# Patient Record
Sex: Female | Born: 1960 | Race: Black or African American | Hispanic: No | Marital: Married | Smoking: Never smoker
Health system: Southern US, Community
[De-identification: ages and names within clinical notes are randomized; demographics above are authoritative.]

## PROBLEM LIST (undated history)

## (undated) DIAGNOSIS — D649 Anemia, unspecified: Secondary | ICD-10-CM

## (undated) DIAGNOSIS — E559 Vitamin D deficiency, unspecified: Secondary | ICD-10-CM

## (undated) DIAGNOSIS — R0602 Shortness of breath: Secondary | ICD-10-CM

## (undated) DIAGNOSIS — R7303 Prediabetes: Secondary | ICD-10-CM

## (undated) DIAGNOSIS — Z8711 Personal history of peptic ulcer disease: Secondary | ICD-10-CM

## (undated) DIAGNOSIS — M549 Dorsalgia, unspecified: Secondary | ICD-10-CM

## (undated) DIAGNOSIS — T7840XA Allergy, unspecified, initial encounter: Secondary | ICD-10-CM

## (undated) DIAGNOSIS — M2550 Pain in unspecified joint: Secondary | ICD-10-CM

## (undated) DIAGNOSIS — E785 Hyperlipidemia, unspecified: Secondary | ICD-10-CM

## (undated) DIAGNOSIS — Z91018 Allergy to other foods: Secondary | ICD-10-CM

## (undated) DIAGNOSIS — F32A Depression, unspecified: Secondary | ICD-10-CM

## (undated) HISTORY — DX: Personal history of peptic ulcer disease: Z87.11

## (undated) HISTORY — PX: TUBAL LIGATION: SHX77

## (undated) HISTORY — DX: Allergy to other foods: Z91.018

## (undated) HISTORY — PX: COLONOSCOPY: SHX174

## (undated) HISTORY — DX: Prediabetes: R73.03

## (undated) HISTORY — PX: ENDOMETRIAL ABLATION: SHX621

## (undated) HISTORY — DX: Shortness of breath: R06.02

## (undated) HISTORY — DX: Allergy, unspecified, initial encounter: T78.40XA

## (undated) HISTORY — DX: Vitamin D deficiency, unspecified: E55.9

## (undated) HISTORY — PX: TONSILLECTOMY: SUR1361

## (undated) HISTORY — DX: Hyperlipidemia, unspecified: E78.5

## (undated) HISTORY — DX: Depression, unspecified: F32.A

## (undated) HISTORY — DX: Pain in unspecified joint: M25.50

---

## 1997-06-28 ENCOUNTER — Inpatient Hospital Stay (HOSPITAL_COMMUNITY): Admission: AD | Admit: 1997-06-28 | Discharge: 1997-06-28 | Payer: Self-pay | Admitting: Obstetrics & Gynecology

## 1997-12-09 ENCOUNTER — Inpatient Hospital Stay (HOSPITAL_COMMUNITY): Admission: AD | Admit: 1997-12-09 | Discharge: 1997-12-11 | Payer: Self-pay | Admitting: Obstetrics & Gynecology

## 1999-06-25 ENCOUNTER — Encounter: Admission: RE | Admit: 1999-06-25 | Discharge: 1999-07-02 | Payer: Self-pay | Admitting: Internal Medicine

## 2000-01-26 ENCOUNTER — Emergency Department (HOSPITAL_COMMUNITY): Admission: EM | Admit: 2000-01-26 | Discharge: 2000-01-26 | Payer: Self-pay | Admitting: Emergency Medicine

## 2000-10-31 ENCOUNTER — Inpatient Hospital Stay (HOSPITAL_COMMUNITY): Admission: AD | Admit: 2000-10-31 | Discharge: 2000-11-03 | Payer: Self-pay | Admitting: Obstetrics & Gynecology

## 2000-11-08 ENCOUNTER — Encounter: Admission: RE | Admit: 2000-11-08 | Discharge: 2000-12-08 | Payer: Self-pay | Admitting: Obstetrics and Gynecology

## 2001-02-28 ENCOUNTER — Other Ambulatory Visit: Admission: RE | Admit: 2001-02-28 | Discharge: 2001-02-28 | Payer: Self-pay | Admitting: Obstetrics & Gynecology

## 2001-05-16 ENCOUNTER — Emergency Department (HOSPITAL_COMMUNITY): Admission: EM | Admit: 2001-05-16 | Discharge: 2001-05-16 | Payer: Self-pay | Admitting: Emergency Medicine

## 2001-06-28 ENCOUNTER — Emergency Department (HOSPITAL_COMMUNITY): Admission: EM | Admit: 2001-06-28 | Discharge: 2001-06-28 | Payer: Self-pay

## 2001-06-29 ENCOUNTER — Emergency Department (HOSPITAL_COMMUNITY): Admission: EM | Admit: 2001-06-29 | Discharge: 2001-06-29 | Payer: Self-pay | Admitting: Emergency Medicine

## 2001-06-29 ENCOUNTER — Encounter: Payer: Self-pay | Admitting: Emergency Medicine

## 2001-09-19 ENCOUNTER — Encounter: Payer: Self-pay | Admitting: Orthopedic Surgery

## 2001-09-19 ENCOUNTER — Ambulatory Visit (HOSPITAL_COMMUNITY): Admission: RE | Admit: 2001-09-19 | Discharge: 2001-09-19 | Payer: Self-pay | Admitting: Orthopedic Surgery

## 2002-03-13 ENCOUNTER — Other Ambulatory Visit: Admission: RE | Admit: 2002-03-13 | Discharge: 2002-03-13 | Payer: Self-pay | Admitting: Obstetrics & Gynecology

## 2002-05-03 ENCOUNTER — Ambulatory Visit (HOSPITAL_COMMUNITY): Admission: RE | Admit: 2002-05-03 | Discharge: 2002-05-03 | Payer: Self-pay | Admitting: Obstetrics & Gynecology

## 2002-05-23 HISTORY — PX: BREAST REDUCTION SURGERY: SHX8

## 2003-03-19 ENCOUNTER — Other Ambulatory Visit: Admission: RE | Admit: 2003-03-19 | Discharge: 2003-03-19 | Payer: Self-pay | Admitting: Obstetrics & Gynecology

## 2004-03-19 ENCOUNTER — Other Ambulatory Visit: Admission: RE | Admit: 2004-03-19 | Discharge: 2004-03-19 | Payer: Self-pay | Admitting: Obstetrics & Gynecology

## 2004-09-06 ENCOUNTER — Ambulatory Visit (HOSPITAL_BASED_OUTPATIENT_CLINIC_OR_DEPARTMENT_OTHER): Admission: RE | Admit: 2004-09-06 | Discharge: 2004-09-06 | Payer: Self-pay | Admitting: Specialist

## 2004-09-06 ENCOUNTER — Ambulatory Visit (HOSPITAL_COMMUNITY): Admission: RE | Admit: 2004-09-06 | Discharge: 2004-09-06 | Payer: Self-pay | Admitting: Specialist

## 2005-04-29 ENCOUNTER — Other Ambulatory Visit: Admission: RE | Admit: 2005-04-29 | Discharge: 2005-04-29 | Payer: Self-pay | Admitting: Obstetrics & Gynecology

## 2005-05-23 HISTORY — PX: BREAST SURGERY: SHX581

## 2010-10-31 ENCOUNTER — Inpatient Hospital Stay (INDEPENDENT_AMBULATORY_CARE_PROVIDER_SITE_OTHER)
Admission: RE | Admit: 2010-10-31 | Discharge: 2010-10-31 | Disposition: A | Discharge status: Home / Self Care | Payer: 59 | Source: Ambulatory Visit | Attending: Family Medicine | Admitting: Family Medicine

## 2010-10-31 DIAGNOSIS — J069 Acute upper respiratory infection, unspecified: Secondary | ICD-10-CM

## 2010-10-31 DIAGNOSIS — H109 Unspecified conjunctivitis: Secondary | ICD-10-CM

## 2010-11-19 ENCOUNTER — Inpatient Hospital Stay (INDEPENDENT_AMBULATORY_CARE_PROVIDER_SITE_OTHER)
Admission: RE | Admit: 2010-11-19 | Discharge: 2010-11-19 | Disposition: A | Discharge status: Home / Self Care | Payer: 59 | Source: Ambulatory Visit | Attending: Emergency Medicine | Admitting: Emergency Medicine

## 2010-11-19 DIAGNOSIS — H209 Unspecified iridocyclitis: Secondary | ICD-10-CM

## 2011-04-19 ENCOUNTER — Emergency Department (INDEPENDENT_AMBULATORY_CARE_PROVIDER_SITE_OTHER)
Admission: EM | Admit: 2011-04-19 | Discharge: 2011-04-19 | Disposition: A | Discharge status: Home / Self Care | Payer: 59 | Source: Home / Self Care | Attending: Family Medicine | Admitting: Family Medicine

## 2011-04-19 ENCOUNTER — Encounter: Payer: Self-pay | Admitting: Cardiology

## 2011-04-19 DIAGNOSIS — J069 Acute upper respiratory infection, unspecified: Secondary | ICD-10-CM

## 2011-04-19 MED ORDER — GUAIFENESIN-CODEINE 100-10 MG/5ML PO SYRP: 10.0000 mL | ORAL_SOLUTION | Freq: Three times a day (TID) | ORAL | Status: AC | PRN

## 2011-04-19 MED ORDER — PSEUDOEPHEDRINE-GUAIFENESIN ER 120-1200 MG PO TB12: 120.0000 mg | ORAL_TABLET | Freq: Two times a day (BID) | ORAL | Status: DC

## 2011-04-19 MED ORDER — IPRATROPIUM BROMIDE 0.06 % NA SOLN: 2.0000 | Freq: Four times a day (QID) | NASAL | Status: DC

## 2011-04-19 NOTE — ED Provider Notes (Signed)
History     CSN: [id number] Arrival date & time: 04/19/2011  8:28 AM   First MD Initiated Contact with Patient 04/19/11 [phone]      Chief Complaint  Patient presents with  . Nasal Congestion  . Sore Throat  . Cough    (Consider location/radiation/quality/duration/timing/severity/associated sxs/prior treatment) Patient is a 50 y.o. female presenting with pharyngitis and cough. The history is provided by the patient.  Sore Throat This is a new problem. The current episode started more than 2 days ago. The problem occurs constantly. The problem has not changed since onset.The symptoms are aggravated by nothing. The symptoms are relieved by nothing. She has tried acetaminophen for the symptoms. The treatment provided no relief.  Cough Associated symptoms include rhinorrhea and sore throat.    History reviewed. No pertinent past medical history.  Past Surgical History  Procedure Date  . Breast reduction surgery 2004    bilat  . Tonsillectomy   . Cesarean section 2002  . Vaginal delivery 1999    History reviewed. No pertinent family history.  History  Substance Use Topics  . Smoking status: Never Smoker   . Smokeless tobacco: Not on file  . Alcohol Use: No    OB History    Grav Para Term Preterm Abortions TAB SAB Ect Mult Living                  Review of Systems  Constitutional: Negative.   HENT: Positive for congestion, sore throat, rhinorrhea and postnasal drip.   Respiratory: Positive for cough.   Gastrointestinal: Negative.   Skin: Negative.     Allergies  Contrast media; Shellfish allergy; Penicillins; and Sulfa antibiotics  Home Medications   Current Outpatient Rx  Name Route Sig Dispense Refill  . ONE-DAILY MULTI VITAMINS PO TABS Oral Take 1 tablet by mouth daily.      Marland Kitchen PRESCRIPTION MEDICATION Oral Take by mouth daily. Pt takes xyzol for allergies unsure of dose but may be 10 mg        BP 128/85  Pulse 81  Temp(Src) 97.9 F (36.6 C) (Oral)   Resp 20  SpO2 100%  LMP 04/17/2011  Physical Exam  Nursing note and vitals reviewed. Constitutional: She appears well-developed and well-nourished.  HENT:  Head: Normocephalic.  Right Ear: External ear normal.  Left Ear: External ear normal.  Mouth/Throat: Oropharynx is clear and moist.  Eyes: Conjunctivae and EOM are normal. Pupils are equal, round, and reactive to light.  Neck: Normal range of motion. Neck supple.  Cardiovascular: Normal rate, normal heart sounds and intact distal pulses.   Pulmonary/Chest: Effort normal and breath sounds normal.  Abdominal: Soft.  Lymphadenopathy:    She has no cervical adenopathy.  Skin: Skin is warm and dry.    ED Course  Procedures (including critical care time)  Labs Reviewed - No data to display No results found.   No diagnosis found.    MDM          Barkley Bruns, MD 04/19/11 [phone]

## 2011-04-19 NOTE — ED Notes (Signed)
Cold like symptoms since this past Friday. Cough non-productive, head congestion, and sore throat. Tired OTC cold meds with no relief.  Reports may have had fever Saturday or Sunday.

## 2013-06-07 LAB — HM COLONOSCOPY

## 2013-12-11 ENCOUNTER — Other Ambulatory Visit: Payer: Self-pay | Admitting: Obstetrics and Gynecology

## 2014-03-06 ENCOUNTER — Ambulatory Visit: Payer: 59 | Admitting: Family Medicine

## 2014-04-15 ENCOUNTER — Encounter: Payer: Self-pay | Admitting: *Deleted

## 2014-04-23 ENCOUNTER — Ambulatory Visit: Payer: 59 | Admitting: Family Medicine

## 2014-05-07 ENCOUNTER — Encounter: Payer: Self-pay | Admitting: Family Medicine

## 2014-05-07 ENCOUNTER — Ambulatory Visit (INDEPENDENT_AMBULATORY_CARE_PROVIDER_SITE_OTHER): Payer: 59 | Admitting: Family Medicine

## 2014-05-07 VITALS — BP 122/70 | HR 78 | Temp 99.0°F | Resp 14 | Ht 64.0 in | Wt 201.0 lb

## 2014-05-07 DIAGNOSIS — F3281 Premenstrual dysphoric disorder: Secondary | ICD-10-CM

## 2014-05-07 DIAGNOSIS — R7302 Impaired glucose tolerance (oral): Secondary | ICD-10-CM

## 2014-05-07 DIAGNOSIS — Z0000 Encounter for general adult medical examination without abnormal findings: Secondary | ICD-10-CM

## 2014-05-07 DIAGNOSIS — E669 Obesity, unspecified: Secondary | ICD-10-CM | POA: Insufficient documentation

## 2014-05-07 DIAGNOSIS — N943 Premenstrual tension syndrome: Secondary | ICD-10-CM

## 2014-05-07 DIAGNOSIS — E559 Vitamin D deficiency, unspecified: Secondary | ICD-10-CM

## 2014-05-07 DIAGNOSIS — J302 Other seasonal allergic rhinitis: Secondary | ICD-10-CM

## 2014-05-07 DIAGNOSIS — T7840XA Allergy, unspecified, initial encounter: Secondary | ICD-10-CM | POA: Insufficient documentation

## 2014-05-07 DIAGNOSIS — Z13220 Encounter for screening for lipoid disorders: Secondary | ICD-10-CM

## 2014-05-07 LAB — CBC WITH DIFFERENTIAL/PLATELET
BASOS PCT: 0 % (ref 0–1)
Basophils Absolute: 0 10*3/uL (ref 0.0–0.1)
EOS PCT: 1 % (ref 0–5)
Eosinophils Absolute: 0.1 10*3/uL (ref 0.0–0.7)
HCT: 32.4 % — ABNORMAL LOW (ref 36.0–46.0)
HEMOGLOBIN: 10.3 g/dL — AB (ref 12.0–15.0)
Lymphocytes Relative: 27 % (ref 12–46)
Lymphs Abs: 1.9 10*3/uL (ref 0.7–4.0)
MCH: 25.9 pg — AB (ref 26.0–34.0)
MCHC: 31.8 g/dL (ref 30.0–36.0)
MCV: 81.6 fL (ref 78.0–100.0)
MPV: 9.2 fL — ABNORMAL LOW (ref 9.4–12.4)
Monocytes Absolute: 0.7 10*3/uL (ref 0.1–1.0)
Monocytes Relative: 10 % (ref 3–12)
Neutro Abs: 4.3 10*3/uL (ref 1.7–7.7)
Neutrophils Relative %: 62 % (ref 43–77)
PLATELETS: 355 10*3/uL (ref 150–400)
RBC: 3.97 MIL/uL (ref 3.87–5.11)
RDW: 15.2 % (ref 11.5–15.5)
WBC: 7 10*3/uL (ref 4.0–10.5)

## 2014-05-07 LAB — LIPID PANEL
Cholesterol: 198 mg/dL (ref 0–200)
HDL: 53 mg/dL (ref >39)
LDL Cholesterol: 124 mg/dL — ABNORMAL HIGH (ref 0–99)
TRIGLYCERIDES: 106 mg/dL (ref <150)
Total CHOL/HDL Ratio: 3.7 Ratio
VLDL: 21 mg/dL (ref 0–40)

## 2014-05-07 LAB — COMPREHENSIVE METABOLIC PANEL
ALBUMIN: 3.9 g/dL (ref 3.5–5.2)
ALT: 17 U/L (ref 0–35)
AST: 21 U/L (ref 0–37)
Alkaline Phosphatase: 85 U/L (ref 39–117)
BUN: 11 mg/dL (ref 6–23)
CALCIUM: 9.3 mg/dL (ref 8.4–10.5)
CHLORIDE: 102 meq/L (ref 96–112)
CO2: 25 mEq/L (ref 19–32)
Creat: 0.69 mg/dL (ref 0.50–1.10)
GLUCOSE: 94 mg/dL (ref 70–99)
POTASSIUM: 4.4 meq/L (ref 3.5–5.3)
Sodium: 137 mEq/L (ref 135–145)
Total Bilirubin: 0.5 mg/dL (ref 0.2–1.2)
Total Protein: 7.5 g/dL (ref 6.0–8.3)

## 2014-05-07 LAB — HEMOGLOBIN A1C
Hgb A1c MFr Bld: 6.5 % — ABNORMAL HIGH (ref <5.7)
Mean Plasma Glucose: 140 mg/dL — ABNORMAL HIGH (ref <117)

## 2014-05-07 MED ORDER — LEVOCETIRIZINE DIHYDROCHLORIDE 5 MG PO TABS: 5.0000 mg | ORAL_TABLET | Freq: Every evening | ORAL | Status: DC

## 2014-05-07 MED ORDER — L-METHYLFOLATE-B6-B12 3-35-2 MG PO TABS: 1.0000 | ORAL_TABLET | Freq: Every day | ORAL | Status: DC

## 2014-05-07 MED ORDER — OMEGA-3-ACID ETHYL ESTERS 1 G PO CAPS: 1.0000 g | ORAL_CAPSULE | Freq: Every day | ORAL | Status: DC

## 2014-05-07 NOTE — Assessment & Plan Note (Signed)
Restart xyzal has worked well for her in the past

## 2014-05-07 NOTE — Patient Instructions (Addendum)
Release of records- Dr. Loreta AveMann- Colonoscopy  Dr. Normand Sloopillard- OB/GYN- last PAP   Dr. Renae GlossShelton - all records Xyzal sent We will call with lab results F/U as needed or in 1 year

## 2014-05-07 NOTE — Assessment & Plan Note (Signed)
-   Continue SSRI 

## 2014-05-07 NOTE — Assessment & Plan Note (Signed)
Work on dietary changes and weight loss, admits she is not consistent with this

## 2014-05-07 NOTE — Progress Notes (Signed)
[name], female   DOB: 1961/02/02, 53 y.o.   MRN: [id number]   Subjective:    [name], female    DOB: 1961/02/02, 53 y.o.   MRN: [id number]  Patient presents for New Patient Establish Care and Allergies   [name], [name], [name], [name], [name]  Due for fasting labs History of glucose intolerance, has been trying to watch her wait, also mild hyperlipidemia, has shellfish allergy but able to tolerate Lovaza once a day  Allergies- did better on xyzal but off of medication for past 6 months due to change in provider, has been trying zyrtec and nasonex with no improvement in symptoms, has a a dry hacky cough, sneezing, irritated eyes  Family history reviewed- most of family has passed away from some type of cancer,, she is an only child, has 2 children, her daughter age 413 has hypotonia from birth, has vent at night, they have nursing to assist, 52 y.o old son is healthy.  PMDD- seen by GYN has had mood swings, irritability for past 2 years, also has difficulty with focus, was tried on ADHD meds- adderall but had SE from them, seen by GYN started on prozac and is doing much better.    Work in cath lab at [employer]    Review Of Systems: per above  GEN- denies fatigue, fever, weight loss,weakness, recent illness HEENT- denies eye drainage, change in vision, nasal discharge, CVS- denies chest pain, palpitations RESP- denies SOB, cough, wheeze ABD- denies N/V, change in stools, abd pain GU- denies dysuria, hematuria, dribbling, incontinence MSK- denies joint pain, muscle aches, injury Neuro- denies headache, dizziness, syncope, seizure activity       Objective:    BP 122/70 mmHg  Pulse 78  Temp(Src) 99 F (37.2 C) (Oral)  Resp 14  Ht 5\' 4"  (1.626 m)  Wt 201 lb (91.173 kg)  BMI 34.48 kg/m2  LMP 05/03/2014 (Exact Date) GEN- NAD, alert  and oriented x3 HEENT- PERRL, EOMI, non injected sclera, pink conjunctiva, MMM, oropharynx clear, TM clear bilat, no effusion Neck- Supple, no thyromegaly CVS- RRR, soft systolic heart murmur 2/6 LSB RESP-CTAB ABD-NABS,soft,NT,ND Psych- normal affect and mood EXT- No edema Pulses- Radial, DP- 2+        Assessment & Plan:      Problem List Items Addressed This Visit      Unprioritized   PMDD (premenstrual dysphoric disorder)   Obesity    Other Visit Diagnoses    Routine general medical examination at a health care facility    -  Primary    Fasting labs, immunizations UTD, GYN UTD, colon screening UTD, obtain records    Relevant Orders       CBC with Differential       Comprehensive metabolic panel       Lipid panel       TSH       Vitamin D, 25-hydroxy    Vitamin D deficiency        Relevant Orders       Vitamin D, 25-hydroxy    Lipid screening        Relevant Orders       Lipid panel    Glucose intolerance (impaired glucose tolerance)        Relevant Orders       Hemoglobin A1c  Note: This dictation was prepared with Dragon dictation along with smaller phrase technology. Any transcriptional errors that result from this process are unintentional.

## 2014-05-08 LAB — VITAMIN D 25 HYDROXY (VIT D DEFICIENCY, FRACTURES): Vit D, 25-Hydroxy: 40 ng/mL (ref 30–100)

## 2014-05-08 LAB — TSH: TSH: 1.078 u[IU]/mL (ref 0.350–4.500)

## 2014-07-02 ENCOUNTER — Encounter: Payer: Self-pay | Admitting: Family Medicine

## 2014-07-02 ENCOUNTER — Ambulatory Visit (INDEPENDENT_AMBULATORY_CARE_PROVIDER_SITE_OTHER): Payer: 59 | Admitting: Family Medicine

## 2014-07-02 VITALS — BP 124/68 | HR 76 | Temp 99.1°F | Resp 14 | Ht 64.0 in | Wt 205.0 lb

## 2014-07-02 DIAGNOSIS — J0100 Acute maxillary sinusitis, unspecified: Secondary | ICD-10-CM

## 2014-07-02 DIAGNOSIS — H1012 Acute atopic conjunctivitis, left eye: Secondary | ICD-10-CM

## 2014-07-02 MED ORDER — LEVOFLOXACIN 750 MG PO TABS: 750.0000 mg | ORAL_TABLET | Freq: Every day | ORAL | Status: DC

## 2014-07-02 NOTE — Patient Instructions (Signed)
Take the antibiotics as prescribed Use your drops as prescribed We will change your allergy medication to brand name.

## 2014-07-02 NOTE — Progress Notes (Signed)
[name], female   DOB: 03-07-1961, 54 y.o.   MRN: [id number]   Subjective:    Patient ID: Victoria RaringMarsha R Jarboe, female    DOB: 03-07-1961, 54 y.o.   MRN: [id number]  Patient presents for L Eye Irritation  here with worsening allergies as well as erythema to her left eye. She states that this occurs sometimes twice a year and has been doing this for the past couple years for her allergies are uncontrolled on of her eyes will turn red and began to water and itch. She's been treated in the past with topical antibiotic drops but these do not help only treating her sinuses and changing her Joan Floreslley medicine helps. She did try using some Visine in some topical drops that she had at home the past few days it has not changed her eye. She overall does not feel bad she's had a lot of congestion and sneezing and drainage over the past couple weeks. She is taking her Xyzal as prescribed but does not think it is working as well.    Review Of Systems:  GEN- denies fatigue, fever, weight loss,weakness, recent illness HEENT- denies eye drainage, change in vision, nasal discharge, CVS- denies chest pain, palpitations RESP- denies SOB, cough, wheeze ABD- denies N/V, change in stools, abd pain GU- denies dysuria, hematuria, dribbling, incontinence MSK- denies joint pain, muscle aches, injury Neuro- denies headache, dizziness, syncope, seizure activity       Objective:    BP 124/68 mmHg  Pulse 76  Temp(Src) 99.1 F (37.3 C) (Oral)  Resp 14  Ht 5\' 4"  (1.626 m)  Wt 205 lb (92.987 kg)  BMI 35.17 kg/m2  LMP 06/02/2014 (Within Days) GEN- NAD, alert and oriented x3 HEENT- PERRL, EOMI, + injected left sclera,, right clear pink conjunctiva, MMM, oropharynx clear, TM clear bilat no effusion,  + mild  maxillary sinus tenderness, inflammed turbinates,  Nasal drainage  Neck- Supple, no LAD CVS- RRR, no murmur RESP-CTAB EXT- No edema Pulses- Radial 2+         Assessment & Plan:       Problem List Items Addressed This Visit    None    Visit Diagnoses    Acute maxillary sinusitis, recurrence not specified    -  Primary    Treat with levaquin, OTC mucous relief, change allergy medication to claritin if brand name Xyzal to expensive    Relevant Medications    levofloxacin (LEVAQUIN) tablet    Allergic conjunctivitis, left        per above, not bacterial conjunctivitis       Note: This dictation was prepared with Dragon dictation along with smaller phrase technology. Any transcriptional errors that result from this process are unintentional.

## 2014-07-03 ENCOUNTER — Ambulatory Visit: Payer: 59 | Admitting: Family Medicine

## 2014-07-13 ENCOUNTER — Encounter (HOSPITAL_COMMUNITY): Payer: Self-pay | Admitting: *Deleted

## 2014-07-13 ENCOUNTER — Emergency Department (HOSPITAL_COMMUNITY)
Admission: EM | Admit: 2014-07-13 | Discharge: 2014-07-13 | Disposition: A | Discharge status: Home / Self Care | Payer: 59 | Source: Home / Self Care | Attending: Family Medicine | Admitting: Family Medicine

## 2014-07-13 DIAGNOSIS — S61011A Laceration without foreign body of right thumb without damage to nail, initial encounter: Secondary | ICD-10-CM

## 2014-07-13 NOTE — ED Notes (Addendum)
Assessment per Dr. Denyse Amassorey.  Distal right thumb avulsion from mandolin slicer.  Pt will check with Employee Health tomorrow RE: Tdap status.

## 2014-07-13 NOTE — ED Notes (Signed)
Dermabond applied per Dr. Denyse Amassorey.

## 2014-07-13 NOTE — Discharge Instructions (Signed)
Thank you for coming in today. Return as needed Contact employee health tomorrow and see when her last tetanus vaccination was. If it was more than 5 years ago please return to clinic or present to employee health for a new tetanus vaccination.   Finger Avulsion  When the tip of the finger is lost, a new nail may grow back if part of the fingernail is left. The new nail may be deformed. If just the tip of the finger is lost, no repair may be needed unless there is bone showing. If bone is showing, your caregiver may need to remove the protruding bone and put on a bandage. Your caregiver will do what is best for you. Most of the time when a fingertip is lost, the end will gradually grow back on and look fairly normal, but it may remain sensitive to pressure and temperature extremes for a long time. HOME CARE INSTRUCTIONS   Keep your hand elevated above your heart to relieve pain and swelling.  Keep your dressing dry and clean.  Change your bandage in 24 hours or as directed.  Only take over-the-counter or prescription medicines for pain, discomfort, or fever as directed by your caregiver.  See your caregiver as needed for problems. SEEK MEDICAL CARE IF:   You have increased pain, swelling, drainage, or bleeding.  You have a fever.  You have swelling that spreads from your finger and into your hand. Make sure to check to see if you need a tetanus booster. Document Released: 07/18/2001 Document Revised: 11/08/2011 Document Reviewed: 06/12/2008 [employer] Patient Information 2015 [employer] [place]. This information is not intended to replace advice given to you by your health care provider. Make sure you discuss any questions you have with your health care provider.

## 2014-07-13 NOTE — ED Provider Notes (Signed)
Victoria RaringMarsha R Garcia is a 54 y.o. female who presents to Urgent Care today for right thumb laceration. Patient was using a mandolin slicer at home this evening when she accidentally cut a piece of her thumb off. She irrigated the wound and applied compression. 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 She thinks her last tetanus vaccine was less than 5 years ago.    Past Medical History  Diagnosis Date  . Allergy     SEASONAL   Past Surgical History  Procedure Laterality Date  . Breast reduction surgery  2004    bilat  . Tonsillectomy    . Cesarean section  2002  . Vaginal delivery  1999  . Breast surgery  2007    BREAST REDUCTION  . Tubal ligation     History  Substance Use Topics  . Smoking status: Never Smoker   . Smokeless tobacco: Never Used  . Alcohol Use: No   ROS as above Medications: No current facility-administered medications for this encounter.   Current Outpatient Prescriptions  Medication Sig Dispense Refill  . Cholecalciferol (VITAMIN D) 2000 UNITS tablet Take 2,000 Units by mouth daily.    . Fluoxetine HCl, PMDD, (SARAFEM) 20 MG TABS Take 1 tablet by mouth at bedtime.    Marland Kitchen. ibuprofen (ADVIL,MOTRIN) 600 MG tablet Take 600 mg by mouth every 6 (six) hours as needed.    Marland Kitchen. l-methylfolate-B6-B12 (METANX) 3-35-2 MG TABS Take 1 tablet by mouth daily. 30 tablet 11  . Multiple Vitamin (MULTIVITAMIN) tablet Take 1 tablet by mouth daily.      Marland Kitchen. omega-3 acid ethyl esters (LOVAZA) 1 G capsule Take 1 capsule (1 g total) by mouth daily. 30 capsule 11   Allergies  Allergen Reactions  . Contrast Media [Iodinated Diagnostic Agents] Swelling  . Shellfish Allergy Swelling  . Penicillins Rash  . Sulfa Antibiotics Rash     Exam:  BP 144/87 mmHg  Pulse 82  Temp(Src) 99.5 F (37.5 C) (Oral)  Resp 16  SpO2 97%  LMP 06/02/2014 (Within Days)  Gen. well-appearing Right thumb avulsion type laceration to the finger pad. Bleeding.  Laceration  repair: Consent obtained and timeout performed. Tourniquet applied and hemostasis achieved. A large Dermabond was applied achieving a good Over top of the laceration. A dressing was then applied. Tourniquet removed. Total tourniquet time was less than 5 minutes.  No results found for this or any previous visit (from the past 24 hour(s)). No results found.  Assessment and Plan: 54 y.o. female with fingertip avulsion. Dermabond applied. Patient will investigate her last tetanus status and return for tetanus vaccine if needed. Follow-up with PCP as needed.  Discussed warning signs or symptoms. Please see discharge instructions. Patient expresses understanding.     Rodolph BongEvan S Rox Mcgriff, MD 07/13/14 [phone]

## 2014-08-19 ENCOUNTER — Encounter: Payer: Self-pay | Admitting: Family Medicine

## 2014-09-22 ENCOUNTER — Ambulatory Visit: Payer: Self-pay | Admitting: Family Medicine

## 2014-09-24 ENCOUNTER — Encounter: Payer: Self-pay | Admitting: Family Medicine

## 2014-09-24 ENCOUNTER — Ambulatory Visit (INDEPENDENT_AMBULATORY_CARE_PROVIDER_SITE_OTHER): Payer: 59 | Admitting: Family Medicine

## 2014-09-24 VITALS — BP 128/64 | HR 62 | Temp 99.0°F | Resp 14 | Ht 64.0 in | Wt 204.0 lb

## 2014-09-24 DIAGNOSIS — R7302 Impaired glucose tolerance (oral): Secondary | ICD-10-CM | POA: Diagnosis not present

## 2014-09-24 DIAGNOSIS — D509 Iron deficiency anemia, unspecified: Secondary | ICD-10-CM

## 2014-09-24 DIAGNOSIS — E669 Obesity, unspecified: Secondary | ICD-10-CM

## 2014-09-24 HISTORY — DX: Iron deficiency anemia, unspecified: D50.9

## 2014-09-24 LAB — BASIC METABOLIC PANEL
BUN: 19 mg/dL (ref 6–23)
CHLORIDE: 102 meq/L (ref 96–112)
CO2: 28 meq/L (ref 19–32)
Calcium: 9.1 mg/dL (ref 8.4–10.5)
Creat: 0.64 mg/dL (ref 0.50–1.10)
GLUCOSE: 88 mg/dL (ref 70–99)
POTASSIUM: 4.3 meq/L (ref 3.5–5.3)
Sodium: 138 mEq/L (ref 135–145)

## 2014-09-24 LAB — CBC WITH DIFFERENTIAL/PLATELET
BASOS ABS: 0 10*3/uL (ref 0.0–0.1)
Basophils Relative: 0 % (ref 0–1)
EOS ABS: 0.1 10*3/uL (ref 0.0–0.7)
Eosinophils Relative: 2 % (ref 0–5)
HCT: 38.4 % (ref 36.0–46.0)
HEMOGLOBIN: 12.5 g/dL (ref 12.0–15.0)
LYMPHS ABS: 2.4 10*3/uL (ref 0.7–4.0)
Lymphocytes Relative: 32 % (ref 12–46)
MCH: 28.7 pg (ref 26.0–34.0)
MCHC: 32.6 g/dL (ref 30.0–36.0)
MCV: 88.3 fL (ref 78.0–100.0)
MONOS PCT: 7 % (ref 3–12)
MPV: 10.2 fL (ref 8.6–12.4)
Monocytes Absolute: 0.5 10*3/uL (ref 0.1–1.0)
NEUTROS PCT: 59 % (ref 43–77)
Neutro Abs: 4.4 10*3/uL (ref 1.7–7.7)
PLATELETS: 270 10*3/uL (ref 150–400)
RBC: 4.35 MIL/uL (ref 3.87–5.11)
RDW: 14 % (ref 11.5–15.5)
WBC: 7.4 10*3/uL (ref 4.0–10.5)

## 2014-09-24 LAB — HEMOGLOBIN A1C, FINGERSTICK: HEMOGLOBIN A1C, FINGERSTICK: 5.9 % — AB (ref <5.7)

## 2014-09-24 LAB — IRON: Iron: 50 ug/dL (ref 42–145)

## 2014-09-24 NOTE — Patient Instructions (Signed)
Continue current vitamins and supplements Continue to work on weight loss and healthy eating F/U as needed or end of Dec for physical

## 2014-09-24 NOTE — Assessment & Plan Note (Signed)
A1c in the office return of 5.9% which is excellent. She is not diabetic. She will continue to work on weight loss with her dietary changes and exercise program

## 2014-09-24 NOTE — Progress Notes (Signed)
[name], female   DOB: 04/09/1961, 54 y.o.   MRN: [id number]   Subjective:    [name], female    DOB: 04/09/1961, 54 y.o.   MRN: [id number]  Patient presents for DM F/U  Patient here for labs. She has history of glucose intolerance last vomited her labs her A1c returned at 6.5% she is mostly working on dietary changes and weight loss. She had to have labs redone to try to enter a wellness program with her job however they told her that she did not qualify and she was confused. She wanted to repeat A1c done today as well as her hemoglobin level. She has not been able to tolerate iron every day but takes it every other day because of constipation. Note she is no longer taking the fluoxetine  Review Of Systems:  GEN- denies fatigue, fever, weight loss,weakness, recent illness HEENT- denies eye drainage, change in vision, nasal discharge, CVS- denies chest pain, palpitations RESP- denies SOB, cough, wheeze ABD- denies N/V, change in stools, abd pain GU- denies dysuria, hematuria, dribbling, incontinence MSK- denies joint pain, muscle aches, injury Neuro- denies headache, dizziness, syncope, seizure activity       Objective:    BP 128/64 mmHg  Pulse 62  Temp(Src) 99 F (37.2 C) (Oral)  Resp 14  Ht 5\' 4"  (1.626 m)  Wt 204 lb (92.534 kg)  BMI 35.00 kg/m2  LMP 09/16/2014 (Approximate) GEN- NAD, alert and oriented x3         Assessment & Plan:      Problem List Items Addressed This Visit    Glucose intolerance (impaired glucose tolerance)   Relevant Orders   CBC with Differential/Platelet (Completed)   Basic metabolic panel (Completed)   Hemoglobin A1C, fingerstick (Completed)   Anemia, iron deficiency - Primary   Relevant Medications   ferrous sulfate 325 (65 FE) MG EC tablet   Other Relevant Orders   CBC with Differential/Platelet (Completed)   Iron (Completed)   Vitamin B12 (Completed)      Note: This dictation was prepared  with Dragon dictation along with smaller phrase technology. Any transcriptional errors that result from this process are unintentional.

## 2014-09-25 LAB — VITAMIN B12: Vitamin B-12: >2000 pg/mL — ABNORMAL HIGH (ref 211–911)

## 2014-10-06 ENCOUNTER — Ambulatory Visit: Payer: 59 | Admitting: Family Medicine

## 2015-05-19 ENCOUNTER — Encounter: Payer: 59 | Admitting: Family Medicine

## 2015-06-04 DIAGNOSIS — Z01419 Encounter for gynecological examination (general) (routine) without abnormal findings: Secondary | ICD-10-CM | POA: Diagnosis not present

## 2015-06-04 DIAGNOSIS — Z124 Encounter for screening for malignant neoplasm of cervix: Secondary | ICD-10-CM | POA: Diagnosis not present

## 2015-06-04 DIAGNOSIS — N921 Excessive and frequent menstruation with irregular cycle: Secondary | ICD-10-CM | POA: Diagnosis not present

## 2015-06-05 LAB — HM PAP SMEAR

## 2015-06-15 ENCOUNTER — Encounter: Payer: Self-pay | Admitting: Family Medicine

## 2015-06-15 ENCOUNTER — Ambulatory Visit (INDEPENDENT_AMBULATORY_CARE_PROVIDER_SITE_OTHER): Payer: 59 | Admitting: Family Medicine

## 2015-06-15 ENCOUNTER — Encounter: Payer: Self-pay | Admitting: *Deleted

## 2015-06-15 VITALS — BP 130/74 | HR 78 | Temp 98.4°F | Resp 14 | Ht 64.0 in | Wt 201.0 lb

## 2015-06-15 DIAGNOSIS — Z0000 Encounter for general adult medical examination without abnormal findings: Secondary | ICD-10-CM | POA: Diagnosis not present

## 2015-06-15 DIAGNOSIS — E538 Deficiency of other specified B group vitamins: Secondary | ICD-10-CM

## 2015-06-15 DIAGNOSIS — E669 Obesity, unspecified: Secondary | ICD-10-CM

## 2015-06-15 DIAGNOSIS — R7302 Impaired glucose tolerance (oral): Secondary | ICD-10-CM | POA: Diagnosis not present

## 2015-06-15 DIAGNOSIS — M549 Dorsalgia, unspecified: Secondary | ICD-10-CM

## 2015-06-15 DIAGNOSIS — Z1159 Encounter for screening for other viral diseases: Secondary | ICD-10-CM

## 2015-06-15 DIAGNOSIS — G8929 Other chronic pain: Secondary | ICD-10-CM | POA: Diagnosis not present

## 2015-06-15 DIAGNOSIS — D509 Iron deficiency anemia, unspecified: Secondary | ICD-10-CM

## 2015-06-15 DIAGNOSIS — Z114 Encounter for screening for human immunodeficiency virus [HIV]: Secondary | ICD-10-CM

## 2015-06-15 LAB — CBC WITH DIFFERENTIAL/PLATELET
BASOS PCT: 0 % (ref 0–1)
Basophils Absolute: 0 10*3/uL (ref 0.0–0.1)
EOS ABS: 0.2 10*3/uL (ref 0.0–0.7)
EOS PCT: 2 % (ref 0–5)
HCT: 40.2 % (ref 36.0–46.0)
Hemoglobin: 12.8 g/dL (ref 12.0–15.0)
Lymphocytes Relative: 28 % (ref 12–46)
Lymphs Abs: 2.3 10*3/uL (ref 0.7–4.0)
MCH: 29.3 pg (ref 26.0–34.0)
MCHC: 31.8 g/dL (ref 30.0–36.0)
MCV: 92 fL (ref 78.0–100.0)
MONO ABS: 0.5 10*3/uL (ref 0.1–1.0)
MONOS PCT: 6 % (ref 3–12)
MPV: 10.2 fL (ref 8.6–12.4)
Neutro Abs: 5.3 10*3/uL (ref 1.7–7.7)
Neutrophils Relative %: 64 % (ref 43–77)
PLATELETS: 276 10*3/uL (ref 150–400)
RBC: 4.37 MIL/uL (ref 3.87–5.11)
RDW: 13.5 % (ref 11.5–15.5)
WBC: 8.3 10*3/uL (ref 4.0–10.5)

## 2015-06-15 LAB — COMPREHENSIVE METABOLIC PANEL
ALK PHOS: 80 U/L (ref 33–130)
ALT: 18 U/L (ref 6–29)
AST: 17 U/L (ref 10–35)
Albumin: 4.1 g/dL (ref 3.6–5.1)
BILIRUBIN TOTAL: 0.6 mg/dL (ref 0.2–1.2)
BUN: 17 mg/dL (ref 7–25)
CALCIUM: 9.4 mg/dL (ref 8.6–10.4)
CO2: 26 mmol/L (ref 20–31)
Chloride: 102 mmol/L (ref 98–110)
Creat: 0.68 mg/dL (ref 0.50–1.05)
Glucose, Bld: 104 mg/dL — ABNORMAL HIGH (ref 70–99)
POTASSIUM: 4.4 mmol/L (ref 3.5–5.3)
SODIUM: 140 mmol/L (ref 135–146)
TOTAL PROTEIN: 7.5 g/dL (ref 6.1–8.1)

## 2015-06-15 LAB — HIV ANTIBODY (ROUTINE TESTING W REFLEX): HIV: NONREACTIVE

## 2015-06-15 LAB — HEMOGLOBIN A1C
HEMOGLOBIN A1C: 6 % — AB (ref <5.7)
Mean Plasma Glucose: 126 mg/dL — ABNORMAL HIGH (ref <117)

## 2015-06-15 LAB — VITAMIN B12: Vitamin B-12: >2000 pg/mL — ABNORMAL HIGH (ref 211–911)

## 2015-06-15 LAB — LIPID PANEL
CHOLESTEROL: 198 mg/dL (ref 125–200)
HDL: 39 mg/dL — ABNORMAL LOW (ref >=46)
LDL CALC: 136 mg/dL — AB (ref <130)
TRIGLYCERIDES: 113 mg/dL (ref <150)
Total CHOL/HDL Ratio: 5.1 Ratio — ABNORMAL HIGH (ref <=5.0)
VLDL: 23 mg/dL (ref <30)

## 2015-06-15 LAB — TSH: TSH: 1.165 u[IU]/mL (ref 0.350–4.500)

## 2015-06-15 NOTE — Progress Notes (Signed)
[name], female   DOB: [birth year], 55 y.o.   MRN: [id number]    Subjective:    [name], female    DOB: 1961-02-23, 55 y.o.   MRN: [id number]  Patient presents for CPE- no PAP  agent here for complete physical exam. She is followed by GYN Dr. Normand Sloop who does her Pap smear and her mammogram. She is having difficulty with dysfunctional uterine bleeding she is set up to have hysterosonogram done and possible ablation this year.  She has had 2 falls in the past couple years her last was this summer when she slipped at home. Since then she has had back pain on and off especially when she stands at work which she typically stands about 8-10 hours. She tries to work out if she does a lot of weights above head she will get a strain down in her left lower back that we'll go into her buttocks. She's not had her back evaluated. She denies any tingling numbness in her feet denies any change in bowel or bladder. She will take ibuprofen or Aleve this helps minimally she will also use a heating pad which helps. She also states that she feels stiff if she is sitting long. To time in that same region.  Colonoscopy UTD  Review Of Systems:  GEN- denies fatigue, fever, weight loss,weakness, recent illness HEENT- denies eye drainage, change in vision, nasal discharge, CVS- denies chest pain, palpitations RESP- denies SOB, cough, wheeze ABD- denies N/V, change in stools, abd pain GU- denies dysuria, hematuria, dribbling, incontinence MSK- denies joint pain, muscle aches, injury Neuro- denies headache, dizziness, syncope, seizure activity       Objective:    BP 130/74 mmHg  Pulse 78  Temp(Src) 98.4 F (36.9 C) (Oral)  Resp 14  Ht  (1.626 m)  Wt 201 lb (91.173 kg)  BMI 34.48 kg/m2  LMP 04/23/2015 (Approximate) GEN- NAD, alert and oriented x3 HEENT- PERRL, EOMI, non injected sclera, pink conjunctiva, MMM, oropharynx clear Neck- Supple, no thyromegaly CVS-  RRR, no murmur RESP-CTAB ABD-NABS,soft,NT,ND EXT- No edema Pulses- Radial, DP- 2+        Assessment & Plan:      Problem List Items Addressed This Visit    Obesity   Glucose intolerance (impaired glucose tolerance)   Relevant Orders   Hemoglobin A1c   Anemia, iron deficiency    Other Visit Diagnoses    Routine general medical examination at a health care facility    -  Primary    CPE done, obtain colonoscopy report and GYN report. fASTING Labs today and Hep C/HIV screening    Relevant Orders    CBC with Differential/Platelet    Comprehensive metabolic panel    Lipid panel    TSH    Vitamin B 12 deficiency        Relevant Orders    Vitamin B12    Need for hepatitis C screening test        Relevant Orders    Hepatitis C Antibody    Screening for HIV (human immunodeficiency virus)        Relevant Orders    HIV antibody    Chronic back pain        Multiple falls now with increased pain and stiffness, obtain XRAY of spine     Relevant Orders    DG Lumbar Spine Complete       Note: This dictation was prepared with Dragon dictation along  with smaller phrase technology. Any transcriptional errors that result from this process are unintentional.

## 2015-06-15 NOTE — Patient Instructions (Addendum)
I recommend eye visit once a year I recommend dental visit every 6 months Goal is to  Exercise 30 minutes 5 days a week We will send a letter with lab results  Xray of back  F/U as needed in 1 year for Physical

## 2015-06-16 ENCOUNTER — Ambulatory Visit (HOSPITAL_COMMUNITY)
Admission: RE | Admit: 2015-06-16 | Discharge: 2015-06-16 | Disposition: A | Discharge status: Home / Self Care | Payer: 59 | Source: Ambulatory Visit | Attending: Family Medicine | Admitting: Family Medicine

## 2015-06-16 DIAGNOSIS — M47896 Other spondylosis, lumbar region: Secondary | ICD-10-CM | POA: Diagnosis not present

## 2015-06-16 DIAGNOSIS — G8929 Other chronic pain: Secondary | ICD-10-CM | POA: Diagnosis not present

## 2015-06-16 DIAGNOSIS — M545 Low back pain: Secondary | ICD-10-CM | POA: Diagnosis not present

## 2015-06-16 DIAGNOSIS — M549 Dorsalgia, unspecified: Secondary | ICD-10-CM

## 2015-06-16 DIAGNOSIS — M47816 Spondylosis without myelopathy or radiculopathy, lumbar region: Secondary | ICD-10-CM | POA: Diagnosis not present

## 2015-06-16 LAB — HEPATITIS C ANTIBODY: HCV AB: NEGATIVE

## 2015-06-18 ENCOUNTER — Other Ambulatory Visit: Payer: Self-pay | Admitting: *Deleted

## 2015-06-18 DIAGNOSIS — M47817 Spondylosis without myelopathy or radiculopathy, lumbosacral region: Secondary | ICD-10-CM

## 2015-06-18 DIAGNOSIS — M5136 Other intervertebral disc degeneration, lumbar region: Secondary | ICD-10-CM

## 2015-06-19 ENCOUNTER — Telehealth: Payer: Self-pay | Admitting: *Deleted

## 2015-06-19 NOTE — Telephone Encounter (Signed)
Received call from patient.   States that she requires letter from MD stating that she requires lightweight lead apron d/t issues with her back.   Requested to have letter faxed to [facility] @ [phone].  Letter printed.

## 2015-07-01 ENCOUNTER — Other Ambulatory Visit: Payer: Self-pay | Admitting: Obstetrics and Gynecology

## 2015-07-01 DIAGNOSIS — N921 Excessive and frequent menstruation with irregular cycle: Secondary | ICD-10-CM | POA: Diagnosis not present

## 2015-07-22 ENCOUNTER — Encounter: Payer: Self-pay | Admitting: *Deleted

## 2015-08-05 DIAGNOSIS — Z1231 Encounter for screening mammogram for malignant neoplasm of breast: Secondary | ICD-10-CM | POA: Diagnosis not present

## 2015-10-26 ENCOUNTER — Telehealth: Payer: Self-pay | Admitting: Family Medicine

## 2015-10-26 DIAGNOSIS — R7302 Impaired glucose tolerance (oral): Secondary | ICD-10-CM

## 2015-10-26 NOTE — Telephone Encounter (Signed)
Referral orders placed

## 2015-10-26 NOTE — Telephone Encounter (Signed)
MD please advise

## 2015-10-26 NOTE — Telephone Encounter (Signed)
Okay to place nutrition referral

## 2015-10-26 NOTE — Telephone Encounter (Signed)
Pt is requesting a referral to a nutritionist within the [facility] for weight loss. Please advise if she will need an OV [phone]

## 2015-11-02 ENCOUNTER — Encounter: Payer: Self-pay | Admitting: Dietician

## 2015-11-02 ENCOUNTER — Encounter: Payer: 59 | Attending: Family Medicine | Admitting: Dietician

## 2015-11-02 DIAGNOSIS — E7800 Pure hypercholesterolemia, unspecified: Secondary | ICD-10-CM | POA: Insufficient documentation

## 2015-11-02 DIAGNOSIS — E669 Obesity, unspecified: Secondary | ICD-10-CM | POA: Insufficient documentation

## 2015-11-02 NOTE — Patient Instructions (Addendum)
-  Work on stress eating -Work on breaking the afternoon snack habit -Do something you enjoy instead of mindlessly snacking: walking with music, a project that uses both hands  -Continue to include fruit or nuts with lunch shake  -Practice mindful eating  -Pre portion snacks  -Eat slowly without distraction  -Continue eating regular, balanced meals and snacks  -Avoid buying chips and other "trigger foods"  -Keep a food log for a few days to help hold you accountable

## 2015-11-02 NOTE — Progress Notes (Signed)
  Medical Nutrition Therapy:  Appt start time: 0815 end time:  0915   Assessment:  Primary concerns today: Victoria Garcia is here today referred for elevated HgbA1c (6.0%); also has high cholesterol. The patient states that she would like to discuss weight and overall healthy diet. She reports that she has always struggled with her weight and has been on many diets throughout her life. She struggles to stay below 200 lbs. Highest weight: 220 lbs. Has recently lost several pounds following Isagenix diet/protein shakes. She feels like it is not sustainable to continue to replace up to 2 meals with a protein shake. Notices abdominal distention and fatigue when she eats certain types of carbs. 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 She lives with her husband and 2 teenaged children. Her husband does the grocery shopping and cooking.        Preferred Learning Style:   No preference indicated   Learning Readiness:   Ready   MEDICATIONS: multivitamin pack with fish oil and CoQ10   DIETARY INTAKE:  Usual eating pattern includes 3 meals and 3-4 snacks per day. Avoided foods include a lot of beef.    24-hr recall:   Wakes up at 6am  B (6:40 AM): Isagenix protein shake  Snk (9-10 AM): sometimes almonds or 1/2 protein bar or trail mix  L (11:30-1 PM): protein shake and an apple OR salad from cafeteria with cheese, fruit, nuts, and meat Snk (3 PM): sometimes nuts/yogurt or apple Snk (5:30 PM): yogurt on the way home from work Snk (6:15 PM): usually chips or nuts or crackers (eats mindlessly out of habit) D (7-8 PM): grilled chicken, pork chop, or [place]urkey with a vegetable and starch Snk ( PM): chips or popcorn or nuts or occasionally ice cream or cake (more desserts on weekends)  Goes to bed around 11:30=12:15  Beverages: 1 soda or tea per day, 30 oz water  Usual physical activity: 3 days (cardio, pilates, walking several miles)  Estimated energy needs: 1600-1800 calories 180-200 g  carbohydrates 120-135 g protein 44-50 g fat  Progress Towards Goal(s):  In progress.   Nutritional Diagnosis:  [place]-3.3 Overweight/obesity As related to history of dieting and erratic meal patterns and excess energy intake .  As evidenced by BMI 34.2.    Intervention:  Nutrition counseling provided. Goals: -Work on stress eating -Work on breaking the afternoon snack habit -Do something you enjoy instead of mindlessly snacking: walking with music, a project that uses both hands -Continue to include fruit or nuts with lunch shake -Practice mindful eating  -Pre portion snacks  -Eat slowly without distraction -Continue eating regular, balanced meals and snacks -Avoid buying chips and other "trigger foods" -Keep a food log for a few days to help hold you accountable  Teaching Method Utilized:  Visual Auditory Hands on  Handouts given during visit include:  Meal planning cards  MyPlate  Snack list  Barriers to learning/adherence to lifestyle change: none  Demonstrated degree of understanding via:  Teach Back   Monitoring/Evaluation:  Dietary intake, exercise, and body weight prn.

## 2016-01-12 ENCOUNTER — Ambulatory Visit: Payer: 59 | Admitting: Dietician

## 2016-02-19 IMAGING — DX DG LUMBAR SPINE COMPLETE 4+V
5 series · 5 of 5 positions shown · non-contrast
Comparison: None.

CLINICAL DATA: Chronic low back pain following a fall on the ice 1
year ago. Recent fall 3 weeks ago.

EXAM:
LUMBAR SPINE - COMPLETE 4+ VIEW

[t lumbar spine ap]
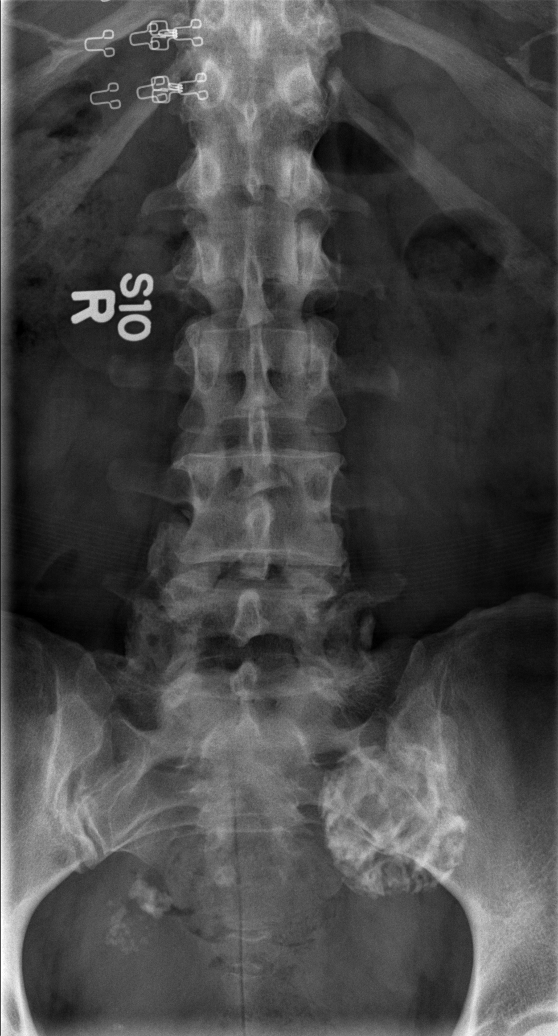

[t lumbar spine obl (1 of 2)]
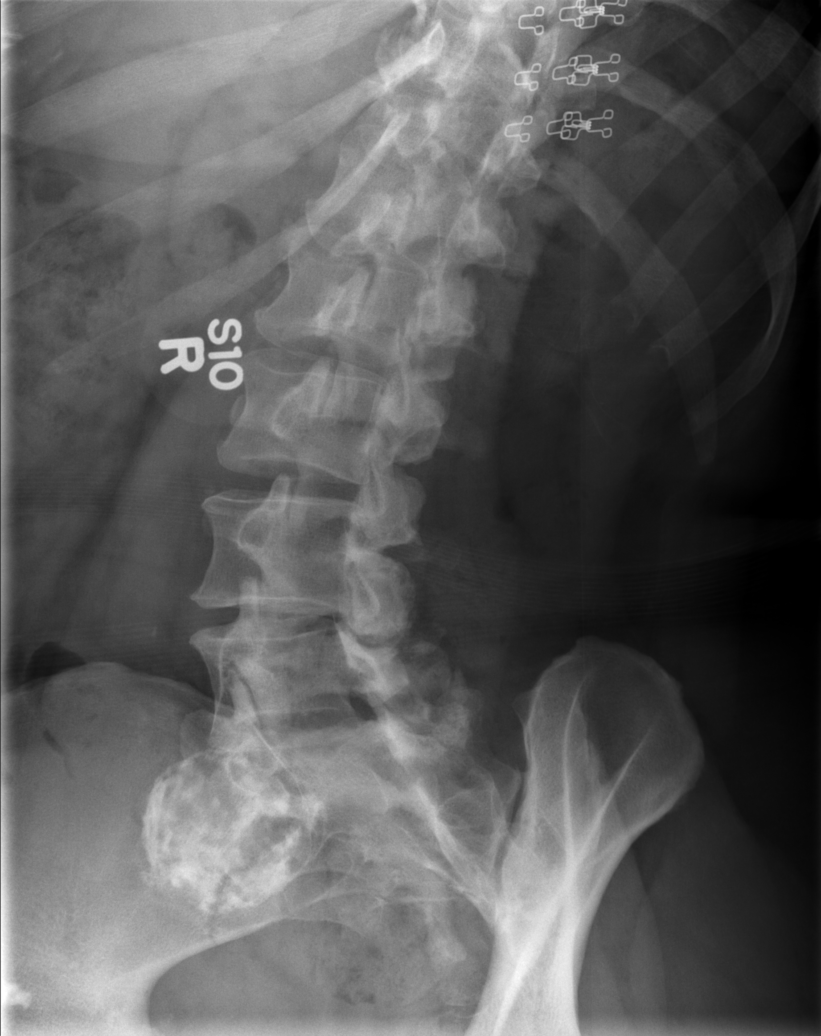

[t lumbar spine obl (2 of 2)]
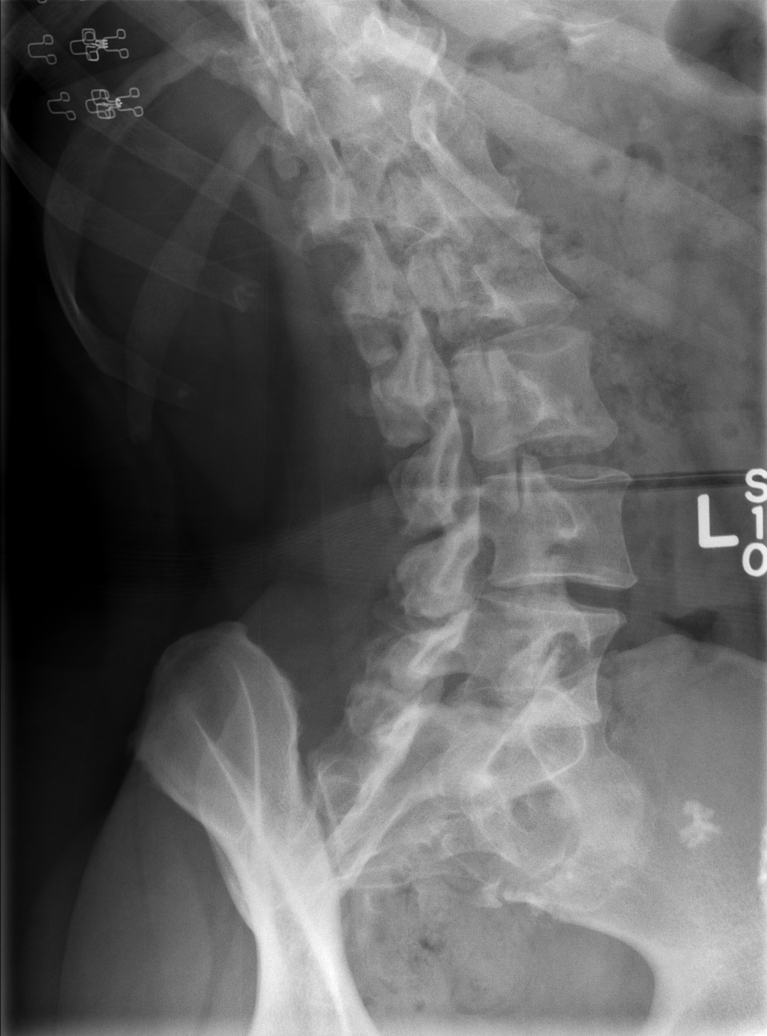

[t lumbar spine lat]
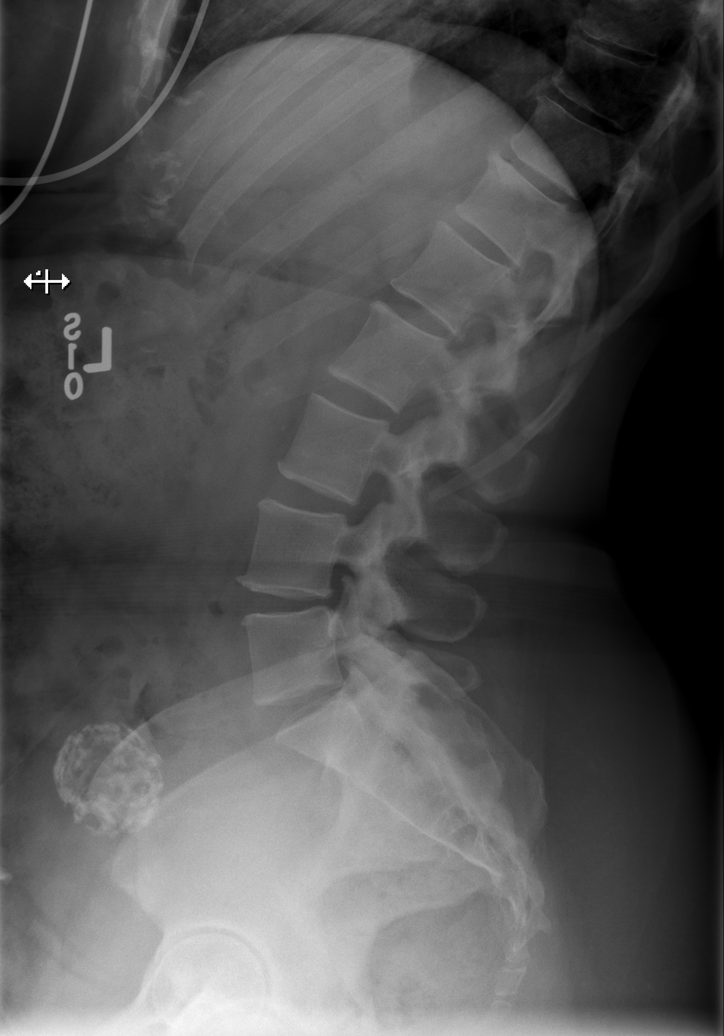

[t lumbar l-5 s-1 spot]
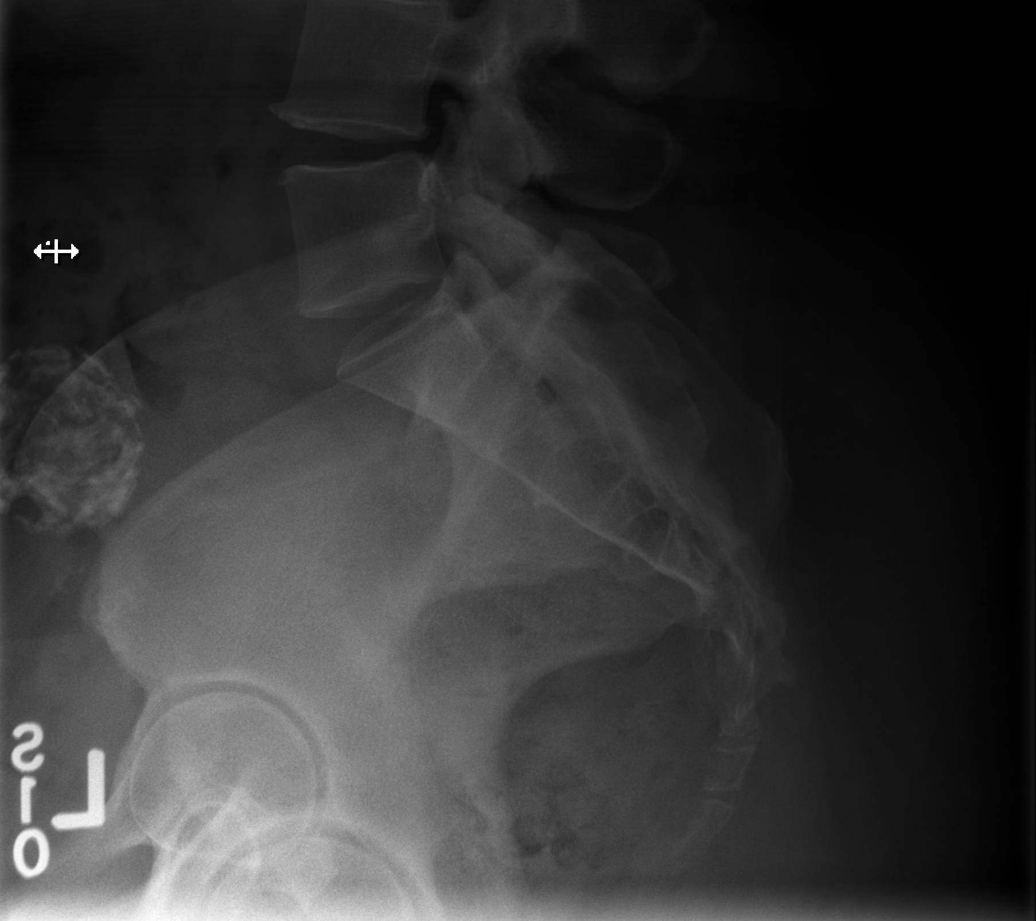

[5 of 5 positions shown; findings below may reference images not displayed]

FINDINGS: Five non-rib-bearing lumbar vertebrae. Mild anterior spur formation
at multiple levels with mild disc space narrowing at the L4-5 level.
Facet degenerative changes in the lower lumbar spine and lumbosacral
junction. No fractures, pars defects or subluxations. Calcified
uterine fibroids are noted.
IMPRESSION: Degenerative changes, as described above. No fracture or
subluxation.

## 2016-02-22 ENCOUNTER — Other Ambulatory Visit: Payer: Self-pay | Admitting: Obstetrics and Gynecology

## 2016-02-29 ENCOUNTER — Encounter (HOSPITAL_COMMUNITY): Payer: Self-pay | Admitting: *Deleted

## 2016-03-10 DIAGNOSIS — N921 Excessive and frequent menstruation with irregular cycle: Secondary | ICD-10-CM | POA: Diagnosis not present

## 2016-03-10 DIAGNOSIS — Z01818 Encounter for other preprocedural examination: Secondary | ICD-10-CM | POA: Diagnosis not present

## 2016-03-28 NOTE — Patient Instructions (Signed)
Your procedure is scheduled on:  Friday, Nov. 17, 2017  [facility] at:  9:30 AM  Pick up the phone at the desk and dial [phone].  Call this number if you have problems the morning of surgery: [phone].  Remember: Do NOT eat food or drink after:  Midnight Thursday, Nov. 16, 2017  Take these medicines the morning of surgery with a SIP OF WATER:  None  Stop ALL herbal medications at this time   Do NOT wear jewelry (body piercing), metal hair clips/bobby pins, make-up, or nail polish. Do NOT wear lotions, powders, or perfumes.  You may wear deodorant. Do NOT shave for 48 hours prior to surgery. Do NOT bring valuables to the hospital. Contacts, dentures, or bridgework may not be worn into surgery.  Have a responsible adult drive you home and stay with you for 24 hours after your procedure

## 2016-03-29 ENCOUNTER — Encounter (HOSPITAL_COMMUNITY)
Admission: RE | Admit: 2016-03-29 | Discharge: 2016-03-29 | Disposition: A | Discharge status: Home / Self Care | Payer: 59 | Source: Ambulatory Visit | Attending: Obstetrics and Gynecology | Admitting: Obstetrics and Gynecology

## 2016-03-29 ENCOUNTER — Encounter (HOSPITAL_COMMUNITY): Payer: Self-pay

## 2016-03-29 DIAGNOSIS — N926 Irregular menstruation, unspecified: Secondary | ICD-10-CM | POA: Insufficient documentation

## 2016-03-29 DIAGNOSIS — Z01812 Encounter for preprocedural laboratory examination: Secondary | ICD-10-CM | POA: Diagnosis not present

## 2016-03-29 HISTORY — DX: Dorsalgia, unspecified: M54.9

## 2016-03-29 HISTORY — DX: Anemia, unspecified: D64.9

## 2016-03-29 LAB — CBC
HCT: 37.8 % (ref 36.0–46.0)
HEMOGLOBIN: 12 g/dL (ref 12.0–15.0)
MCH: 27.3 pg (ref 26.0–34.0)
MCHC: 31.7 g/dL (ref 30.0–36.0)
MCV: 86.1 fL (ref 78.0–100.0)
Platelets: 236 10*3/uL (ref 150–400)
RBC: 4.39 MIL/uL (ref 3.87–5.11)
RDW: 15.2 % (ref 11.5–15.5)
WBC: 8.6 10*3/uL (ref 4.0–10.5)

## 2016-04-08 ENCOUNTER — Encounter (HOSPITAL_COMMUNITY): Payer: Self-pay | Admitting: *Deleted

## 2016-04-08 ENCOUNTER — Ambulatory Visit (HOSPITAL_COMMUNITY): Payer: 59 | Admitting: Anesthesiology

## 2016-04-08 ENCOUNTER — Encounter (HOSPITAL_COMMUNITY)
Admission: RE | Disposition: A | Discharge status: Home / Self Care | Payer: Self-pay | Source: Ambulatory Visit | Attending: Obstetrics and Gynecology

## 2016-04-08 ENCOUNTER — Ambulatory Visit (HOSPITAL_COMMUNITY)
Admission: RE | Admit: 2016-04-08 | Discharge: 2016-04-08 | Disposition: A | Discharge status: Home / Self Care | Payer: 59 | Source: Ambulatory Visit | Attending: Obstetrics and Gynecology | Admitting: Obstetrics and Gynecology

## 2016-04-08 DIAGNOSIS — E669 Obesity, unspecified: Secondary | ICD-10-CM | POA: Diagnosis not present

## 2016-04-08 DIAGNOSIS — D649 Anemia, unspecified: Secondary | ICD-10-CM | POA: Insufficient documentation

## 2016-04-08 DIAGNOSIS — N840 Polyp of corpus uteri: Secondary | ICD-10-CM | POA: Diagnosis present

## 2016-04-08 DIAGNOSIS — Z91013 Allergy to seafood: Secondary | ICD-10-CM | POA: Diagnosis not present

## 2016-04-08 DIAGNOSIS — Z882 Allergy status to sulfonamides status: Secondary | ICD-10-CM | POA: Diagnosis not present

## 2016-04-08 DIAGNOSIS — D509 Iron deficiency anemia, unspecified: Secondary | ICD-10-CM | POA: Diagnosis not present

## 2016-04-08 DIAGNOSIS — Z91041 Radiographic dye allergy status: Secondary | ICD-10-CM | POA: Insufficient documentation

## 2016-04-08 DIAGNOSIS — N921 Excessive and frequent menstruation with irregular cycle: Secondary | ICD-10-CM | POA: Insufficient documentation

## 2016-04-08 DIAGNOSIS — F329 Major depressive disorder, single episode, unspecified: Secondary | ICD-10-CM | POA: Insufficient documentation

## 2016-04-08 DIAGNOSIS — F3281 Premenstrual dysphoric disorder: Secondary | ICD-10-CM | POA: Diagnosis not present

## 2016-04-08 DIAGNOSIS — Z880 Allergy status to penicillin: Secondary | ICD-10-CM | POA: Diagnosis not present

## 2016-04-08 DIAGNOSIS — N920 Excessive and frequent menstruation with regular cycle: Secondary | ICD-10-CM | POA: Diagnosis not present

## 2016-04-08 HISTORY — PX: DILITATION & CURRETTAGE/HYSTROSCOPY WITH HYDROTHERMAL ABLATION: SHX5570

## 2016-04-08 SURGERY — DILATATION & CURETTAGE/HYSTEROSCOPY WITH HYDROTHERMAL ABLATION
Anesthesia: General | Site: Uterus

## 2016-04-08 MED ORDER — PHENYLEPHRINE HCL 10 MG/ML IJ SOLN
INTRAMUSCULAR | Status: DC | PRN
  Administered 2016-04-08 (×3): 40 ug via INTRAVENOUS

## 2016-04-08 MED ORDER — ONDANSETRON HCL 4 MG/2ML IJ SOLN
INTRAMUSCULAR | Status: AC
  Filled 2016-04-08: qty 2

## 2016-04-08 MED ORDER — IBUPROFEN 200 MG PO TABS: 600.0000 mg | ORAL_TABLET | Freq: Four times a day (QID) | ORAL | 0 refills | Status: DC | PRN

## 2016-04-08 MED ORDER — FENTANYL CITRATE (PF) 100 MCG/2ML IJ SOLN: 25.0000 ug | INTRAMUSCULAR | Status: DC | PRN

## 2016-04-08 MED ORDER — MEPERIDINE HCL 25 MG/ML IJ SOLN: 6.2500 mg | INTRAMUSCULAR | Status: DC | PRN

## 2016-04-08 MED ORDER — SCOPOLAMINE 1 MG/3DAYS TD PT72
MEDICATED_PATCH | TRANSDERMAL | Status: AC
  Filled 2016-04-08: qty 1

## 2016-04-08 MED ORDER — DEXAMETHASONE SODIUM PHOSPHATE 10 MG/ML IJ SOLN
INTRAMUSCULAR | Status: AC
  Filled 2016-04-08: qty 1

## 2016-04-08 MED ORDER — PROPOFOL 10 MG/ML IV BOLUS
INTRAVENOUS | Status: DC | PRN
  Administered 2016-04-08: 200 mg via INTRAVENOUS

## 2016-04-08 MED ORDER — FENTANYL CITRATE (PF) 100 MCG/2ML IJ SOLN
INTRAMUSCULAR | Status: DC | PRN
  Administered 2016-04-08 (×2): 50 ug via INTRAVENOUS
  Administered 2016-04-08: 25 ug via INTRAVENOUS

## 2016-04-08 MED ORDER — SCOPOLAMINE 1 MG/3DAYS TD PT72
1.0000 | MEDICATED_PATCH | Freq: Once | TRANSDERMAL | Status: DC
  Administered 2016-04-08: 1.5 mg via TRANSDERMAL

## 2016-04-08 MED ORDER — HYDROCODONE-ACETAMINOPHEN 7.5-325 MG PO TABS
ORAL_TABLET | ORAL | Status: AC
  Filled 2016-04-08: qty 1

## 2016-04-08 MED ORDER — HYDROCODONE-ACETAMINOPHEN 7.5-325 MG PO TABS
1.0000 | ORAL_TABLET | Freq: Once | ORAL | Status: AC | PRN
  Administered 2016-04-08: 1 via ORAL

## 2016-04-08 MED ORDER — METOCLOPRAMIDE HCL 5 MG/ML IJ SOLN: 10.0000 mg | Freq: Once | INTRAMUSCULAR | Status: DC | PRN

## 2016-04-08 MED ORDER — HYDROCODONE-ACETAMINOPHEN 5-325 MG PO TABS: 1.0000 | ORAL_TABLET | Freq: Four times a day (QID) | ORAL | 0 refills | Status: DC | PRN

## 2016-04-08 MED ORDER — MIDAZOLAM HCL 2 MG/2ML IJ SOLN
INTRAMUSCULAR | Status: AC
  Filled 2016-04-08: qty 2

## 2016-04-08 MED ORDER — FENTANYL CITRATE (PF) 100 MCG/2ML IJ SOLN
INTRAMUSCULAR | Status: AC
  Filled 2016-04-08: qty 2

## 2016-04-08 MED ORDER — ONDANSETRON HCL 4 MG/2ML IJ SOLN
INTRAMUSCULAR | Status: DC | PRN
  Administered 2016-04-08: 4 mg via INTRAVENOUS

## 2016-04-08 MED ORDER — PROPOFOL 10 MG/ML IV BOLUS
INTRAVENOUS | Status: AC
  Filled 2016-04-08: qty 20

## 2016-04-08 MED ORDER — SODIUM CHLORIDE 0.9 % IR SOLN
Status: DC | PRN
  Administered 2016-04-08: 3000 mL

## 2016-04-08 MED ORDER — MIDAZOLAM HCL 2 MG/2ML IJ SOLN
INTRAMUSCULAR | Status: DC | PRN
  Administered 2016-04-08: 2 mg via INTRAVENOUS

## 2016-04-08 MED ORDER — GLYCOPYRROLATE 0.2 MG/ML IJ SOLN
INTRAMUSCULAR | Status: DC | PRN
  Administered 2016-04-08: 0.1 mg via INTRAVENOUS

## 2016-04-08 MED ORDER — LIDOCAINE HCL 1 % IJ SOLN
INTRAMUSCULAR | Status: AC
  Filled 2016-04-08: qty 20

## 2016-04-08 MED ORDER — DEXAMETHASONE SODIUM PHOSPHATE 10 MG/ML IJ SOLN
INTRAMUSCULAR | Status: DC | PRN
  Administered 2016-04-08: 5 mg via INTRAVENOUS

## 2016-04-08 MED ORDER — KETOROLAC TROMETHAMINE 30 MG/ML IJ SOLN
INTRAMUSCULAR | Status: DC | PRN
  Administered 2016-04-08: 30 mg via INTRAVENOUS

## 2016-04-08 MED ORDER — KETOROLAC TROMETHAMINE 30 MG/ML IJ SOLN
INTRAMUSCULAR | Status: AC
  Filled 2016-04-08: qty 1

## 2016-04-08 MED ORDER — LIDOCAINE HCL 2 % IJ SOLN
INTRAMUSCULAR | Status: DC | PRN
  Administered 2016-04-08: 10 mL

## 2016-04-08 MED ORDER — GLYCOPYRROLATE 0.2 MG/ML IJ SOLN
INTRAMUSCULAR | Status: AC
  Filled 2016-04-08: qty 1

## 2016-04-08 MED ORDER — DOXYCYCLINE HYCLATE 50 MG PO CAPS: ORAL_CAPSULE | ORAL | 0 refills | Status: DC

## 2016-04-08 MED ORDER — LACTATED RINGERS IV SOLN
INTRAVENOUS | Status: DC
  Administered 2016-04-08: 10:00:00 via INTRAVENOUS

## 2016-04-08 MED ORDER — LIDOCAINE HCL (CARDIAC) 20 MG/ML IV SOLN
INTRAVENOUS | Status: AC
  Filled 2016-04-08: qty 5

## 2016-04-08 MED ORDER — PHENYLEPHRINE 40 MCG/ML (10ML) SYRINGE FOR IV PUSH (FOR BLOOD PRESSURE SUPPORT)
PREFILLED_SYRINGE | INTRAVENOUS | Status: AC
  Filled 2016-04-08: qty 10

## 2016-04-08 MED ORDER — HYDROCODONE-ACETAMINOPHEN 7.5-325 MG PO TABS: 1.0000 | ORAL_TABLET | Freq: Once | ORAL | 0 refills | Status: DC | PRN

## 2016-04-08 MED ORDER — LIDOCAINE HCL (CARDIAC) 20 MG/ML IV SOLN
INTRAVENOUS | Status: DC | PRN
  Administered 2016-04-08: 100 mg via INTRAVENOUS

## 2016-04-08 MED FILL — DOXYCYCLINE HYC 50 MG CAP: 50 | 7 days supply | Qty: 28 | Fill #0

## 2016-04-08 MED FILL — HYDROCODON-APAP 5-325: 5-325 | 5 days supply | Qty: 20 | Fill #0

## 2016-04-08 SURGICAL SUPPLY — 18 items
CANISTER SUCT 3000ML (MISCELLANEOUS) ×3 IMPLANT
CATH ROBINSON RED A/P 16FR (CATHETERS) ×3 IMPLANT
CLOTH BEACON ORANGE TIMEOUT ST (SAFETY) ×3 IMPLANT
CONTAINER PREFILL 10% NBF 60ML (FORM) ×6 IMPLANT
DILATOR CANAL MILEX (MISCELLANEOUS) ×2 IMPLANT
GLOVE BIO SURGEON STRL SZ 6.5 (GLOVE) ×2 IMPLANT
GLOVE BIO SURGEONS STRL SZ 6.5 (GLOVE) ×1
GLOVE BIOGEL PI IND STRL 7.0 (GLOVE) ×2 IMPLANT
GLOVE BIOGEL PI INDICATOR 7.0 (GLOVE) ×4
GOWN STRL REUS W/TWL LRG LVL3 (GOWN DISPOSABLE) ×6 IMPLANT
PACK VAGINAL MINOR WOMEN LF (CUSTOM PROCEDURE TRAY) ×3 IMPLANT
PAD OB MATERNITY 4.3X12.25 (PERSONAL CARE ITEMS) ×3 IMPLANT
SEAL ROD LENS SCOPE MYOSURE (ABLATOR) ×2 IMPLANT
SET GENESYS HTA PROCERVA (MISCELLANEOUS) ×3 IMPLANT
TOWEL OR 17X24 6PK STRL BLUE (TOWEL DISPOSABLE) ×6 IMPLANT
TUBING AQUILEX INFLOW (TUBING) ×2 IMPLANT
TUBING AQUILEX OUTFLOW (TUBING) ×2 IMPLANT
WATER STERILE IRR 1000ML POUR (IV SOLUTION) ×3 IMPLANT

## 2016-04-08 NOTE — H&P (Signed)
Victoria Garcia y.o. female. Who presents with heavy and irreg vaginal bleeding for years. .  .  She has uses 1-2 pads/ tampons every hour while menstruating.  She denies any CP or SOB.  Motrin occmakes it better.  Nothing makes it worse.  No dysmenorrhea.  Pt has tried motrin and pain meds without success. Pertinent Gynecological History: Contraception: Education given regarding options for contraception, including BTL. Blood transfusions: none Sexually transmitted diseases: naq Previous GYN Procedures: BTL Last mammogram:  Last pap: normal Date: 05/2015 WNL OB History: G2P2   Menstrual History: Menarche age: [age] Patient's last menstrual period was 02/22/2016 (exact date).    Past Medical History:  Diagnosis Date  . Allergy    SEASONAL  . Anemia   . Back pain    Past Surgical History:  Procedure Laterality Date  . BREAST REDUCTION SURGERY  2004   bilat  . BREAST SURGERY  2007   BREAST REDUCTION  . CESAREAN SECTION  2002  . COLONOSCOPY    . TONSILLECTOMY    . TUBAL LIGATION    . VAGINAL DELIVERY  1999    Current Facility-Administered Medications:  .  lactated ringers infusion, , Intravenous, Continuous, Victoria SilvasKevin D Hollis, MD, Last Rate: 125 mL/hr at 04/08/16 0933 .  scopolamine (TRANSDERM-SCOP) 1 MG/3DAYS 1.5 mg, 1 patch, Transdermal, Once, Victoria SilvasKevin D Hollis, MD, 1.5 mg at 04/08/16 0904 .  scopolamine (TRANSDERM-SCOP) 1 MG/3DAYS, , , ,  .  sodium chloride irrigation 0.9 %, , , PRN, Liliah Dorian, MD, 3,000 mL at 04/08/16 1039 Allergies  Allergen Reactions  . Contrast Media [Iodinated Diagnostic Agents] Anaphylaxis  . Shellfish Allergy Anaphylaxis  . Penicillins Rash and Other (See Comments)    Has patient had Garcia PCN reaction causing immediate rash, facial/tongue/throat swelling, SOB or lightheadedness with hypotension: No Has patient had Garcia PCN reaction causing severe rash involving mucus membranes or skin necrosis: No Has patient had Garcia PCN reaction that required  hospitalization No Has patient had Garcia PCN reaction occurring within the last 10 years: No If all of the above answers are "NO", then may proceed with Cephalosporin use.  . Sulfa Antibiotics Rash   Review of Systems - Negative except GU   Physical Exam  BP 127/83   Pulse 72   Temp 98.2 F (36.8 C) (Oral)   Resp 20   LMP 02/22/2016 (Exact Date)   SpO2 100%  Constitutional: She appears well-developed and well-nourished.  HENT:  Head: Normocephalic.  Eyes: Pupils are equal, round, and reactive to light.  Neck: Normal range of motion. Neck supple.  Cardiovascular: Regular rhythm.   Respiratory: Effort normal and breath sounds normal.  GI: Soft.  Genitourinary: wnl. Uterus enlarged and irregular and NT Musculoskeletal: Normal range of motion.  Neurological: She is alert.  Skin: Skin is warm.  Psychiatric: She has Garcia normal mood and affect.  No results found for this or any previous visit (from the past 72 hour(s)). US width3  Length11 cm Ovariesnormal  Assessment/Plan: Menorrhagia with endometrial polyp and fibroids Pt desires ablation and poypectomy Pt offered  obs vs surgery.  Pt chose surgery.  Plan D&C hysteroscopy polypectomy.  Risks are but not limited to bleeding, infection, scarring of the uterus and perforation.     Victoria Garcia 04/11/2011, 11:41 AM

## 2016-04-08 NOTE — Op Note (Signed)
Preop Diagnosis: Excessive and frequent menstruation with irregular cycles and polyp  Postop Diagnosis: EXCESSIVE AND FREQUENT MENSTRUATION   Procedure: Hysteroscopy and hydrothermal ablation   Anesthesia: General   Anesthesiologist: Shelton SilvasKevin D Hollis, MD   Attending: Jaymes GraffNaima Isrrael Fluckiger, MD   Assistant: none   Findings: small cavity noted with long cervix and uterine body.  No masses noted  Pathology: none.  Pt had normal EMBX  Fluids: 1500 cc  UOP: pt used the bathroom just before the procedure.  No cath was done  EBL: minimal  Complications: none  Procedure: The patient was taken to the operating room after risks benefits and alternatives were discussed with patient, the patient verbalized understanding and consent signed and witnessed. The patient was placed under general anesthesia and prepped and draped in normal sterile fashion. A bivalve speculum was placed in the patient's vagina and the anterior lip of the cervix was grasped with a single-tooth tenaculum.  Twenty cc 1% lidocaine used for a cervical block.   The cervix was dilated for passage of the hysteroscope. The uterus sounded to 11.  The hysteroscope was introduced into the uterine cavity with findings as noted above. The hysteroscope was reintroduced and hydrothermal ablation was performed without difficulty. The tenaculum and bivalve speculum were removed and there was good hemostasis at the tenaculum sites. Sponge lap and needle count was correct. The patient tolerated procedure well and was returned to the recovery room in good condition.

## 2016-04-08 NOTE — Discharge Instructions (Signed)
Hysteroscopy Hysteroscopy is a procedure used for looking inside the womb (uterus). It may be done for various reasons, including: To evaluate abnormal bleeding, fibroid (benign, noncancerous) tumors, polyps, scar tissue (adhesions), and possibly cancer of the uterus. To look for lumps (tumors) and other uterine growths. To look for causes of why a woman cannot get pregnant (infertility), causes of recurrent loss of pregnancy (miscarriages), or a lost intrauterine device (IUD). To perform a sterilization by blocking the fallopian tubes from inside the uterus. In this procedure, a thin, flexible tube with a tiny light and camera on the end of it (hysteroscope) is used to look inside the uterus. A hysteroscopy should be done right after a menstrual period to be sure you are not pregnant. LET [facility] CARE PROVIDER KNOW ABOUT:  Any allergies you have. All medicines you are taking, including vitamins, herbs, eye drops, creams, and over-the-counter medicines. Previous problems you or members of your family have had with the use of anesthetics. Any blood disorders you have. Previous surgeries you have had. Medical conditions you have. RISKS AND COMPLICATIONS  Generally, this is a safe procedure. However, as with any procedure, complications can occur. Possible complications include: Putting a hole in the uterus. Excessive bleeding. Infection. Damage to the cervix. Injury to other organs. Allergic reaction to medicines. Too much fluid used in the uterus for the procedure. BEFORE THE PROCEDURE  Ask your health care provider about changing or stopping any regular medicines. Do not take aspirin or blood thinners for 1 week before the procedure, or as directed by your health care provider. These can cause bleeding. If you smoke, do not smoke for 2 weeks before the procedure. In some cases, a medicine is placed in the cervix the day before the procedure. This medicine makes the cervix have a larger  opening (dilate). This makes it easier for the instrument to be inserted into the uterus during the procedure. Do not eat or drink anything for at least 8 hours before the surgery. Arrange for someone to take you home after the procedure. PROCEDURE  You may be given a medicine to relax you (sedative). You may also be given one of the following: A medicine that numbs the area around the cervix (local anesthetic). A medicine that makes you sleep through the procedure (general anesthetic). The hysteroscope is inserted through the vagina into the uterus. The camera on the hysteroscope sends a picture to a TV screen. This gives the surgeon a good view inside the uterus. During the procedure, air or a liquid is put into the uterus, which allows the surgeon to see better. Sometimes, tissue is gently scraped from inside the uterus. These tissue samples are sent to a lab for testing. AFTER THE PROCEDURE  If you had a general anesthetic, you may be groggy for a couple hours after the procedure. If you had a local anesthetic, you will be able to go home as soon as you are stable and feel ready. You may have some cramping. This normally lasts for a couple days. You may have bleeding, which varies from light spotting for a few days to menstrual-like bleeding for 3-7 days. This is normal. If your test results are not back during the visit, make an appointment with your health care provider to find out the results. This information is not intended to replace advice given to you by your health care provider. Make sure you discuss any questions you have with your health care provider. Document Released: 08/15/2000 Document  Revised: 02/27/2013 Document Reviewed: 12/06/2012 Elsevier Interactive Patient Education  2017 Elsevier Inc[employer] Endometrial Ablation Endometrial ablation removes the lining of the uterus (endometrium). It is usually a same-day, outpatient treatment. Ablation helps avoid major surgery, such as  surgery to remove the cervix and uterus (hysterectomy). After endometrial ablation, you will have little or no menstrual bleeding and may not be able to have children. However, if you are premenopausal, you will need to use a reliable method of birth control following the procedure because of the small chance that pregnancy can occur. There are different reasons to have this procedure. These reasons include:  Heavy periods.  Bleeding that is causing anemia.  Irregular bleeding.  Bleeding fibroids on the lining inside the uterus if they are smaller than 3 centimeters. This procedure may not be possible for you if:   You want to have children in the future.   You have severe cramps with your menstrual period.   You have precancerous or cancerous cells in your uterus.   You were recently pregnant.   You have gone through menopause.   You have had major surgery on your uterus, resulting in thinning of the uterine wall. Surgeries may include:  The removal of one or more uterine fibroids (myomectomy).  A cesarean section with a classic (vertical) incision on your uterus. Ask your health care provider what type of cesarean you had. Sometimes the scar on your skin is different than the scar on your uterus. Even if you have had surgery on your uterus, certain types of ablation may still be safe for you. Talk with your health care provider. [facility] KNOW ABOUT:  Any allergies you have.  All medicines you are taking, including vitamins, herbs, eye drops, creams, and over-the-counter medicines.  Previous problems you or members of your family have had with the use of anesthetics.  Any blood disorders you have.  Previous surgeries you have had.  Medical conditions you have. RISKS AND COMPLICATIONS  Generally, this is a safe procedure. However, as with any procedure, complications can occur. Possible complications include:  Perforation of the  uterus.  Bleeding.  Infection of the uterus, bladder, or vagina.  Injury to surrounding organs.  An air bubble to the lung (air embolus).  Pregnancy following the procedure.  Failure of the procedure to help the problem, requiring hysterectomy.  Decreased ability to diagnose cancer in the lining of the uterus. BEFORE THE PROCEDURE  The lining of the uterus must be tested to make sure there is no pre-cancerous or cancer cells present.  An ultrasound may be performed to look at the size of the uterus and to check for abnormalities.  Medicines may be given to thin the lining of the uterus. PROCEDURE  During the procedure, your health care provider will use a tool called a resectoscope to help see inside your uterus. There are different ways to remove the lining of your uterus.   Radiofrequency - This method uses a radiofrequency-alternating electric current to remove the lining of the uterus.  Cryotherapy - This method uses extreme cold to freeze the lining of the uterus.  Heated-Free Liquid - This method uses heated salt (saline) solution to remove the lining of the uterus.  Microwave - This method uses high-energy microwaves to heat up the lining of the uterus to remove it.  Thermal balloon - This method involves inserting a catheter with a balloon tip into the uterus. The balloon tip is filled with heated fluid to  remove the lining of the uterus. AFTER THE PROCEDURE  After your procedure, do not have sexual intercourse or insert anything into your vagina until permitted by your health care provider. After the procedure, you may experience:  Cramps.  Vaginal discharge.  Frequent urination. This information is not intended to replace advice given to you by your health care provider. Make sure you discuss any questions you have with your health care provider. Document Released: 03/18/2004 Document Revised: 01/28/2015 Document Reviewed: 10/10/2012 Elsevier Interactive Patient  Education  2017 Elsevier Inc.   DISCHARGE INSTRUCTIONS: HYSTEROSCOPY / ENDOMETRIAL ABLATION The following instructions have been prepared to help you care for yourself upon your return home.  May Remove Scop patch on or before 04/11/16  May take Ibuprofen after 5:15pm today.  May take stool softner while taking narcotic pain medication to prevent constipation.  Drink plenty of water.  Personal hygiene:  Use sanitary pads for vaginal drainage, not tampons.  Shower the day after your procedure.  NO tub baths, pools or Jacuzzis for 2-3 weeks.  Wipe front to back after using the bathroom.  Activity and limitations:  Do NOT drive or operate any equipment for 24 hours. The effects of anesthesia are still present and drowsiness may result.  Do NOT rest in bed all day.  Walking is encouraged.  Walk up and down stairs slowly.  You may resume your normal activity in one to two days or as indicated by your physician. Sexual activity: NO intercourse for at least 2 weeks after the procedure, or as indicated by your Doctor.  Diet: Eat a light meal as desired this evening. You may resume your usual diet tomorrow.  Return to Work: You may resume your work activities in one to two days or as indicated by Therapist, sportsyour Doctor.  What to expect after your surgery: Expect to have vaginal bleeding/discharge for 2-3 days and spotting for up to 10 days. It is not unusual to have soreness for up to 1-2 weeks. You may have a slight burning sensation when you urinate for the first day. Mild cramps may continue for a couple of days. You may have a regular period in 2-6 weeks.  Call your doctor for any of the following:  Excessive vaginal bleeding or clotting, saturating and changing one pad every hour.  Inability to urinate 6 hours after discharge from hospital.  Pain not relieved by pain medication.  Fever of 100.4 F or greater.  Unusual vaginal discharge or odor.  Return to office  _________________Call for an appointment ___________________ Patients signature: ______________________ Nurses signature ________________________  Post Anesthesia Care Unit [phone]     Post Anesthesia Home Care Instructions  Activity: Get plenty of rest for the remainder of the day. A responsible adult should stay with you for 24 hours following the procedure.  For the next 24 hours, DO NOT: -Drive a car -Advertising copywriterperate machinery -Drink alcoholic beverages -Take any medication unless instructed by your physician -Make any legal decisions or sign important papers.  Meals: Start with liquid foods such as gelatin or soup. Progress to regular foods as tolerated. Avoid greasy, spicy, heavy foods. If nausea and/or vomiting occur, drink only clear liquids until the nausea and/or vomiting subsides. Call your physician if vomiting continues.  Special Instructions/Symptoms: Your throat may feel dry or sore from the anesthesia or the breathing tube placed in your throat during surgery. If this causes discomfort, gargle with warm salt water. The discomfort should disappear within 24 hours.  If you had a  scopolamine patch placed behind your ear for the management of post- operative nausea and/or vomiting:  1. The medication in the patch is effective for 72 hours, after which it should be removed.  Wrap patch in a tissue and discard in the trash. Wash hands thoroughly with soap and water. 2. You may remove the patch earlier than 72 hours if you experience unpleasant side effects which may include dry mouth, dizziness or visual disturbances. 3. Avoid touching the patch. Wash your hands with soap and water after contact with the patch.

## 2016-04-08 NOTE — Anesthesia Postprocedure Evaluation (Signed)
Anesthesia Post Note  Patient: Victoria Garcia  Procedure(s) Performed: Procedure(s) (LRB): HYSTEROSCOPY , HYDROTHERMAL ABLATION (N/A)  Patient location during evaluation: PACU Anesthesia Type: General Level of consciousness: awake and alert and oriented Pain management: pain level controlled Vital Signs Assessment: post-procedure vital signs reviewed and stable Respiratory status: spontaneous breathing, respiratory function stable and nonlabored ventilation Cardiovascular status: blood pressure returned to baseline and stable Postop Assessment: no signs of nausea or vomiting Anesthetic complications: no     Last Vitals:  Vitals:   04/08/16 1215 04/08/16 1230  BP: 121/65 128/70  Pulse: 74 63  Resp: 18 18  Temp:      Last Pain:  Vitals:   04/08/16 1230  TempSrc:   PainSc: 1    Pain Goal: Patients Stated Pain Goal: 5 (04/08/16 0911)               Joshua Soulier A.

## 2016-04-08 NOTE — Anesthesia Preprocedure Evaluation (Addendum)
Anesthesia Evaluation   Patient awake    Reviewed: Allergy & Precautions, NPO status , Patient's Chart, lab work & pertinent test results  Airway Mallampati: II  TM Distance: >3 FB Neck ROM: Full    Dental no notable dental hx. (+) Teeth Intact   Pulmonary neg pulmonary ROS,    Pulmonary exam normal breath sounds clear to auscultation       Cardiovascular negative cardio ROS Normal cardiovascular exam Rhythm:Regular Rate:Normal     Neuro/Psych PSYCHIATRIC DISORDERS Depression negative neurological ROS     GI/Hepatic negative GI ROS, Neg liver ROS,   Endo/Other  Obesity  Renal/GU negative Renal ROS  negative genitourinary   Musculoskeletal negative musculoskeletal ROS (+)   Abdominal (+) + obese,   Peds negative pediatric ROS (+)  Hematology  (+) anemia ,   Anesthesia Other Findings   Reproductive/Obstetrics menomerorrhagia                            Anesthesia Physical Anesthesia Plan  ASA: II  Anesthesia Plan: General   Post-op Pain Management:    Induction: Intravenous  Airway Management Planned: LMA  Additional Equipment:   Intra-op Plan:   Post-operative Plan: Extubation in OR  Informed Consent: I have reviewed the patients History and Physical, chart, labs and discussed the procedure including the risks, benefits and alternatives for the proposed anesthesia with the patient or authorized representative who has indicated his/her understanding and acceptance.   Dental advisory given  Plan Discussed with: CRNA  Anesthesia Plan Comments:         Anesthesia Quick Evaluation

## 2016-04-08 NOTE — Anesthesia Procedure Notes (Signed)
Procedure Name: LMA Insertion Date/Time: 04/08/2016 10:59 AM Performed by: Earmon PhoenixWILKERSON, Kyara Boxer P Pre-anesthesia Checklist: Patient identified, Emergency Drugs available, Suction available, Patient being monitored and Timeout performed Patient Re-evaluated:Patient Re-evaluated prior to inductionOxygen Delivery Method: Circle system utilized Preoxygenation: Pre-oxygenation with 100% oxygen Intubation Type: IV induction Ventilation: Mask ventilation without difficulty LMA: LMA inserted LMA Size: 4.0 Number of attempts: 1 Tube secured with: Tape Dental Injury: Teeth and Oropharynx as per pre-operative assessment

## 2016-04-08 NOTE — Transfer of Care (Signed)
Immediate Anesthesia Transfer of Care Note  Patient: Victoria Garcia  Procedure(s) Performed: Procedure(s): HYSTEROSCOPY , HYDROTHERMAL ABLATION (N/A)  Patient Location: PACU  Anesthesia Type:General  Level of Consciousness: awake, alert , oriented and patient cooperative  Airway & Oxygen Therapy: Patient Spontanous Breathing and Patient connected to nasal cannula oxygen  Post-op Assessment: Report given to RN and Post -op Vital signs reviewed and stable  Post vital signs: Reviewed and stable  Last Vitals:  Vitals:   04/08/16 0911  BP: 127/83  Pulse: 72  Resp: 20  Temp: 36.8 C    Last Pain:  Vitals:   04/08/16 0911  TempSrc: Oral      Patients Stated Pain Goal: 5 (04/08/16 0911)  Complications: No apparent anesthesia complications

## 2016-04-10 ENCOUNTER — Encounter (HOSPITAL_COMMUNITY): Payer: Self-pay | Admitting: Obstetrics and Gynecology

## 2016-04-25 DIAGNOSIS — Z09 Encounter for follow-up examination after completed treatment for conditions other than malignant neoplasm: Secondary | ICD-10-CM | POA: Diagnosis not present

## 2016-04-25 DIAGNOSIS — R300 Dysuria: Secondary | ICD-10-CM | POA: Diagnosis not present

## 2016-04-25 MED FILL — traMADol HCL 50 MG TABS: 50 | 8 days supply | Qty: 30 | Fill #0

## 2016-06-20 ENCOUNTER — Encounter: Payer: Self-pay | Admitting: Family Medicine

## 2016-06-20 ENCOUNTER — Other Ambulatory Visit: Payer: 59

## 2016-06-20 ENCOUNTER — Ambulatory Visit (INDEPENDENT_AMBULATORY_CARE_PROVIDER_SITE_OTHER): Payer: 59 | Admitting: Family Medicine

## 2016-06-20 VITALS — BP 132/68 | HR 88 | Temp 98.8°F | Resp 14 | Ht 64.0 in | Wt 207.0 lb

## 2016-06-20 DIAGNOSIS — R7302 Impaired glucose tolerance (oral): Secondary | ICD-10-CM

## 2016-06-20 DIAGNOSIS — M25562 Pain in left knee: Secondary | ICD-10-CM

## 2016-06-20 DIAGNOSIS — M25561 Pain in right knee: Secondary | ICD-10-CM

## 2016-06-20 DIAGNOSIS — M5136 Other intervertebral disc degeneration, lumbar region: Secondary | ICD-10-CM

## 2016-06-20 DIAGNOSIS — Z01419 Encounter for gynecological examination (general) (routine) without abnormal findings: Secondary | ICD-10-CM | POA: Diagnosis not present

## 2016-06-20 DIAGNOSIS — E6609 Other obesity due to excess calories: Secondary | ICD-10-CM | POA: Diagnosis not present

## 2016-06-20 DIAGNOSIS — G8929 Other chronic pain: Secondary | ICD-10-CM

## 2016-06-20 DIAGNOSIS — E559 Vitamin D deficiency, unspecified: Secondary | ICD-10-CM | POA: Diagnosis not present

## 2016-06-20 DIAGNOSIS — Z6835 Body mass index (BMI) 35.0-35.9, adult: Secondary | ICD-10-CM

## 2016-06-20 DIAGNOSIS — N951 Menopausal and female climacteric states: Secondary | ICD-10-CM | POA: Diagnosis not present

## 2016-06-20 DIAGNOSIS — Z0000 Encounter for general adult medical examination without abnormal findings: Secondary | ICD-10-CM

## 2016-06-20 MED ORDER — PAROXETINE HCL 10 MG PO TABS: 10.0000 mg | ORAL_TABLET | Freq: Every day | ORAL | 6 refills | Status: DC

## 2016-06-20 MED FILL — PARoxetine HCL 10 MG TABS: 10 | 30 days supply | Qty: 30 | Fill #0

## 2016-06-20 NOTE — Assessment & Plan Note (Signed)
Check RF, no swelling good ROM today Discussed weight loss and how this will benefit her

## 2016-06-20 NOTE — Patient Instructions (Addendum)
Release of records- Dr. Loreta AveMann GI for colonoscopy Release of records- Dr. Normand Sloopillard GYN- last PAP /mammogram Start paxil once a day  F/U 2 MONTHS for recheck

## 2016-06-20 NOTE — Assessment & Plan Note (Signed)
Check Vitamin D level 

## 2016-06-20 NOTE — Assessment & Plan Note (Signed)
Continue with pilates/yoga Discussed going to orthopedics for possible injections if her pain progresses we'll hold off at this time

## 2016-06-20 NOTE — Progress Notes (Signed)
Subjective:    [name], female    DOB: [birth year], 56 y.o.   MRN: [id number]  Patient presents for CPE (is not fasting)    Pt here for CPE ,medications, history reviewed  PAP Smear- Dr. Normand Sloop  In Jan her GYN , had ablation done DUB has improved no cycle since Nov ,does have some hot flashes and mood swings since procedure , she feels like she is on edge and anxious all the time, in the past was on Sarafem and paxil would like to try something again. She is also not sleeping as well    Colonoscopy- UTD Dr. 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 She also is getting joint pain in her knees but has not had any swelling locking or giving out. She does have some family history of rheumatoid arthritis we'll like to have this checked.       Review Of Systems:  GEN- + fatigue, fever, weight loss,weakness, recent illness HEENT- denies eye drainage, change in vision, nasal discharge, CVS- denies chest pain, palpitations RESP- denies SOB, cough, wheeze ABD- denies N/V, change in stools, abd pain GU- denies dysuria, hematuria, dribbling, incontinence MSK- + joint pain, muscle aches, injury Neuro- denies headache, dizziness, syncope, seizure activity       Objective:    BP 132/68 (BP Location: Left Arm, Patient Position: Sitting, Cuff Size: Large)   Pulse 88   Temp 98.8 F (37.1 C) (Oral)   Resp 14   Ht 5\' 4"  (1.626 m)   Wt 207 lb (93.9 kg)   SpO2 98%   BMI 35.53 kg/m  GEN- NAD, alert and oriented x3 HEENT- PERRL, EOMI, non injected sclera, pink conjunctiva, MMM, oropharynx clear Neck- Supple, no thyromegaly CVS- RRR, no murmur RESP-CTAB ABD-NABS,soft,NT,ND MSK-  bilat knees, no effusion, good ROM, mild crepitus, ligaments in tact  Psych- normal affect and mood  EXT- No edema Pulses- Radial, DP- 2+        Assessment & Plan:      Problem List Items Addressed This Visit    Vitamin D deficiency    Check Vitamin D level      Relevant Orders   Vitamin D, 25-hydroxy   Obesity   Menopausal symptoms    Trial of paxil 10mg  at bedtime       Knee pain, bilateral    Check RF, no swelling good ROM today Discussed weight loss and how this will benefit her       Relevant Orders   Rheumatoid factor   Glucose intolerance (impaired glucose tolerance)   Relevant Orders   Hemoglobin A1c   DDD (degenerative disc disease), lumbar    Continue with pilates/yoga Discussed going to orthopedics for possible injections if her pain progresses we'll hold off at this time       Other Visit Diagnoses    Routine general medical examination at a health care facility    -  Primary   CPE done, obtain reports from PAP and colonoscopy, return for fasting labs. Immunizations UTD    Relevant Orders   CBC with Differential/Platelet   Comprehensive metabolic  panel   Lipid panel   TSH   Vitamin B12      Note: This dictation was prepared with Dragon dictation along with smaller phrase technology. Any transcriptional errors that result from this process are unintentional.

## 2016-06-20 NOTE — Assessment & Plan Note (Signed)
Trial of paxil 10mg  at bedtime

## 2016-06-21 LAB — LIPID PANEL
Cholesterol: 226 mg/dL — ABNORMAL HIGH (ref <200)
HDL: 39 mg/dL — ABNORMAL LOW (ref >50)
LDL CALC: 159 mg/dL — AB (ref <100)
TRIGLYCERIDES: 139 mg/dL (ref <150)
Total CHOL/HDL Ratio: 5.8 Ratio — ABNORMAL HIGH (ref <5.0)
VLDL: 28 mg/dL (ref <30)

## 2016-06-21 LAB — CBC WITH DIFFERENTIAL/PLATELET
BASOS ABS: 0 {cells}/uL (ref 0–200)
BASOS PCT: 0 %
EOS ABS: 188 {cells}/uL (ref 15–500)
Eosinophils Relative: 2 %
HEMATOCRIT: 38.4 % (ref 35.0–45.0)
Hemoglobin: 12.5 g/dL (ref 12.0–15.0)
LYMPHS PCT: 30 %
Lymphs Abs: 2820 cells/uL (ref 850–3900)
MCH: 28 pg (ref 27.0–33.0)
MCHC: 32.6 g/dL (ref 32.0–36.0)
MCV: 85.9 fL (ref 80.0–100.0)
MONO ABS: 658 {cells}/uL (ref 200–950)
MPV: 9.7 fL (ref 7.5–12.5)
Monocytes Relative: 7 %
Neutro Abs: 5734 cells/uL (ref 1500–7800)
Neutrophils Relative %: 61 %
Platelets: 271 10*3/uL (ref 140–400)
RBC: 4.47 MIL/uL (ref 3.80–5.10)
RDW: 15.3 % — AB (ref 11.0–15.0)
WBC: 9.4 10*3/uL (ref 3.8–10.8)

## 2016-06-21 LAB — COMPREHENSIVE METABOLIC PANEL
ALK PHOS: 98 U/L (ref 33–130)
ALT: 16 U/L (ref 6–29)
AST: 16 U/L (ref 10–35)
Albumin: 3.9 g/dL (ref 3.6–5.1)
BUN: 18 mg/dL (ref 7–25)
CALCIUM: 9.6 mg/dL (ref 8.6–10.4)
CO2: 28 mmol/L (ref 20–31)
Chloride: 102 mmol/L (ref 98–110)
Creat: 0.7 mg/dL (ref 0.50–1.05)
Glucose, Bld: 99 mg/dL (ref 70–99)
POTASSIUM: 4.2 mmol/L (ref 3.5–5.3)
Sodium: 139 mmol/L (ref 135–146)
TOTAL PROTEIN: 8 g/dL (ref 6.1–8.1)
Total Bilirubin: 0.6 mg/dL (ref 0.2–1.2)

## 2016-06-21 LAB — TSH: TSH: 0.69 m[IU]/L

## 2016-06-21 LAB — HEMOGLOBIN A1C
HEMOGLOBIN A1C: 6.3 % — AB (ref <5.7)
Mean Plasma Glucose: 134 mg/dL

## 2016-06-21 LAB — VITAMIN B12: VITAMIN B 12: 995 pg/mL (ref 200–1100)

## 2016-06-21 LAB — VITAMIN D 25 HYDROXY (VIT D DEFICIENCY, FRACTURES): VIT D 25 HYDROXY: 46 ng/mL (ref 30–100)

## 2016-06-21 LAB — RHEUMATOID FACTOR

## 2016-07-29 MED FILL — PARoxetine HCL 10 MG TABS: 10 | 30 days supply | Qty: 30 | Fill #1

## 2016-09-09 ENCOUNTER — Encounter: Payer: Self-pay | Admitting: Family Medicine

## 2016-09-09 ENCOUNTER — Ambulatory Visit (INDEPENDENT_AMBULATORY_CARE_PROVIDER_SITE_OTHER): Payer: 59 | Admitting: Family Medicine

## 2016-09-09 VITALS — BP 122/62 | HR 80 | Temp 98.2°F | Resp 16 | Ht 64.0 in | Wt 211.0 lb

## 2016-09-09 DIAGNOSIS — Z6836 Body mass index (BMI) 36.0-36.9, adult: Secondary | ICD-10-CM

## 2016-09-09 DIAGNOSIS — E6609 Other obesity due to excess calories: Secondary | ICD-10-CM

## 2016-09-09 DIAGNOSIS — N951 Menopausal and female climacteric states: Secondary | ICD-10-CM

## 2016-09-09 MED ORDER — PAROXETINE HCL 10 MG PO TABS
10.0000 mg | ORAL_TABLET | Freq: Every day | ORAL | 6 refills | Status: DC
Start: 2016-09-09 — End: 2017-01-13

## 2016-09-09 MED FILL — PARoxetine HCL 10 MG TABS: 10 | 30 days supply | Qty: 30 | Fill #0

## 2016-09-09 NOTE — Assessment & Plan Note (Signed)
Continue paxil and estroven good combination for her symptoms Discussed dietary changes, avoid snacks, sugars, processed foods

## 2016-09-09 NOTE — Patient Instructions (Signed)
F/U 4 months  

## 2016-09-09 NOTE — Progress Notes (Signed)
   Subjective:    [name], female    DOB: October 28, 1960, 56 y.o.   MRN: [id number]  Patient presents for 3 month F/U (is not fasting)     Pt here to f/u chronic medical problems, medications reviewed.    Last seen in Jan, started on paxil for menopausal symptoms, also on the estroven which has helped some menopausal symptoms  Can tell a difference with her mood on the paxil, she stried to stop as she was feeling better felt her self craving more sugar and stress eating and started gaining weight. Now back on both   No new concerns today         Review Of Systems:  GEN- denies fatigue, fever, weight loss,weakness, recent illness HEENT- denies eye drainage, change in vision, nasal discharge, CVS- denies chest pain, palpitations RESP- denies SOB, cough, wheeze ABD- denies N/V, change in stools, abd pain Neuro- denies headache, dizziness, syncope, seizure activity       Objective:    BP 122/62   Pulse 80   Temp 98.2 F (36.8 C) (Oral)   Resp 16   Ht  (1.626 m)   Wt 211 lb (95.7 kg)   SpO2 99%   BMI 36.22 kg/m  GEN- NAD, alert and oriented x3 CVS- RRR, no murmur RESP-CTAB Psych- normal affect and mood         Assessment & Plan:      Problem List Items Addressed This Visit    Obesity    approx 15 minutes spent with pt > 50% on counseling      Menopausal symptoms - Primary    Continue paxil and estroven good combination for her symptoms Discussed dietary changes, avoid snacks, sugars, processed foods         Note: This dictation was prepared with Dragon dictation along with smaller phrase technology. Any transcriptional errors that result from this process are unintentional.

## 2016-09-09 NOTE — Assessment & Plan Note (Signed)
approx 15 minutes spent with pt > 50% on counseling

## 2016-09-19 DIAGNOSIS — Z7689 Persons encountering health services in other specified circumstances: Secondary | ICD-10-CM | POA: Diagnosis not present

## 2016-09-19 DIAGNOSIS — Z0000 Encounter for general adult medical examination without abnormal findings: Secondary | ICD-10-CM | POA: Diagnosis not present

## 2016-09-20 DIAGNOSIS — H52223 Regular astigmatism, bilateral: Secondary | ICD-10-CM | POA: Diagnosis not present

## 2016-09-20 DIAGNOSIS — H524 Presbyopia: Secondary | ICD-10-CM | POA: Diagnosis not present

## 2016-09-20 DIAGNOSIS — H5213 Myopia, bilateral: Secondary | ICD-10-CM | POA: Diagnosis not present

## 2017-01-11 ENCOUNTER — Ambulatory Visit: Payer: 59 | Admitting: Family Medicine

## 2017-01-13 ENCOUNTER — Encounter: Payer: Self-pay | Admitting: Family Medicine

## 2017-01-13 ENCOUNTER — Ambulatory Visit (INDEPENDENT_AMBULATORY_CARE_PROVIDER_SITE_OTHER): Payer: 59 | Admitting: Family Medicine

## 2017-01-13 VITALS — BP 118/60 | HR 78 | Temp 98.5°F | Resp 16 | Ht 64.0 in | Wt 213.0 lb

## 2017-01-13 DIAGNOSIS — E6609 Other obesity due to excess calories: Secondary | ICD-10-CM | POA: Diagnosis not present

## 2017-01-13 DIAGNOSIS — R7302 Impaired glucose tolerance (oral): Secondary | ICD-10-CM | POA: Diagnosis not present

## 2017-01-13 DIAGNOSIS — E782 Mixed hyperlipidemia: Secondary | ICD-10-CM | POA: Diagnosis not present

## 2017-01-13 DIAGNOSIS — E785 Hyperlipidemia, unspecified: Secondary | ICD-10-CM | POA: Insufficient documentation

## 2017-01-13 DIAGNOSIS — Z6836 Body mass index (BMI) 36.0-36.9, adult: Secondary | ICD-10-CM | POA: Diagnosis not present

## 2017-01-13 LAB — CBC WITH DIFFERENTIAL/PLATELET
Basophils Absolute: 0 cells/uL (ref 0–200)
Basophils Relative: 0 %
Eosinophils Absolute: 164 cells/uL (ref 15–500)
Eosinophils Relative: 2 %
HCT: 40.4 % (ref 35.0–45.0)
HEMOGLOBIN: 13.2 g/dL (ref 12.0–15.0)
LYMPHS ABS: 2214 {cells}/uL (ref 850–3900)
Lymphocytes Relative: 27 %
MCH: 30 pg (ref 27.0–33.0)
MCHC: 32.7 g/dL (ref 32.0–36.0)
MCV: 91.8 fL (ref 80.0–100.0)
MONOS PCT: 7 %
MPV: 9.8 fL (ref 7.5–12.5)
Monocytes Absolute: 574 cells/uL (ref 200–950)
NEUTROS ABS: 5248 {cells}/uL (ref 1500–7800)
Neutrophils Relative %: 64 %
PLATELETS: 247 10*3/uL (ref 140–400)
RBC: 4.4 MIL/uL (ref 3.80–5.10)
RDW: 14.3 % (ref 11.0–15.0)
WBC: 8.2 10*3/uL (ref 3.8–10.8)

## 2017-01-13 NOTE — Assessment & Plan Note (Signed)
Recheck lipids, discussed risk for  CAD, with blood sugar, lipids and obesity

## 2017-01-13 NOTE — Patient Instructions (Signed)
F/U 6 months

## 2017-01-13 NOTE — Progress Notes (Signed)
   Subjective:    [name], female    DOB: 1961-05-06, 56 y.o.   MRN: [id number]  Patient presents for 4 month F/U (is fasting) Patient here to follow-up medications. In January she was started on Paxil for menopausal symptoms of slow over-the-counter estrogen to help with hot flashes and cravings. She is here to follow-up his medications. She weaned off paxil, feels good, still taking the Estroven    Hyperlipidemia she is due for repeat on her cholesterol LDL was elevated at 159 January total cholesterol 226   Diabetes mellitus her A1c was elevated at 6.3% 6 months ago her weight is actually up 6 pounds since January She is working with [employer]- works out 3 days a week, eats about 1400-1500 calories a day , doing protein shake 4 days week,  Weight has still between 211-216 Had Eye checked recently due to blurry vision but told it was okay, has some fatigue during the day . Sleeping well   Review Of Systems:  GEN- + fatigue, fever, weight loss,weakness, recent illness HEENT- denies eye drainage, change in vision, nasal discharge, CVS- denies chest pain, palpitations RESP- denies SOB, cough, wheeze ABD- denies N/V, change in stools, abd pain GU- denies dysuria, hematuria, dribbling, incontinence MSK- denies joint pain, muscle aches, injury Neuro- denies headache, dizziness, syncope, seizure activity       Objective:    BP 118/60   Pulse 78   Temp 98.5 F (36.9 C) (Oral)   Resp 16   Ht 5\' 4"  (1.626 m)   Wt 213 lb (96.6 kg)   SpO2 97%   BMI 36.56 kg/m  GEN- NAD, alert and oriented x3 HEENT- PERRL, EOMI, non injected sclera, pink conjunctiva, MMM, oropharynx clear Neck- Supple, no thyromegaly CVS- RRR, no murmur RESP-CTAB EXT- No edema Psych- normal affect and mood  Pulses- Radial 2+        Assessment & Plan:      Problem List Items Addressed This Visit      Unprioritized   Obesity   Hyperlipidemia    Recheck lipids, discussed risk  for  CAD, with blood sugar, lipids and obesity        Relevant Orders   Comprehensive metabolic panel   Lipid panel   Glucose intolerance (impaired glucose tolerance) - Primary    Recheck her A1c concerned that with her weight gain she may have tipped over into becoming diabetic. Discussed that she needs to decrease her carb intake as well. Depending on her results are thing she would be a good candidate for Saxenda which we did briefly discuss. She has been on Victoza in  the past.      Relevant Orders   Hemoglobin A1c   CBC with Differential/Platelet      Note: This dictation was prepared with Dragon dictation along with smaller phrase technology. Any transcriptional errors that result from this process are unintentional.

## 2017-01-13 NOTE — Assessment & Plan Note (Signed)
Recheck her A1c concerned that with her weight gain she may have tipped over into becoming diabetic. Discussed that she needs to decrease her carb intake as well. Depending on her results are thing she would be a good candidate for Saxenda which we did briefly discuss. She has been on Victoza in  the past.

## 2017-01-14 LAB — COMPREHENSIVE METABOLIC PANEL
ALT: 15 U/L (ref 6–29)
AST: 15 U/L (ref 10–35)
Albumin: 3.9 g/dL (ref 3.6–5.1)
Alkaline Phosphatase: 92 U/L (ref 33–130)
BUN: 16 mg/dL (ref 7–25)
CHLORIDE: 104 mmol/L (ref 98–110)
CO2: 25 mmol/L (ref 20–32)
CREATININE: 0.66 mg/dL (ref 0.50–1.05)
Calcium: 9 mg/dL (ref 8.6–10.4)
Glucose, Bld: 105 mg/dL — ABNORMAL HIGH (ref 70–99)
POTASSIUM: 4.4 mmol/L (ref 3.5–5.3)
SODIUM: 139 mmol/L (ref 135–146)
Total Bilirubin: 0.7 mg/dL (ref 0.2–1.2)
Total Protein: 7 g/dL (ref 6.1–8.1)

## 2017-01-14 LAB — LIPID PANEL
CHOL/HDL RATIO: 5.5 ratio — AB (ref <5.0)
Cholesterol: 199 mg/dL (ref <200)
HDL: 36 mg/dL — AB (ref >50)
LDL CALC: 142 mg/dL — AB (ref <100)
TRIGLYCERIDES: 107 mg/dL (ref <150)
VLDL: 21 mg/dL (ref <30)

## 2017-01-14 LAB — HEMOGLOBIN A1C
HEMOGLOBIN A1C: 6 % — AB (ref <5.7)
MEAN PLASMA GLUCOSE: 126 mg/dL

## 2017-01-16 ENCOUNTER — Other Ambulatory Visit: Payer: Self-pay | Admitting: *Deleted

## 2017-01-16 ENCOUNTER — Telehealth: Payer: Self-pay | Admitting: *Deleted

## 2017-01-16 ENCOUNTER — Encounter: Payer: Self-pay | Admitting: Family Medicine

## 2017-01-16 MED ORDER — INSULIN PEN NEEDLE 32G X 6 MM MISC: 3 refills | Status: DC

## 2017-01-16 MED ORDER — LIRAGLUTIDE -WEIGHT MANAGEMENT 18 MG/3ML ~~LOC~~ SOPN: 0.6000 mg | PEN_INJECTOR | Freq: Every day | SUBCUTANEOUS | 11 refills | Status: DC

## 2017-01-16 NOTE — Telephone Encounter (Signed)
[employer] Medication Request Form   Plan Contact [phone] phone [phone] fax  Wait for Determination Please wait for the plan to return a determination.

## 2017-01-16 NOTE — Telephone Encounter (Addendum)
Received request from pharmacy for PA on Saxenda.   BMI 36.45- weight 213lb.  PA submitted.   Dx: E66.01- obesity.

## 2017-01-19 MED ORDER — LIRAGLUTIDE -WEIGHT MANAGEMENT 18 MG/3ML ~~LOC~~ SOPN: 0.6000 mg | PEN_INJECTOR | Freq: Every day | SUBCUTANEOUS | 11 refills | Status: DC

## 2017-01-19 MED FILL — SAXENDA 18 MG/3 ML PEN: 18 | 30 days supply | Qty: 15 | Fill #0

## 2017-01-19 MED FILL — UNIFINE PENTIPS 31GX3/16": 31G X 5 MM | 30 days supply | Qty: 100 | Fill #0

## 2017-01-19 MED FILL — UNIFINE PENTIPS 31GX3/16: 31G X 5 MM | 30 days supply | Qty: 100 | Fill #0

## 2017-01-19 NOTE — Telephone Encounter (Signed)
Received PA determination.   PA [id number] approved 01/16/2017- 12/26/218.  Renewal requires a weight los of at least 4% of baseline body weight after 4 months.  Pharmacy made aware.   Call placed to patient and patient made aware.

## 2017-03-03 MED FILL — SAXENDA 18 MG/3 ML PEN: 18 | 30 days supply | Qty: 15 | Fill #1

## 2017-04-05 MED FILL — SAXENDA 18 MG/3 ML PEN: 18 | 30 days supply | Qty: 15 | Fill #2

## 2017-04-18 DIAGNOSIS — Z1231 Encounter for screening mammogram for malignant neoplasm of breast: Secondary | ICD-10-CM | POA: Diagnosis not present

## 2017-04-18 LAB — HM MAMMOGRAPHY: HM MAMMO: ABNORMAL — AB (ref 0–4)

## 2017-04-20 ENCOUNTER — Encounter: Payer: Self-pay | Admitting: Family Medicine

## 2017-04-26 ENCOUNTER — Encounter: Payer: Self-pay | Admitting: Family Medicine

## 2017-04-26 DIAGNOSIS — R921 Mammographic calcification found on diagnostic imaging of breast: Secondary | ICD-10-CM | POA: Diagnosis not present

## 2017-04-27 ENCOUNTER — Encounter: Payer: Self-pay | Admitting: Family Medicine

## 2017-05-08 ENCOUNTER — Encounter: Payer: Self-pay | Admitting: Family Medicine

## 2017-05-08 ENCOUNTER — Telehealth: Payer: Self-pay | Admitting: *Deleted

## 2017-05-08 ENCOUNTER — Other Ambulatory Visit: Payer: Self-pay | Admitting: Radiology

## 2017-05-08 DIAGNOSIS — N6012 Diffuse cystic mastopathy of left breast: Secondary | ICD-10-CM | POA: Diagnosis not present

## 2017-05-08 DIAGNOSIS — Z0000 Encounter for general adult medical examination without abnormal findings: Secondary | ICD-10-CM | POA: Diagnosis not present

## 2017-05-08 DIAGNOSIS — R921 Mammographic calcification found on diagnostic imaging of breast: Secondary | ICD-10-CM | POA: Diagnosis not present

## 2017-05-08 DIAGNOSIS — R920 Mammographic microcalcification found on diagnostic imaging of breast: Secondary | ICD-10-CM | POA: Diagnosis not present

## 2017-05-08 LAB — HM MAMMOGRAPHY

## 2017-05-08 MED FILL — SAXENDA 18 MG/3 ML PEN: 18 | 30 days supply | Qty: 15 | Fill #3

## 2017-05-08 NOTE — Telephone Encounter (Signed)
Received PA request for Saxenda. Patient need to come into office for weight check. Nurse visit scheduled.

## 2017-05-11 ENCOUNTER — Ambulatory Visit: Payer: 59 | Admitting: Family Medicine

## 2017-05-11 VITALS — Wt 195.6 lb

## 2017-05-11 DIAGNOSIS — E6609 Other obesity due to excess calories: Secondary | ICD-10-CM

## 2017-05-11 DIAGNOSIS — Z6836 Body mass index (BMI) 36.0-36.9, adult: Principal | ICD-10-CM

## 2017-05-15 ENCOUNTER — Encounter: Payer: Self-pay | Admitting: *Deleted

## 2017-05-15 ENCOUNTER — Telehealth: Payer: Self-pay | Admitting: *Deleted

## 2017-05-15 NOTE — Telephone Encounter (Signed)
Received request from pharmacy for PA on Saxenda.   PA submitted.   Dx: E66.09- obesity.   Weight 8/27- 213lb/ 36.54 BMI Weight 12/20- 195lb/ 33.57 BMI Weight loss- 8.45%

## 2017-05-15 NOTE — Telephone Encounter (Signed)
Your PA has been faxed to the plan as a paper copy. Please contact the plan directly if you haven't received a determination in a typical timeframe.  You will be notified of the determination electronically and via fax. 

## 2017-05-19 NOTE — Telephone Encounter (Signed)
Received PA determination.   PA approved 05/15/2017- 05/19/2018.  Pharmacy made aware.

## 2017-06-15 MED FILL — SAXENDA 18 MG/3 ML PEN: 18 | 30 days supply | Qty: 15 | Fill #4

## 2017-06-22 DIAGNOSIS — Z6833 Body mass index (BMI) 33.0-33.9, adult: Secondary | ICD-10-CM | POA: Diagnosis not present

## 2017-06-22 DIAGNOSIS — Z01419 Encounter for gynecological examination (general) (routine) without abnormal findings: Secondary | ICD-10-CM | POA: Diagnosis not present

## 2017-07-17 ENCOUNTER — Ambulatory Visit: Payer: 59 | Admitting: Family Medicine

## 2017-07-24 MED FILL — SAXENDA 18 MG/3 ML PEN: 18 | 30 days supply | Qty: 15 | Fill #5

## 2017-08-01 ENCOUNTER — Ambulatory Visit (INDEPENDENT_AMBULATORY_CARE_PROVIDER_SITE_OTHER): Payer: 59 | Admitting: Family Medicine

## 2017-08-01 ENCOUNTER — Encounter: Payer: Self-pay | Admitting: Family Medicine

## 2017-08-01 ENCOUNTER — Other Ambulatory Visit: Payer: Self-pay | Admitting: *Deleted

## 2017-08-01 ENCOUNTER — Other Ambulatory Visit: Payer: Self-pay

## 2017-08-01 VITALS — BP 110/64 | HR 66 | Temp 97.9°F | Resp 14 | Ht 64.0 in | Wt 190.0 lb

## 2017-08-01 DIAGNOSIS — E782 Mixed hyperlipidemia: Secondary | ICD-10-CM

## 2017-08-01 DIAGNOSIS — E6609 Other obesity due to excess calories: Secondary | ICD-10-CM | POA: Diagnosis not present

## 2017-08-01 DIAGNOSIS — R7302 Impaired glucose tolerance (oral): Secondary | ICD-10-CM | POA: Diagnosis not present

## 2017-08-01 DIAGNOSIS — Z0000 Encounter for general adult medical examination without abnormal findings: Secondary | ICD-10-CM

## 2017-08-01 DIAGNOSIS — Z6832 Body mass index (BMI) 32.0-32.9, adult: Secondary | ICD-10-CM | POA: Diagnosis not present

## 2017-08-01 DIAGNOSIS — E559 Vitamin D deficiency, unspecified: Secondary | ICD-10-CM | POA: Diagnosis not present

## 2017-08-01 MED ORDER — VITAMIN D 50 MCG (2000 UT) PO TABS: 2000.0000 [IU] | ORAL_TABLET | Freq: Every day | ORAL | Status: DC

## 2017-08-01 MED ORDER — ZOSTER VAC RECOMB ADJUVANTED 50 MCG/0.5ML IM SUSR: 0.5000 mL | Freq: Once | INTRAMUSCULAR | 0 refills | Status: AC

## 2017-08-01 NOTE — Progress Notes (Signed)
   Subjective:    [name], female    DOB: June 15, 1960, 57 y.o.   MRN: [id number]  Patient presents for CPE (is fasting)   Pt here for CPE  And fasting labs  [name] - PAP Smear , told it was normal   GI- Dr. Loreta AveMann - told 10 years had done 2015  Mammogram - UTD , had biopsy of left breast in December  Had fibrous tissue    Immunizations- UTD, flu shot at work    Glucose intolerance- continues to work on weight loss, now down to 190lbs with weight loss and exercise. She is on Saxenda , started weight loss journey at 213lbs   working out 3 days a week 1 hour each time   Protein shakes for breakfast and then regular meals throughout the day    Tracking food and calories on the APP   Follows with Dentist   No concerns, feels well     Review Of Systems:  GEN- denies fatigue, fever, weight loss,weakness, recent illness HEENT- denies eye drainage, change in vision, nasal discharge, CVS- denies chest pain, palpitations RESP- denies SOB, cough, wheeze ABD- denies N/V, change in stools, abd pain GU- denies dysuria, hematuria, dribbling, incontinence MSK- denies joint pain, muscle aches, injury Neuro- denies headache, dizziness, syncope, seizure activity       Objective:    BP 110/64   Pulse 66   Temp 97.9 F (36.6 C) (Oral)   Resp 14   Ht 5\' 4"  (1.626 m)   Wt 190 lb (86.2 kg)   SpO2 97%   BMI 32.61 kg/m  GEN- NAD, alert and oriented x3 HEENT- PERRL, EOMI, non injected sclera, pink conjunctiva, MMM, oropharynx clear Neck- Supple, no thyromegaly CVS- RRR, no murmur RESP-CTAB ABD-NABS,soft,NT,ND EXT- No edema Pulses- Radial, DP- 2+        Assessment & Plan:      Problem List Items Addressed This Visit      Unprioritized   Vitamin D deficiency   Relevant Orders   Vitamin D, 25-hydroxy   Obesity   Hyperlipidemia   Relevant Orders   Lipid panel   Glucose intolerance (impaired glucose tolerance)   Relevant Orders   Hemoglobin A1c    Other Visit Diagnoses    Routine general medical examination at a health care facility    -  Primary   CPE done, fasting labs, continues to work on diet, goal 170lbs. Continue Saxenda, obtain colonoscopy report and PAP report. f/u 5 months Shingles vaccine sent to pharmacy    Relevant Orders   CBC with Differential/Platelet   Comprehensive metabolic panel   Lipid panel      Note: This dictation was prepared with Dragon dictation along with smaller phrase technology. Any transcriptional errors that result from this process are unintentional.

## 2017-08-01 NOTE — Patient Instructions (Addendum)
Shingles vaccine sent to pharmacy  I recommend eye visit once a year I recommend dental visit every 6 months Goal is to  Exercise 30 minutes 5 days a week We will call with lab results  F/U August for medications

## 2017-08-02 LAB — COMPREHENSIVE METABOLIC PANEL
AG Ratio: 1.2 (calc) (ref 1.0–2.5)
ALBUMIN MSPROF: 4.2 g/dL (ref 3.6–5.1)
ALT: 14 U/L (ref 6–29)
AST: 15 U/L (ref 10–35)
Alkaline phosphatase (APISO): 95 U/L (ref 33–130)
BUN: 17 mg/dL (ref 7–25)
CHLORIDE: 103 mmol/L (ref 98–110)
CO2: 31 mmol/L (ref 20–32)
CREATININE: 0.83 mg/dL (ref 0.50–1.05)
Calcium: 9.5 mg/dL (ref 8.6–10.4)
GLOBULIN: 3.4 g/dL (ref 1.9–3.7)
Glucose, Bld: 93 mg/dL (ref 65–99)
POTASSIUM: 4.2 mmol/L (ref 3.5–5.3)
Sodium: 141 mmol/L (ref 135–146)
Total Bilirubin: 0.6 mg/dL (ref 0.2–1.2)
Total Protein: 7.6 g/dL (ref 6.1–8.1)

## 2017-08-02 LAB — CBC WITH DIFFERENTIAL/PLATELET
Basophils Absolute: 32 cells/uL (ref 0–200)
Basophils Relative: 0.4 %
EOS ABS: 245 {cells}/uL (ref 15–500)
Eosinophils Relative: 3.1 %
HCT: 41.1 % (ref 35.0–45.0)
Hemoglobin: 13.8 g/dL (ref 11.7–15.5)
Lymphs Abs: 2101 cells/uL (ref 850–3900)
MCH: 29.9 pg (ref 27.0–33.0)
MCHC: 33.6 g/dL (ref 32.0–36.0)
MCV: 89.2 fL (ref 80.0–100.0)
MPV: 10.1 fL (ref 7.5–12.5)
Monocytes Relative: 7.1 %
Neutro Abs: 4961 cells/uL (ref 1500–7800)
Neutrophils Relative %: 62.8 %
PLATELETS: 235 10*3/uL (ref 140–400)
RBC: 4.61 10*6/uL (ref 3.80–5.10)
RDW: 12.4 % (ref 11.0–15.0)
TOTAL LYMPHOCYTE: 26.6 %
WBC: 7.9 10*3/uL (ref 3.8–10.8)
WBCMIX: 561 {cells}/uL (ref 200–950)

## 2017-08-02 LAB — VITAMIN D 25 HYDROXY (VIT D DEFICIENCY, FRACTURES): Vit D, 25-Hydroxy: 42 ng/mL (ref 30–100)

## 2017-08-02 LAB — HEMOGLOBIN A1C
HEMOGLOBIN A1C: 5.7 %{Hb} — AB (ref <5.7)
Mean Plasma Glucose: 117 (calc)
eAG (mmol/L): 6.5 (calc)

## 2017-08-02 LAB — LIPID PANEL
CHOLESTEROL: 185 mg/dL (ref <200)
HDL: 40 mg/dL — AB (ref >50)
LDL CHOLESTEROL (CALC): 124 mg/dL — AB
NON-HDL CHOLESTEROL (CALC): 145 mg/dL — AB (ref <130)
Total CHOL/HDL Ratio: 4.6 (calc) (ref <5.0)
Triglycerides: 104 mg/dL (ref <150)

## 2017-08-08 ENCOUNTER — Encounter: Payer: Self-pay | Admitting: *Deleted

## 2017-08-11 MED FILL — SHINGRIX 50 MCG SUS: 50 | 1 days supply | Qty: 1 | Fill #0

## 2017-08-28 MED FILL — SAXENDA 18 MG/3 ML PEN: 18 | 30 days supply | Qty: 15 | Fill #6

## 2017-10-06 MED FILL — SAXENDA 18 MG/3 ML PEN: 18 | 30 days supply | Qty: 15 | Fill #7

## 2017-10-12 MED FILL — SHINGRIX 50 MCG SUS: 50 | 1 days supply | Qty: 1 | Fill #1

## 2017-11-06 MED FILL — SAXENDA 18 MG/3 ML PEN: 18 | 30 days supply | Qty: 15 | Fill #8

## 2017-12-18 MED FILL — SAXENDA 18 MG/3 ML PEN: 18 | 30 days supply | Qty: 15 | Fill #9

## 2017-12-27 ENCOUNTER — Ambulatory Visit (INDEPENDENT_AMBULATORY_CARE_PROVIDER_SITE_OTHER): Payer: 59 | Admitting: Family Medicine

## 2017-12-27 ENCOUNTER — Other Ambulatory Visit: Payer: Self-pay

## 2017-12-27 ENCOUNTER — Encounter: Payer: Self-pay | Admitting: Family Medicine

## 2017-12-27 VITALS — BP 126/74 | HR 76 | Temp 98.7°F | Resp 12 | Ht 64.0 in | Wt 189.0 lb

## 2017-12-27 DIAGNOSIS — E782 Mixed hyperlipidemia: Secondary | ICD-10-CM

## 2017-12-27 DIAGNOSIS — E6609 Other obesity due to excess calories: Secondary | ICD-10-CM

## 2017-12-27 DIAGNOSIS — Z6832 Body mass index (BMI) 32.0-32.9, adult: Secondary | ICD-10-CM

## 2017-12-27 NOTE — Progress Notes (Signed)
   Subjective:    [name], female    DOB: 07-Apr-1961, 57 y.o.   MRN: [id number]  Patient presents for Follow-up (is fasting)   Pt here to f/u weight on Saxenda, still working out 3-4 days a week, but feels she has hit a platuea, she has cut down portion sizes and carbs.  She had gained weight in July 4-5 pounds but worked them off.  Gets bloated after eating bread     A1C was normal at 5.7%  Doing wellness APP with work at [employer]   Feels well no new concerns     Review Of Systems:  GEN- denies fatigue, fever, weight loss,weakness, recent illness HEENT- denies eye drainage, change in vision, nasal discharge, CVS- denies chest pain, palpitations RESP- denies SOB, cough, wheeze ABD- denies N/V, change in stools, abd pain GU- denies dysuria, hematuria, dribbling, incontinence MSK- denies joint pain, muscle aches, injury Neuro- denies headache, dizziness, syncope, seizure activity       Objective:    BP 126/74   Pulse 76   Temp 98.7 F (37.1 C) (Oral)   Resp 12   Ht 5\' 4"  (1.626 m)   Wt 189 lb (85.7 kg)   SpO2 99%   BMI 32.44 kg/m  GEN- NAD, alert and oriented x3 HEENT- PERRL, EOMI, non injected sclera, pink conjunctiva, MMM, oropharynx clear Neck- Supple, no thyromegaly CVS- RRR, no murmur RESP-CTAB ABD-NABS,soft,NT,ND EXT- No edema Pulses- Radial, DP- 2+        Assessment & Plan:      Problem List Items Addressed This Visit      Unprioritized   Hyperlipidemia    Plan to recheck fasting labs at next visit  No current meds Diet       Obesity - Primary    Continue with Victoria CoveySaxenda currently around a year has kept off 23lbs Continue exercise and diet routine Likely in a plateua but will continue to push through with little tweaks in work out diet Last A1C normal!      Relevant Medications   Liraglutide -Weight Management (SAXENDA) 18 MG/3ML SOPN      Note: This dictation was prepared with Dragon dictation along with smaller  phrase technology. Any transcriptional errors that result from this process are unintentional.

## 2017-12-27 NOTE — Patient Instructions (Signed)
F/U Schedule physical March  F/U Weight check in December

## 2017-12-28 ENCOUNTER — Encounter: Payer: Self-pay | Admitting: Family Medicine

## 2017-12-28 ENCOUNTER — Encounter: Payer: Self-pay | Admitting: *Deleted

## 2017-12-28 MED ORDER — LIRAGLUTIDE -WEIGHT MANAGEMENT 18 MG/3ML ~~LOC~~ SOPN: 0.6000 mg | PEN_INJECTOR | Freq: Every day | SUBCUTANEOUS | 11 refills | Status: DC

## 2017-12-28 NOTE — Assessment & Plan Note (Signed)
Plan to recheck fasting labs at next visit  No current meds Diet

## 2017-12-28 NOTE — Assessment & Plan Note (Signed)
Continue with Victoria CoveySaxenda currently around a year has kept off 23lbs Continue exercise and diet routine Likely in a plateua but will continue to push through with little tweaks in work out diet Last A1C normal!

## 2018-01-15 MED FILL — SAXENDA 18 MG/3 ML PEN: 18 | 30 days supply | Qty: 15 | Fill #10

## 2018-01-17 ENCOUNTER — Encounter: Payer: Self-pay | Admitting: *Deleted

## 2018-02-18 ENCOUNTER — Encounter: Payer: Self-pay | Admitting: Family Medicine

## 2018-02-28 MED FILL — SAXENDA 18 MG/3 ML PEN: 18 | 30 days supply | Qty: 15 | Fill #0 | Status: TO

## 2018-03-02 DIAGNOSIS — R921 Mammographic calcification found on diagnostic imaging of breast: Secondary | ICD-10-CM | POA: Diagnosis not present

## 2018-03-02 DIAGNOSIS — R922 Inconclusive mammogram: Secondary | ICD-10-CM | POA: Diagnosis not present

## 2018-03-02 LAB — HM MAMMOGRAPHY

## 2018-03-05 ENCOUNTER — Encounter: Payer: Self-pay | Admitting: *Deleted

## 2018-04-23 ENCOUNTER — Ambulatory Visit (INDEPENDENT_AMBULATORY_CARE_PROVIDER_SITE_OTHER): Payer: 59 | Admitting: Family Medicine

## 2018-04-23 ENCOUNTER — Encounter: Payer: Self-pay | Admitting: Family Medicine

## 2018-04-23 VITALS — BP 106/68 | HR 80 | Temp 98.6°F | Resp 16 | Ht 64.0 in | Wt 191.0 lb

## 2018-04-23 DIAGNOSIS — E6609 Other obesity due to excess calories: Secondary | ICD-10-CM | POA: Diagnosis not present

## 2018-04-23 DIAGNOSIS — Z6832 Body mass index (BMI) 32.0-32.9, adult: Secondary | ICD-10-CM

## 2018-04-23 DIAGNOSIS — E782 Mixed hyperlipidemia: Secondary | ICD-10-CM | POA: Diagnosis not present

## 2018-04-23 DIAGNOSIS — F411 Generalized anxiety disorder: Secondary | ICD-10-CM | POA: Diagnosis not present

## 2018-04-23 MED ORDER — PAROXETINE HCL 10 MG PO TABS: 10.0000 mg | ORAL_TABLET | Freq: Every day | ORAL | 3 refills | Status: DC

## 2018-04-23 NOTE — Assessment & Plan Note (Signed)
See the Saxenda at the current dose she is the 23 to 25 pounds Consistency with her exercise program which I think will help with her stress levels as well as her weight

## 2018-04-23 NOTE — Progress Notes (Signed)
   Subjective:    [name], female    DOB: March 17, 1961, 57 y.o.   MRN: [id number]  Patient presents for Weight Check   Pt here to f/u Saxenda, she was started on this in  August 2018. Starting weight was 213lbs  Body mass index is 32.79 kg/m.   Current Weight 191lbs   March  2019 weight 190   August 2019 - 189  She had glucose intolerance in the past with elevated A1C, last A1C 5.7% in March at her physical    Still working out 3-4 times a week, she often walks in the morning 1-1.5 miles, but being consisent with it  Has been the issue which she feels been stalling her further weight loss.   She does the wellness yearly program for [employer]  Admits to increased anxiety and irritability.  She was on Paxil in the past which helped control her symptoms in the setting of her postmenopausal disorder.  She states for the past 6 months or so she has noticed a change in her personality she snaps quicker she feels on edge.  She does not sleep well when she is using essential oils.  Her husband does notice a difference as well.  She denies feelings of depression    Review Of Systems:  GEN- denies fatigue, fever, weight loss,weakness, recent illness HEENT- denies eye drainage, change in vision, nasal discharge, CVS- denies chest pain, palpitations RESP- denies SOB, cough, wheeze ABD- denies N/V, change in stools, abd pain GU- denies dysuria, hematuria, dribbling, incontinence MSK- denies joint pain, muscle aches, injury Neuro- denies headache, dizziness, syncope, seizure activity       Objective:    BP 106/68   Pulse 80   Temp 98.6 F (37 C) (Oral)   Resp 16   Ht 5\' 4"  (1.626 m)   Wt 191 lb (86.6 kg)   SpO2 97%   BMI 32.79 kg/m  GEN- NAD, alert and oriented x3 HEENT- PERRL, EOMI, non injected sclera, pink conjunctiva, MMM, oropharynx clear Neck- Supple, no thyromegaly CVS- RRR, no murmur RESP-CTAB Psych- normal affect and mood, no SI EXT- No  edema Pulses- Radial  2+  PHQ9 Score 3       Assessment & Plan:      Problem List Items Addressed This Visit      Unprioritized   GAD (generalized anxiety disorder)    Restart Paxil 10 mg at bedtime      Relevant Medications   PARoxetine (PAXIL) 10 MG tablet   Hyperlipidemia    Check her lipid panel as well as metabolic panel.  No current medication or cholesterol trying to maintain with weight and diet      Relevant Orders   Lipid panel   Comprehensive metabolic panel   Obesity - Primary    See the Saxenda at the current dose she is the 23 to 25 pounds Consistency with her exercise program which I think will help with her stress levels as well as her weight      Relevant Orders   Lipid panel   Comprehensive metabolic panel      Note: This dictation was prepared with Dragon dictation along with smaller phrase technology. Any transcriptional errors that result from this process are unintentional.

## 2018-04-23 NOTE — Assessment & Plan Note (Signed)
Restart Paxil 10 mg at bedtime

## 2018-04-23 NOTE — Patient Instructions (Addendum)
F/U march for Physical Start the paxil at bedtime

## 2018-04-23 NOTE — Assessment & Plan Note (Signed)
Check her lipid panel as well as metabolic panel.  No current medication or cholesterol trying to maintain with weight and diet

## 2018-04-24 LAB — COMPREHENSIVE METABOLIC PANEL
AG Ratio: 1.2 (calc) (ref 1.0–2.5)
ALT: 17 U/L (ref 6–29)
AST: 17 U/L (ref 10–35)
Albumin: 4 g/dL (ref 3.6–5.1)
Alkaline phosphatase (APISO): 98 U/L (ref 33–130)
BUN: 14 mg/dL (ref 7–25)
CO2: 29 mmol/L (ref 20–32)
Calcium: 9.4 mg/dL (ref 8.6–10.4)
Chloride: 104 mmol/L (ref 98–110)
Creat: 0.68 mg/dL (ref 0.50–1.05)
Globulin: 3.4 g/dL (calc) (ref 1.9–3.7)
Glucose, Bld: 81 mg/dL (ref 65–99)
Potassium: 4.1 mmol/L (ref 3.5–5.3)
SODIUM: 142 mmol/L (ref 135–146)
Total Bilirubin: 0.5 mg/dL (ref 0.2–1.2)
Total Protein: 7.4 g/dL (ref 6.1–8.1)

## 2018-04-24 LAB — LIPID PANEL
Cholesterol: 199 mg/dL (ref <200)
HDL: 42 mg/dL — ABNORMAL LOW (ref >50)
LDL Cholesterol (Calc): 137 mg/dL (calc) — ABNORMAL HIGH
Non-HDL Cholesterol (Calc): 157 mg/dL (calc) — ABNORMAL HIGH (ref <130)
Total CHOL/HDL Ratio: 4.7 (calc) (ref <5.0)
Triglycerides: 92 mg/dL (ref <150)

## 2018-04-26 ENCOUNTER — Encounter: Payer: Self-pay | Admitting: *Deleted

## 2018-05-10 ENCOUNTER — Telehealth: Payer: Self-pay | Admitting: *Deleted

## 2018-05-10 NOTE — Telephone Encounter (Signed)
Received request from pharmacy for PA on Saxenda.   PA submitted.   Dx: E66.09- obesity.   Weight 01/16/17- 213lb/ 36.54 BMI Weight 04/23/2018- 191bb/ 32BMI Weight loss- 10.33%

## 2018-05-10 NOTE — Telephone Encounter (Signed)
Your information has been sent to [employer]. Please wait for [employer] to return a determination. 

## 2018-05-14 MED ORDER — LIRAGLUTIDE -WEIGHT MANAGEMENT 18 MG/3ML ~~LOC~~ SOPN: 0.6000 mg | PEN_INJECTOR | Freq: Every day | SUBCUTANEOUS | 11 refills | Status: DC

## 2018-05-14 MED ORDER — INSULIN PEN NEEDLE 32G X 6 MM MISC: 3 refills | Status: DC

## 2018-05-14 MED FILL — UNIFINE PENTIPS 6MM 31G: 31G X 6 MM | 90 days supply | Qty: 100 | Fill #0

## 2018-05-14 NOTE — Telephone Encounter (Signed)
Received PA determination.   PA [id number] approved 05/17/2018- 05/17/2019.  Pharmacy made aware.

## 2018-07-25 ENCOUNTER — Ambulatory Visit (INDEPENDENT_AMBULATORY_CARE_PROVIDER_SITE_OTHER): Payer: 59 | Admitting: Family Medicine

## 2018-07-25 ENCOUNTER — Encounter: Payer: Self-pay | Admitting: Family Medicine

## 2018-07-25 ENCOUNTER — Other Ambulatory Visit: Payer: Self-pay

## 2018-07-25 VITALS — BP 102/60 | HR 66 | Temp 98.1°F | Resp 12 | Ht 64.0 in | Wt 194.0 lb

## 2018-07-25 DIAGNOSIS — F411 Generalized anxiety disorder: Secondary | ICD-10-CM

## 2018-07-25 DIAGNOSIS — Z6832 Body mass index (BMI) 32.0-32.9, adult: Secondary | ICD-10-CM | POA: Diagnosis not present

## 2018-07-25 DIAGNOSIS — E6609 Other obesity due to excess calories: Secondary | ICD-10-CM

## 2018-07-25 DIAGNOSIS — N898 Other specified noninflammatory disorders of vagina: Secondary | ICD-10-CM | POA: Diagnosis not present

## 2018-07-25 DIAGNOSIS — E782 Mixed hyperlipidemia: Secondary | ICD-10-CM | POA: Diagnosis not present

## 2018-07-25 DIAGNOSIS — Z124 Encounter for screening for malignant neoplasm of cervix: Secondary | ICD-10-CM

## 2018-07-25 DIAGNOSIS — Z0000 Encounter for general adult medical examination without abnormal findings: Secondary | ICD-10-CM

## 2018-07-25 DIAGNOSIS — R7302 Impaired glucose tolerance (oral): Secondary | ICD-10-CM

## 2018-07-25 LAB — WET PREP FOR TRICH, YEAST, CLUE

## 2018-07-25 MED ORDER — PAROXETINE HCL 10 MG PO TABS: 10.0000 mg | ORAL_TABLET | Freq: Every day | ORAL | 3 refills | Status: DC

## 2018-07-25 MED ORDER — LIRAGLUTIDE -WEIGHT MANAGEMENT 18 MG/3ML ~~LOC~~ SOPN: 0.6000 mg | PEN_INJECTOR | Freq: Every day | SUBCUTANEOUS | 11 refills | Status: DC

## 2018-07-25 NOTE — Assessment & Plan Note (Signed)
Improved with restarting paxil

## 2018-07-25 NOTE — Patient Instructions (Signed)
F/U 6 months  We will call with results  

## 2018-07-25 NOTE — Progress Notes (Signed)
   Subjective:    [name], female    DOB: Sep 01, 1960, 58 y.o.   MRN: [id number]  Patient presents for Gynecologic Exam (is fasting)  Pt here for CPE  Continues to work on dietary changes and weight loss. Weight up 3lbs. Planning to start walking again with weather changes before she goes to work. Bernie Covey doing well no side effects  Started back on Paxil 10mg  at bedtime, mood improved, hot flashes as improved, continues on estroven    Continues on Vitamin D    Allergies- using flonase and xyzal  Vaccines- UTD,including shingrix    Fasting today   Dr. Esau Grew- Dentist  [facility]  Noticed some discharge on and off, but no itching, pain in vaginal region, no bleeding   Review Of Systems:  GEN- denies fatigue, fever, weight loss,weakness, recent illness HEENT- denies eye drainage, change in vision, nasal discharge, CVS- denies chest pain, palpitations RESP- denies SOB, cough, wheeze ABD- denies N/V, change in stools, abd pain GU- denies dysuria, hematuria, dribbling, incontinence MSK- denies joint pain, muscle aches, injury Neuro- denies headache, dizziness, syncope, seizure activity       Objective:    BP 102/60   Pulse 66   Temp 98.1 F (36.7 C) (Oral)   Resp 12   Ht 5\' 4"  (1.626 m)   Wt 194 lb (88 kg)   SpO2 97%   BMI 33.30 kg/m  GEN- NAD, alert and oriented x3 HEENT- PERRL, EOMI, non injected sclera, pink conjunctiva, MMM, oropharynx clear Neck- Supple, no thyromegaly CVS- RRR, no murmur RESP-CTAB Breast- normal symmetry, no nipple inversion,no nipple drainage, no nodules or lumps felt Nodes- no axillary nodes ABD-NABS,soft,NT,ND GU- normal external genitalia, vaginal mucosa pink and moist, cervix visualized no growth, no blood form os, mild white discharge, no CMT, no ovarian masses, uterus normal size Psych- normal affect and mood  EXT- No edema Pulses- Radial, DP- 2+        Assessment & Plan:      Problem List Items  Addressed This Visit      Unprioritized   GAD (generalized anxiety disorder)    Improved with restarting paxil      Relevant Medications   PARoxetine (PAXIL) 10 MG tablet   Glucose intolerance (impaired glucose tolerance) - Primary   Relevant Orders   Hemoglobin A1c   Hyperlipidemia    Recheck lipids and A1C Continues to work on dietary changes Starting exercise program back  Continue saxenda, still maintaining 20lb weight loss      Relevant Orders   Lipid panel   Obesity   Relevant Medications   Liraglutide -Weight Management (SAXENDA) 18 MG/3ML SOPN    Other Visit Diagnoses    Routine general medical examination at a health care facility       CPE done, fasting labs to be done, prevention UTD, PAP Done today.   Relevant Orders   CBC with Differential/Platelet   Comprehensive metabolic panel   Lipid panel   Cervical cancer screening       Relevant Orders   Pap IG w/ reflex to HPV when ASC-U   Vaginal discharge       physiologic discharge, no actual symptoms    Relevant Orders   WET PREP FOR TRICH, YEAST, CLUE      Note: This dictation was prepared with Dragon dictation along with smaller phrase technology. Any transcriptional errors that result from this process are unintentional.

## 2018-07-25 NOTE — Assessment & Plan Note (Signed)
Recheck lipids and A1C Continues to work on dietary changes Starting exercise program back  Continue saxenda, still maintaining 20lb weight loss

## 2018-07-26 LAB — COMPREHENSIVE METABOLIC PANEL
AG Ratio: 1.3 (calc) (ref 1.0–2.5)
ALT: 22 U/L (ref 6–29)
AST: 19 U/L (ref 10–35)
Albumin: 4 g/dL (ref 3.6–5.1)
Alkaline phosphatase (APISO): 91 U/L (ref 37–153)
BUN: 20 mg/dL (ref 7–25)
CO2: 32 mmol/L (ref 20–32)
Calcium: 9.1 mg/dL (ref 8.6–10.4)
Chloride: 104 mmol/L (ref 98–110)
Creat: 0.68 mg/dL (ref 0.50–1.05)
Globulin: 3.1 g/dL (calc) (ref 1.9–3.7)
Glucose, Bld: 94 mg/dL (ref 65–99)
Potassium: 4.6 mmol/L (ref 3.5–5.3)
Sodium: 141 mmol/L (ref 135–146)
Total Bilirubin: 0.6 mg/dL (ref 0.2–1.2)
Total Protein: 7.1 g/dL (ref 6.1–8.1)

## 2018-07-26 LAB — CBC WITH DIFFERENTIAL/PLATELET
Absolute Monocytes: 490 cells/uL (ref 200–950)
Basophils Absolute: 43 cells/uL (ref 0–200)
Basophils Relative: 0.6 %
Eosinophils Absolute: 178 cells/uL (ref 15–500)
Eosinophils Relative: 2.5 %
HEMATOCRIT: 40.3 % (ref 35.0–45.0)
Hemoglobin: 13.3 g/dL (ref 11.7–15.5)
LYMPHS ABS: 2151 {cells}/uL (ref 850–3900)
MCH: 30.2 pg (ref 27.0–33.0)
MCHC: 33 g/dL (ref 32.0–36.0)
MCV: 91.6 fL (ref 80.0–100.0)
MPV: 10.3 fL (ref 7.5–12.5)
Monocytes Relative: 6.9 %
NEUTROS ABS: 4239 {cells}/uL (ref 1500–7800)
Neutrophils Relative %: 59.7 %
Platelets: 233 10*3/uL (ref 140–400)
RBC: 4.4 10*6/uL (ref 3.80–5.10)
RDW: 12.4 % (ref 11.0–15.0)
Total Lymphocyte: 30.3 %
WBC: 7.1 10*3/uL (ref 3.8–10.8)

## 2018-07-26 LAB — PAP IG W/ RFLX HPV ASCU

## 2018-07-26 LAB — LIPID PANEL
Cholesterol: 204 mg/dL — ABNORMAL HIGH (ref <200)
HDL: 39 mg/dL — ABNORMAL LOW (ref >=50)
LDL Cholesterol (Calc): 142 mg/dL (calc) — ABNORMAL HIGH
Non-HDL Cholesterol (Calc): 165 mg/dL (calc) — ABNORMAL HIGH (ref <130)
Total CHOL/HDL Ratio: 5.2 (calc) — ABNORMAL HIGH (ref <5.0)
Triglycerides: 109 mg/dL (ref <150)

## 2018-07-26 LAB — HEMOGLOBIN A1C
Hgb A1c MFr Bld: 5.8 % of total Hgb — ABNORMAL HIGH (ref <5.7)
Mean Plasma Glucose: 120 (calc)
eAG (mmol/L): 6.6 (calc)

## 2018-07-31 ENCOUNTER — Encounter: Payer: Self-pay | Admitting: *Deleted

## 2018-08-15 MED FILL — SAXENDA 18 MG/3 ML PEN: 18 | 30 days supply | Qty: 15 | Fill #0

## 2018-10-22 MED FILL — PARoxetine HCL 10 MG TABS: 10 | 90 days supply | Qty: 90 | Fill #0

## 2018-11-15 MED FILL — SAXENDA 18 MG/3 ML PEN: 18 | 30 days supply | Qty: 15 | Fill #1

## 2018-12-28 DIAGNOSIS — H5212 Myopia, left eye: Secondary | ICD-10-CM | POA: Diagnosis not present

## 2018-12-28 DIAGNOSIS — H52221 Regular astigmatism, right eye: Secondary | ICD-10-CM | POA: Diagnosis not present

## 2018-12-28 DIAGNOSIS — H524 Presbyopia: Secondary | ICD-10-CM | POA: Diagnosis not present

## 2018-12-31 ENCOUNTER — Other Ambulatory Visit: Payer: Self-pay | Admitting: Family Medicine

## 2018-12-31 MED FILL — SAXENDA 18 MG/3 ML PEN: 18 | 30 days supply | Qty: 15 | Fill #0

## 2019-01-25 ENCOUNTER — Ambulatory Visit: Payer: 59 | Admitting: Family Medicine

## 2019-01-25 MED FILL — PARoxetine HCL 10 MG TABS: 10 | 90 days supply | Qty: 90 | Fill #1

## 2019-02-01 ENCOUNTER — Other Ambulatory Visit: Payer: Self-pay

## 2019-02-01 ENCOUNTER — Ambulatory Visit (INDEPENDENT_AMBULATORY_CARE_PROVIDER_SITE_OTHER): Payer: 59 | Admitting: Family Medicine

## 2019-02-01 ENCOUNTER — Encounter: Payer: Self-pay | Admitting: Family Medicine

## 2019-02-01 VITALS — BP 110/64 | HR 80 | Temp 98.3°F | Resp 12 | Ht 64.0 in | Wt 204.0 lb

## 2019-02-01 DIAGNOSIS — R7302 Impaired glucose tolerance (oral): Secondary | ICD-10-CM | POA: Diagnosis not present

## 2019-02-01 DIAGNOSIS — Z6832 Body mass index (BMI) 32.0-32.9, adult: Secondary | ICD-10-CM | POA: Diagnosis not present

## 2019-02-01 DIAGNOSIS — D509 Iron deficiency anemia, unspecified: Secondary | ICD-10-CM

## 2019-02-01 DIAGNOSIS — E6609 Other obesity due to excess calories: Secondary | ICD-10-CM | POA: Diagnosis not present

## 2019-02-01 DIAGNOSIS — E782 Mixed hyperlipidemia: Secondary | ICD-10-CM

## 2019-02-01 DIAGNOSIS — F411 Generalized anxiety disorder: Secondary | ICD-10-CM | POA: Diagnosis not present

## 2019-02-01 MED ORDER — PAROXETINE HCL 10 MG PO TABS: 10.0000 mg | ORAL_TABLET | Freq: Every day | ORAL | 3 refills | Status: DC

## 2019-02-01 MED ORDER — SAXENDA 18 MG/3ML ~~LOC~~ SOPN: 3.0000 mg | PEN_INJECTOR | Freq: Every day | SUBCUTANEOUS | 6 refills | Status: DC

## 2019-02-01 MED ORDER — ATORVASTATIN CALCIUM 20 MG PO TABS: 20.0000 mg | ORAL_TABLET | Freq: Every day | ORAL | 3 refills | Status: DC

## 2019-02-01 NOTE — Patient Instructions (Addendum)
F/U 6 months physical

## 2019-02-01 NOTE — Assessment & Plan Note (Signed)
Continue paxil, she is planning to get back into the gym which was also a great stress relief for her

## 2019-02-01 NOTE — Progress Notes (Signed)
   Subjective:    [name], female    DOB: 09/11/1960, 58 y.o.   MRN: [id number]  Patient presents for Follow-up (is fasting)   Pt here to f/u chronic medical problems, medications reviewed    DM- taking Saxenda, last A1C 5.8% in March  she has not been able to work out with trainor in setting of COVID-19 restrictions  Hyperlipidemia- diet controlled , seen by ophthalmologist [facility], told she has cholesterol build up in her eyes   Obesity- weight 10lbs since March/ she has noticed more joint pain  GAD- taking paxil 10mg  once a day, it was restarted in March this has helped in setting of stressors with work and covid  Flu shot at work  [place]:  GEN- denies fatigue, fever, weight loss,weakness, recent illness HEENT- denies eye drainage, change in vision, nasal discharge, CVS- denies chest pain, palpitations RESP- denies SOB, cough, wheeze ABD- denies N/V, change in stools, abd pain GU- denies dysuria, hematuria, dribbling, incontinence MSK- + joint pain, muscle aches, injury Neuro- denies headache, dizziness, syncope, seizure activity       Objective:    BP 110/64   Pulse 80   Temp 98.3 F (36.8 C) (Oral)   Resp 12   Ht 5\' 4"  (1.626 m)   Wt 204 lb (92.5 kg)   SpO2 95%   BMI 35.02 kg/m  GEN- NAD, alert and oriented x3, obese HEENT- PERRL, EOMI, non injected sclera, pink conjunctiva, MMM, oropharynx clear Neck- Supple, no thyromegaly CVS- RRR, no murmur RESP-CTAB Psych- normal affect and mood  EXT- No edema Pulses- Radial, DP- 2+        Assessment & Plan:      Problem List Items Addressed This Visit      Unprioritized   Anemia, iron deficiency   Relevant Orders   CBC with Differential/Platelet (Completed)   GAD (generalized anxiety disorder)    Continue paxil, she is planning to get back into the gym which was also a great stress relief for her       Relevant Medications   PARoxetine (PAXIL) 10 MG tablet   Glucose  intolerance (impaired glucose tolerance)    Recheck A1C      Relevant Orders   CBC with Differential/Platelet (Completed)   Hemoglobin A1c (Completed)   Hyperlipidemia - Primary    Start statin lipitor 20mg  at bedgtime, obtain eye visit note as well       Relevant Medications   atorvastatin (LIPITOR) 20 MG tablet   Other Relevant Orders   Comprehensive metabolic panel (Completed)   Lipid panel (Completed)   Obesity    Discussed diet, previous exercise regimen which will also help with stress reduction      Relevant Medications   Liraglutide -Weight Management (SAXENDA) 18 MG/3ML SOPN      Note: This dictation was prepared with Dragon dictation along with smaller phrase technology. Any transcriptional errors that result from this process are unintentional.

## 2019-02-02 LAB — LIPID PANEL
Cholesterol: 223 mg/dL — ABNORMAL HIGH (ref <200)
HDL: 39 mg/dL — ABNORMAL LOW (ref >=50)
LDL Cholesterol (Calc): 160 mg/dL (calc) — ABNORMAL HIGH
Non-HDL Cholesterol (Calc): 184 mg/dL (calc) — ABNORMAL HIGH (ref <130)
Total CHOL/HDL Ratio: 5.7 (calc) — ABNORMAL HIGH (ref <5.0)
Triglycerides: 118 mg/dL (ref <150)

## 2019-02-02 LAB — COMPREHENSIVE METABOLIC PANEL
AG Ratio: 1.3 (calc) (ref 1.0–2.5)
ALT: 17 U/L (ref 6–29)
AST: 15 U/L (ref 10–35)
Albumin: 4.1 g/dL (ref 3.6–5.1)
Alkaline phosphatase (APISO): 100 U/L (ref 37–153)
BUN: 21 mg/dL (ref 7–25)
CO2: 27 mmol/L (ref 20–32)
Calcium: 9.4 mg/dL (ref 8.6–10.4)
Chloride: 104 mmol/L (ref 98–110)
Creat: 0.76 mg/dL (ref 0.50–1.05)
Globulin: 3.2 g/dL (calc) (ref 1.9–3.7)
Glucose, Bld: 97 mg/dL (ref 65–99)
Potassium: 4.5 mmol/L (ref 3.5–5.3)
Sodium: 141 mmol/L (ref 135–146)
Total Bilirubin: 0.7 mg/dL (ref 0.2–1.2)
Total Protein: 7.3 g/dL (ref 6.1–8.1)

## 2019-02-02 LAB — CBC WITH DIFFERENTIAL/PLATELET
Absolute Monocytes: 511 cells/uL (ref 200–950)
Basophils Absolute: 30 cells/uL (ref 0–200)
Basophils Relative: 0.4 %
Eosinophils Absolute: 170 cells/uL (ref 15–500)
Eosinophils Relative: 2.3 %
HCT: 40.5 % (ref 35.0–45.0)
Hemoglobin: 13.3 g/dL (ref 11.7–15.5)
Lymphs Abs: 2279 cells/uL (ref 850–3900)
MCH: 29.6 pg (ref 27.0–33.0)
MCHC: 32.8 g/dL (ref 32.0–36.0)
MCV: 90 fL (ref 80.0–100.0)
MPV: 10.5 fL (ref 7.5–12.5)
Monocytes Relative: 6.9 %
Neutro Abs: 4410 cells/uL (ref 1500–7800)
Neutrophils Relative %: 59.6 %
Platelets: 239 10*3/uL (ref 140–400)
RBC: 4.5 10*6/uL (ref 3.80–5.10)
RDW: 12.4 % (ref 11.0–15.0)
Total Lymphocyte: 30.8 %
WBC: 7.4 10*3/uL (ref 3.8–10.8)

## 2019-02-02 LAB — HEMOGLOBIN A1C
Hgb A1c MFr Bld: 5.9 % of total Hgb — ABNORMAL HIGH (ref <5.7)
Mean Plasma Glucose: 123 (calc)
eAG (mmol/L): 6.8 (calc)

## 2019-02-03 ENCOUNTER — Encounter: Payer: Self-pay | Admitting: Family Medicine

## 2019-02-03 NOTE — Assessment & Plan Note (Signed)
Start statin lipitor 20mg  at bedgtime, obtain eye visit note as well

## 2019-02-03 NOTE — Assessment & Plan Note (Signed)
Discussed diet, previous exercise regimen which will also help with stress reduction

## 2019-02-03 NOTE — Assessment & Plan Note (Signed)
Recheck A1C 

## 2019-02-28 MED FILL — SAXENDA 18 MG/3 ML PEN: 18 | 30 days supply | Qty: 15 | Fill #0

## 2019-05-07 ENCOUNTER — Telehealth: Payer: Self-pay | Admitting: *Deleted

## 2019-05-07 NOTE — Telephone Encounter (Signed)
Received request from pharmacy for [place] on Saxenda.  PA submitted.   Dx: E66.09- obeisty.   Weight 01/16/17- 213lb/ 36.54 BMI Weight 9/111/20- 204lb/ 35 BMI Weight loss- 4.23%  Saxenda is being used as an adjunct to lifestyle modification (e.g., dietary or caloric restriction, exercise, behavioral support, community based program). The patient also has an additional disorder related to weight (HLD/GAD). The patient has been on this medication for >16 weeks and medication noted to be effective. Patient is unable to use Qsymia or Contrave due to concurrent treatment of GAD.

## 2019-05-07 NOTE — Telephone Encounter (Signed)
[employer] is reviewing your PA request. You may close this dialog, return to your dashboard, and perform other tasks.  To check for an update later, open this request again from your dashboard. If [employer] has not replied within 24 hours for urgent requests or within 48 hours for standard requests, please contact [employer] at [phone]. 

## 2019-05-08 MED ORDER — SAXENDA 18 MG/3ML ~~LOC~~ SOPN: 3.0000 mg | PEN_INJECTOR | Freq: Every day | SUBCUTANEOUS | 6 refills | Status: DC

## 2019-05-08 NOTE — Telephone Encounter (Signed)
Received PA determination.   PA [id number] approved 05/08/2019- 05/06/2020.  Pharmacy made aware.

## 2019-08-05 ENCOUNTER — Ambulatory Visit (INDEPENDENT_AMBULATORY_CARE_PROVIDER_SITE_OTHER): Payer: 59 | Admitting: Family Medicine

## 2019-08-05 ENCOUNTER — Other Ambulatory Visit: Payer: Self-pay

## 2019-08-05 ENCOUNTER — Encounter: Payer: Self-pay | Admitting: Family Medicine

## 2019-08-05 VITALS — BP 128/72 | HR 98 | Temp 98.2°F | Resp 16 | Ht 64.0 in | Wt 213.0 lb

## 2019-08-05 DIAGNOSIS — Z0000 Encounter for general adult medical examination without abnormal findings: Secondary | ICD-10-CM | POA: Diagnosis not present

## 2019-08-05 DIAGNOSIS — F3281 Premenstrual dysphoric disorder: Secondary | ICD-10-CM

## 2019-08-05 DIAGNOSIS — E559 Vitamin D deficiency, unspecified: Secondary | ICD-10-CM | POA: Diagnosis not present

## 2019-08-05 DIAGNOSIS — E782 Mixed hyperlipidemia: Secondary | ICD-10-CM | POA: Diagnosis not present

## 2019-08-05 DIAGNOSIS — F411 Generalized anxiety disorder: Secondary | ICD-10-CM | POA: Diagnosis not present

## 2019-08-05 DIAGNOSIS — Z1231 Encounter for screening mammogram for malignant neoplasm of breast: Secondary | ICD-10-CM

## 2019-08-05 DIAGNOSIS — R7302 Impaired glucose tolerance (oral): Secondary | ICD-10-CM | POA: Diagnosis not present

## 2019-08-05 MED ORDER — SAXENDA 18 MG/3ML ~~LOC~~ SOPN: 3.0000 mg | PEN_INJECTOR | Freq: Every day | SUBCUTANEOUS | 6 refills | Status: DC

## 2019-08-05 MED ORDER — PAROXETINE HCL 10 MG PO TABS: 10.0000 mg | ORAL_TABLET | Freq: Every day | ORAL | 3 refills | Status: DC

## 2019-08-05 MED ORDER — ATORVASTATIN CALCIUM 20 MG PO TABS: 20.0000 mg | ORAL_TABLET | Freq: Every day | ORAL | 3 refills | Status: DC

## 2019-08-05 NOTE — Progress Notes (Signed)
   Subjective:    [name], female    DOB: [birth year], 59 y.o.   MRN: [id number]  Patient presents for Annual Exam (is fasting)  Pt here for CPE     Immunizations- UTD, including COVID 19   Mammogram Due had Oct 2019  [place]  PAP Smear UTD March 2020  She recently started back at the GYM 2 weeks ago   Dentist- Dr. Esau Grew  Every 6 months   Vitamin D takes up to 6000IU a day    PMDD/ GAD- taking paxil, has had some stressors when her daughter was sick last year but she has been improving.  Her daughter has a muscular dystrophy requires ventilation sometimes throughout the day as well as at night.  She does feel like getting back into the gym recently his already improved her mood.  No her hot flashes are controlled with the Paxil  Review Of Systems:  GEN- denies fatigue, fever, weight loss,weakness, recent illness HEENT- denies eye drainage, change in vision, nasal discharge, CVS- denies chest pain, palpitations RESP- denies SOB, cough, wheeze ABD- denies N/V, change in stools, abd pain GU- denies dysuria, hematuria, dribbling, incontinence MSK- denies joint pain, muscle aches, injury Neuro- denies headache, dizziness, syncope, seizure activity       Objective:    BP 128/72   Pulse 98   Temp 98.2 F (36.8 C) (Temporal)   Resp 16   Ht 5\' 4"  (1.626 m)   Wt 213 lb (96.6 kg)   SpO2 96%   BMI 36.56 kg/m  GEN- NAD, alert and oriented x3 HEENT- PERRL, EOMI, non injected sclera, pink conjunctiva, TM clear bilaterally no effusion Neck- Supple, no thyromegaly CVS- RRR, no murmur RESP-CTAB ABD-NABS,soft,NT,ND Psych normal affect and mood EXT- No edema Pulses- Radial, DP- 2+  CAGE/ DEPRESSION/ FALL screening negative       Assessment & Plan:      Problem List Items Addressed This Visit      Unprioritized   GAD (generalized anxiety disorder)   Relevant Medications   PARoxetine (PAXIL) 10 MG tablet   Glucose intolerance (impaired glucose  tolerance)    Recheck A1C Pt working on dietary adjustment and recently restarted exercise program Continue Saxenda       Relevant Orders   Hemoglobin A1c   Hyperlipidemia   Relevant Medications   atorvastatin (LIPITOR) 20 MG tablet   Other Relevant Orders   Lipid panel   PMDD (premenstrual dysphoric disorder)    Continue paxil      Relevant Medications   PARoxetine (PAXIL) 10 MG tablet   Vitamin D deficiency   Relevant Orders   Vitamin D, 25-hydroxy    Other Visit Diagnoses    Routine general medical examination at a health care facility    -  Primary   CPE done, fasting labs obtained, pt to schedule mammo   Relevant Orders   CBC with Differential/Platelet   Comprehensive metabolic panel   Encounter for screening mammogram for malignant neoplasm of breast       Relevant Orders   MM 3D SCREEN BREAST BILATERAL      Note: This dictation was prepared with Dragon dictation along with smaller phrase technology. Any transcriptional errors that result from this process are unintentional.

## 2019-08-05 NOTE — Assessment & Plan Note (Signed)
Continue paxil °

## 2019-08-05 NOTE — Assessment & Plan Note (Signed)
Recheck A1C Pt working on dietary adjustment and recently restarted exercise program Continue Saxenda

## 2019-08-06 LAB — COMPREHENSIVE METABOLIC PANEL
AG Ratio: 1.3 (calc) (ref 1.0–2.5)
ALT: 25 U/L (ref 6–29)
AST: 19 U/L (ref 10–35)
Albumin: 4.1 g/dL (ref 3.6–5.1)
Alkaline phosphatase (APISO): 103 U/L (ref 37–153)
BUN: 14 mg/dL (ref 7–25)
CO2: 31 mmol/L (ref 20–32)
Calcium: 9.4 mg/dL (ref 8.6–10.4)
Chloride: 104 mmol/L (ref 98–110)
Creat: 0.72 mg/dL (ref 0.50–1.05)
Globulin: 3.2 g/dL (calc) (ref 1.9–3.7)
Glucose, Bld: 96 mg/dL (ref 65–99)
Potassium: 3.9 mmol/L (ref 3.5–5.3)
Sodium: 142 mmol/L (ref 135–146)
Total Bilirubin: 0.8 mg/dL (ref 0.2–1.2)
Total Protein: 7.3 g/dL (ref 6.1–8.1)

## 2019-08-06 LAB — LIPID PANEL
Cholesterol: 145 mg/dL (ref <200)
HDL: 36 mg/dL — ABNORMAL LOW (ref >=50)
LDL Cholesterol (Calc): 90 mg/dL (calc)
Non-HDL Cholesterol (Calc): 109 mg/dL (calc) (ref <130)
Total CHOL/HDL Ratio: 4 (calc) (ref <5.0)
Triglycerides: 98 mg/dL (ref <150)

## 2019-08-06 LAB — CBC WITH DIFFERENTIAL/PLATELET
Absolute Monocytes: 497 cells/uL (ref 200–950)
Basophils Absolute: 21 cells/uL (ref 0–200)
Basophils Relative: 0.3 %
Eosinophils Absolute: 249 cells/uL (ref 15–500)
Eosinophils Relative: 3.5 %
HCT: 41.7 % (ref 35.0–45.0)
Hemoglobin: 13.3 g/dL (ref 11.7–15.5)
Lymphs Abs: 2158 cells/uL (ref 850–3900)
MCH: 29.6 pg (ref 27.0–33.0)
MCHC: 31.9 g/dL — ABNORMAL LOW (ref 32.0–36.0)
MCV: 92.7 fL (ref 80.0–100.0)
MPV: 10.5 fL (ref 7.5–12.5)
Monocytes Relative: 7 %
Neutro Abs: 4175 cells/uL (ref 1500–7800)
Neutrophils Relative %: 58.8 %
Platelets: 225 10*3/uL (ref 140–400)
RBC: 4.5 10*6/uL (ref 3.80–5.10)
RDW: 12.5 % (ref 11.0–15.0)
Total Lymphocyte: 30.4 %
WBC: 7.1 10*3/uL (ref 3.8–10.8)

## 2019-08-06 LAB — VITAMIN D 25 HYDROXY (VIT D DEFICIENCY, FRACTURES): Vit D, 25-Hydroxy: 58 ng/mL (ref 30–100)

## 2019-08-06 LAB — HEMOGLOBIN A1C
Hgb A1c MFr Bld: 6.1 % of total Hgb — ABNORMAL HIGH (ref <5.7)
Mean Plasma Glucose: 128 (calc)
eAG (mmol/L): 7.1 (calc)

## 2019-12-06 ENCOUNTER — Ambulatory Visit: Payer: 59 | Admitting: Family Medicine

## 2019-12-13 ENCOUNTER — Encounter: Payer: Self-pay | Admitting: Family Medicine

## 2019-12-13 DIAGNOSIS — Z1231 Encounter for screening mammogram for malignant neoplasm of breast: Secondary | ICD-10-CM | POA: Diagnosis not present

## 2019-12-16 ENCOUNTER — Other Ambulatory Visit: Payer: Self-pay

## 2019-12-16 ENCOUNTER — Encounter: Payer: Self-pay | Admitting: Family Medicine

## 2019-12-16 ENCOUNTER — Ambulatory Visit: Payer: 59 | Admitting: Family Medicine

## 2019-12-16 ENCOUNTER — Other Ambulatory Visit: Payer: Self-pay | Admitting: Family Medicine

## 2019-12-16 VITALS — BP 128/66 | HR 72 | Temp 98.9°F | Resp 14 | Ht 64.0 in | Wt 212.0 lb

## 2019-12-16 DIAGNOSIS — R7302 Impaired glucose tolerance (oral): Secondary | ICD-10-CM | POA: Diagnosis not present

## 2019-12-16 DIAGNOSIS — E669 Obesity, unspecified: Secondary | ICD-10-CM

## 2019-12-16 DIAGNOSIS — E782 Mixed hyperlipidemia: Secondary | ICD-10-CM

## 2019-12-16 MED ORDER — OZEMPIC (0.25 OR 0.5 MG/DOSE) 2 MG/1.5ML ~~LOC~~ SOPN: 0.5000 mg | PEN_INJECTOR | SUBCUTANEOUS | 1 refills | Status: DC

## 2019-12-16 NOTE — Progress Notes (Signed)
   Subjective:    [name], female    DOB: [birth year], 59 y.o.   MRN: [id number]  Patient presents for Follow-up (is fasting)  Patient here to follow-up chronic medical problems.  Medications reviewed  Obesity-  She is trying to increase physical activity, currently on Saxenda , some days, doesn't feel like it is working, she cant tell a difference in her appetite if she misses a dose  Borderline DM, last A1C in March, increased to  6.1%   Lipids had improved significantly, LDL was down to  90, taking lipitor 20mg  once a day     Due for eye exam   Review Of Systems:  GEN- denies fatigue, fever, weight loss,weakness, recent illness HEENT- denies eye drainage, change in vision, nasal discharge, CVS- denies chest pain, palpitations RESP- denies SOB, cough, wheeze ABD- denies N/V, change in stools, abd pain GU- denies dysuria, hematuria, dribbling, incontinence MSK- denies joint pain, muscle aches, injury Neuro- denies headache, dizziness, syncope, seizure activity       Objective:    BP 128/66   Pulse 72   Temp 98.9 F (37.2 C) (Temporal)   Resp 14   Ht 5\' 4"  (1.626 m)   Wt (!) 212 lb (96.2 kg)   SpO2 99%   BMI 36.39 kg/m  GEN- NAD, alert and oriented x3 HEENT- PERRL, EOMI, non injected sclera, pink conjunctiva, MMM, oropharynx clear Neck- Supple, no thyromegaly CVS- RRR, no murmur RESP-CTAB ABD-NABS,soft,NT,ND EXT- No edema Pulses- Radial, DP- 2+        Assessment & Plan:      Problem List Items Addressed This Visit      Unprioritized   Class 2 obesity - Primary   Glucose intolerance (impaired glucose tolerance)    Glucose intolerance in the setting of hyperlipidemia and class II obesity.  We will recheck A1c today along with her fasting labs.  Previous A1c was increasing.    I will switch her to Ozempic 0.5 mg once a week to see if we can improve both glucose control as well as her weight.  She will finish up the last couple weeks of  Saxenda that she has left.    She will continue with trying to increase her physical activity starting with walking since she is not been able to get back to her routine exercise classes.  Continue with lower carb diet, increasing water      Relevant Orders   CBC with Differential/Platelet   Hemoglobin A1c   Hyperlipidemia    Continue Lipitor tolerating without any side effects.      Relevant Orders   CBC with Differential/Platelet   Comprehensive metabolic panel   Lipid panel      Note: This dictation was prepared with Dragon dictation along with smaller phrase technology. Any transcriptional errors that result from this process are unintentional.

## 2019-12-16 NOTE — Assessment & Plan Note (Addendum)
Glucose intolerance in the setting of hyperlipidemia and class II obesity.  We will recheck A1c today along with her fasting labs.  Previous A1c was increasing.    I will switch her to Ozempic 0.5 mg once a week to see if we can improve both glucose control as well as her weight.  She will finish up the last couple weeks of Saxenda that she has left.    She will continue with trying to increase her physical activity starting with walking since she is not been able to get back to her routine exercise classes.  Continue with lower carb diet, increasing water

## 2019-12-16 NOTE — Assessment & Plan Note (Signed)
Continue Lipitor tolerating without any side effects.

## 2019-12-16 NOTE — Patient Instructions (Addendum)
Finish up the [place], then start Ozempic 0.5mg  once a week  F/U  4 months

## 2019-12-17 LAB — COMPREHENSIVE METABOLIC PANEL
AG Ratio: 1.2 (calc) (ref 1.0–2.5)
ALT: 30 U/L — ABNORMAL HIGH (ref 6–29)
AST: 23 U/L (ref 10–35)
Albumin: 4.1 g/dL (ref 3.6–5.1)
Alkaline phosphatase (APISO): 113 U/L (ref 37–153)
BUN: 15 mg/dL (ref 7–25)
CO2: 28 mmol/L (ref 20–32)
Calcium: 9.1 mg/dL (ref 8.6–10.4)
Chloride: 102 mmol/L (ref 98–110)
Creat: 0.77 mg/dL (ref 0.50–1.05)
Globulin: 3.3 g/dL (calc) (ref 1.9–3.7)
Glucose, Bld: 101 mg/dL — ABNORMAL HIGH (ref 65–99)
Potassium: 4.1 mmol/L (ref 3.5–5.3)
Sodium: 138 mmol/L (ref 135–146)
Total Bilirubin: 0.7 mg/dL (ref 0.2–1.2)
Total Protein: 7.4 g/dL (ref 6.1–8.1)

## 2019-12-17 LAB — LIPID PANEL
Cholesterol: 151 mg/dL (ref <200)
HDL: 38 mg/dL — ABNORMAL LOW (ref >=50)
LDL Cholesterol (Calc): 91 mg/dL (calc)
Non-HDL Cholesterol (Calc): 113 mg/dL (calc) (ref <130)
Total CHOL/HDL Ratio: 4 (calc) (ref <5.0)
Triglycerides: 120 mg/dL (ref <150)

## 2019-12-17 LAB — CBC WITH DIFFERENTIAL/PLATELET
Absolute Monocytes: 612 cells/uL (ref 200–950)
Basophils Absolute: 34 cells/uL (ref 0–200)
Basophils Relative: 0.4 %
Eosinophils Absolute: 179 cells/uL (ref 15–500)
Eosinophils Relative: 2.1 %
HCT: 41.3 % (ref 35.0–45.0)
Hemoglobin: 13.3 g/dL (ref 11.7–15.5)
Lymphs Abs: 2618 cells/uL (ref 850–3900)
MCH: 29.5 pg (ref 27.0–33.0)
MCHC: 32.2 g/dL (ref 32.0–36.0)
MCV: 91.6 fL (ref 80.0–100.0)
MPV: 10.7 fL (ref 7.5–12.5)
Monocytes Relative: 7.2 %
Neutro Abs: 5058 cells/uL (ref 1500–7800)
Neutrophils Relative %: 59.5 %
Platelets: 248 10*3/uL (ref 140–400)
RBC: 4.51 10*6/uL (ref 3.80–5.10)
RDW: 12.4 % (ref 11.0–15.0)
Total Lymphocyte: 30.8 %
WBC: 8.5 10*3/uL (ref 3.8–10.8)

## 2019-12-17 LAB — HEMOGLOBIN A1C
Hgb A1c MFr Bld: 6.2 % of total Hgb — ABNORMAL HIGH (ref <5.7)
Mean Plasma Glucose: 131 (calc)
eAG (mmol/L): 7.3 (calc)

## 2020-01-28 DIAGNOSIS — H43393 Other vitreous opacities, bilateral: Secondary | ICD-10-CM | POA: Diagnosis not present

## 2020-01-28 DIAGNOSIS — H5213 Myopia, bilateral: Secondary | ICD-10-CM | POA: Diagnosis not present

## 2020-01-28 DIAGNOSIS — H52223 Regular astigmatism, bilateral: Secondary | ICD-10-CM | POA: Diagnosis not present

## 2020-01-28 DIAGNOSIS — E119 Type 2 diabetes mellitus without complications: Secondary | ICD-10-CM | POA: Diagnosis not present

## 2020-01-28 DIAGNOSIS — H524 Presbyopia: Secondary | ICD-10-CM | POA: Diagnosis not present

## 2020-02-25 ENCOUNTER — Ambulatory Visit: Payer: 59 | Attending: Internal Medicine

## 2020-02-25 DIAGNOSIS — Z23 Encounter for immunization: Secondary | ICD-10-CM

## 2020-02-25 NOTE — Progress Notes (Addendum)
   Cov[facility] Vaccination [facility]  Name:  SUSSAN METER    MRN: [id number] DOB: 11-06-1960  02/25/2020  Ms. Passmore was observed post Covid-19 immunization for 30 minutes without incident. She was provided with Vaccine Information Sheet and instruction to access the V-Safe system.   Ms. Labuda was instructed to call 911 with any severe reactions post vaccine: Marland Kitchen Difficulty breathing  . Swelling of face and throat  . A fast heartbeat  . A bad rash all over body  . Dizziness and weakness

## 2020-03-30 ENCOUNTER — Other Ambulatory Visit: Payer: Self-pay

## 2020-03-30 ENCOUNTER — Ambulatory Visit: Payer: 59 | Admitting: Family Medicine

## 2020-03-30 ENCOUNTER — Encounter: Payer: Self-pay | Admitting: Family Medicine

## 2020-03-30 VITALS — BP 118/64 | HR 72 | Temp 98.2°F | Resp 14 | Ht 64.0 in | Wt 206.0 lb

## 2020-03-30 DIAGNOSIS — R7302 Impaired glucose tolerance (oral): Secondary | ICD-10-CM | POA: Diagnosis not present

## 2020-03-30 DIAGNOSIS — E669 Obesity, unspecified: Secondary | ICD-10-CM | POA: Diagnosis not present

## 2020-03-30 DIAGNOSIS — E782 Mixed hyperlipidemia: Secondary | ICD-10-CM

## 2020-03-30 NOTE — Progress Notes (Signed)
   Subjective:    [name], female    DOB: [birth year], 59 y.o.   MRN: [id number]  Patient presents for Follow-up (is fasting)  Patient here to follow-up chronic medical problems.  Medications reviewed. Class II obesity -her last visit in July she was switched to Ozempic 0.5 mg once a week.  She is doing much better with the GLP-1.  Her weight is down 6 pounds in the past 3 months.  She admits ifshe eats a lot of carbs gets nauseated, too little feels tired  she has been eating very late as well, she has now  to eat before 7pm   Eatings more veggies with lunch and dinner She had to cut out bread completely tried whole grain but still overeating the bread and didn't see weight change, she was also eating more sugar-candy that she has cut out  Regarding ozempic she has noticed by Thursday has more cravings, like the medication is not lasting          Her last A1c was 6.2% in July was a slight increase.  She is due for recheck today.  She also had mild elevation in her ALT 30 normal is 29 her blood.  Due for recheck on that today as well. Her cholesterol is at goal LDL 91 triglycerides 120  Review Of Systems:  GEN- denies fatigue, fever, weight loss,weakness, recent illness HEENT- denies eye drainage, change in vision, nasal discharge, CVS- denies chest pain, palpitations RESP- denies SOB, cough, wheeze ABD- denies N/V, change in stools, abd pain GU- denies dysuria, hematuria, dribbling, incontinence MSK- denies joint pain, muscle aches, injury Neuro- denies headache, dizziness, syncope, seizure activity       Objective:    BP 118/64   Pulse 72   Temp 98.2 F (36.8 C) (Temporal)   Resp 14   Ht 5\' 4"  (1.626 m)   Wt 206 lb (93.4 kg)   SpO2 97%   BMI 35.36 kg/m  GEN- NAD, alert and oriented x3 HEENT- PERRL, EOMI, non injected sclera, pink conjunctiva, MMM, oropharynx clear Neck- Supple, no thyromegaly CVS- RRR, no  murmur RESP-CTAB ABD-NABS,soft,NT,ND EXT- No edema Pulses- Radial, DP- 2+        Assessment & Plan:      Problem List Items Addressed This Visit      Unprioritized   Class 2 obesity   Relevant Orders   Comprehensive metabolic panel   Hemoglobin A1c   Glucose intolerance (impaired glucose tolerance) - Primary    Recheck A1C increase protein to help with hunger and cravings , at least 80grams protein daily  Will try this before increasing ozempic dose Add more veggies  She is already eating breakfast  Reduce sugar in diet/junk food, late night eating       Relevant Orders   Comprehensive metabolic panel   Hemoglobin A1c   Hyperlipidemia    Lipids at goal last check Recheck LFT         Note: This dictation was prepared with Dragon dictation along with smaller phrase technology. Any transcriptional errors that result from this process are unintentional.

## 2020-03-30 NOTE — Assessment & Plan Note (Signed)
Lipids at goal last check Recheck LFT

## 2020-03-30 NOTE — Assessment & Plan Note (Signed)
Recheck A1C increase protein to help with hunger and cravings , at least 80grams protein daily  Will try this before increasing ozempic dose Add more veggies  She is already eating breakfast  Reduce sugar in diet/junk food, late night eating

## 2020-03-30 NOTE — Patient Instructions (Addendum)
Increase protein Try to get  80grams a day  F/u 3 MONTHS

## 2020-03-31 LAB — COMPREHENSIVE METABOLIC PANEL
AG Ratio: 1.2 (calc) (ref 1.0–2.5)
ALT: 23 U/L (ref 6–29)
AST: 19 U/L (ref 10–35)
Albumin: 4.1 g/dL (ref 3.6–5.1)
Alkaline phosphatase (APISO): 107 U/L (ref 37–153)
BUN: 17 mg/dL (ref 7–25)
CO2: 29 mmol/L (ref 20–32)
Calcium: 9.3 mg/dL (ref 8.6–10.4)
Chloride: 102 mmol/L (ref 98–110)
Creat: 0.68 mg/dL (ref 0.50–1.05)
Globulin: 3.4 g/dL (calc) (ref 1.9–3.7)
Glucose, Bld: 93 mg/dL (ref 65–99)
Potassium: 4.1 mmol/L (ref 3.5–5.3)
Sodium: 140 mmol/L (ref 135–146)
Total Bilirubin: 0.7 mg/dL (ref 0.2–1.2)
Total Protein: 7.5 g/dL (ref 6.1–8.1)

## 2020-03-31 LAB — HEMOGLOBIN A1C
Hgb A1c MFr Bld: 6.1 % of total Hgb — ABNORMAL HIGH (ref <5.7)
Mean Plasma Glucose: 128 (calc)
eAG (mmol/L): 7.1 (calc)

## 2020-04-20 ENCOUNTER — Ambulatory Visit: Payer: 59 | Admitting: Family Medicine

## 2020-05-04 ENCOUNTER — Other Ambulatory Visit (HOSPITAL_COMMUNITY): Payer: Self-pay | Admitting: Family Medicine

## 2020-05-04 ENCOUNTER — Encounter: Payer: Self-pay | Admitting: Family Medicine

## 2020-05-04 MED ORDER — OZEMPIC (1 MG/DOSE) 2 MG/1.5ML ~~LOC~~ SOPN: 1.0000 mg | PEN_INJECTOR | SUBCUTANEOUS | 3 refills | Status: DC

## 2020-05-07 ENCOUNTER — Telehealth: Payer: Self-pay | Admitting: *Deleted

## 2020-05-07 NOTE — Telephone Encounter (Signed)
Received request from pharmacy for PA on Ozempic.   PA submitted.   Dx: E88.1- metabolic syndrome, R73.02- impaired glucose tolerance.  Your PA has been faxed to the plan as a paper copy. Please contact the plan directly if you haven't received a determination in a typical timeframe.  You will be notified of the determination electronically and via fax.

## 2020-05-13 ENCOUNTER — Other Ambulatory Visit (HOSPITAL_COMMUNITY): Payer: Self-pay

## 2020-05-18 NOTE — Telephone Encounter (Signed)
Received PA determination.   PA cancelled as medication is a covered benefit.

## 2020-06-30 ENCOUNTER — Ambulatory Visit: Payer: 59 | Admitting: Family Medicine

## 2020-07-13 ENCOUNTER — Encounter: Payer: Self-pay | Admitting: Family Medicine

## 2020-07-13 ENCOUNTER — Other Ambulatory Visit: Payer: Self-pay

## 2020-07-13 ENCOUNTER — Ambulatory Visit: Payer: 59 | Admitting: Family Medicine

## 2020-07-13 ENCOUNTER — Other Ambulatory Visit (HOSPITAL_COMMUNITY): Payer: Self-pay | Admitting: Family Medicine

## 2020-07-13 VITALS — BP 134/70 | HR 80 | Temp 97.9°F | Resp 14 | Ht 64.0 in | Wt 204.0 lb

## 2020-07-13 DIAGNOSIS — E782 Mixed hyperlipidemia: Secondary | ICD-10-CM | POA: Diagnosis not present

## 2020-07-13 DIAGNOSIS — F3281 Premenstrual dysphoric disorder: Secondary | ICD-10-CM | POA: Diagnosis not present

## 2020-07-13 DIAGNOSIS — E669 Obesity, unspecified: Secondary | ICD-10-CM

## 2020-07-13 DIAGNOSIS — R7302 Impaired glucose tolerance (oral): Secondary | ICD-10-CM | POA: Diagnosis not present

## 2020-07-13 MED ORDER — ATORVASTATIN CALCIUM 20 MG PO TABS: 20.0000 mg | ORAL_TABLET | Freq: Every day | ORAL | 3 refills | Status: DC

## 2020-07-13 MED ORDER — OZEMPIC (1 MG/DOSE) 2 MG/1.5ML ~~LOC~~ SOPN: 1.0000 mg | PEN_INJECTOR | SUBCUTANEOUS | 3 refills | Status: DC

## 2020-07-13 MED ORDER — PAROXETINE HCL 10 MG PO TABS: 10.0000 mg | ORAL_TABLET | Freq: Every day | ORAL | 3 refills | Status: DC

## 2020-07-13 NOTE — Progress Notes (Signed)
   Subjective:    [name], female    DOB: 1960/12/26, 60 y.o.   MRN: [id number]  Patient presents for Follow-up (Is fasting- Ozempic)   Pt here to f/u chronic medical problems  She has reduced carbs, she is now packing lunch regulary, reduced portions  appetite is good with Ozempic 1mg  once a week   Has stayed off bread   Weight down 2lbs    HLD- taking lipitor 20mg  once a day   She is not getting regular physical activity        Review Of Systems:  GEN- denies fatigue, fever, weight loss,weakness, recent illness HEENT- denies eye drainage, change in vision, nasal discharge, CVS- denies chest pain, palpitations RESP- denies SOB, cough, wheeze ABD- denies N/V, change in stools, abd pain GU- denies dysuria, hematuria, dribbling, incontinence MSK- denies joint pain, muscle aches, injury Neuro- denies headache, dizziness, syncope, seizure activity       Objective:    BP 134/70   Pulse 80   Temp 97.9 F (36.6 C) (Temporal)   Resp 14   Ht 5\' 4"  (1.626 m)   Wt 204 lb (92.5 kg)   SpO2 96%   BMI 35.02 kg/m  GEN- NAD, alert and oriented x3 HEENT- PERRL, EOMI, non injected sclera, pink conjunctiva, MMM, oropharynx clear Neck- Supple, no thyromegaly CVS- RRR, no murmur RESP-CTAB ABD-NABS,soft,NT,ND Psych normal affect and mood  EXT- No edema Pulses- Radial  2+        Assessment & Plan:      Problem List Items Addressed This Visit      Unprioritized   Class 2 obesity - Primary    We discussed some ways to increase physical activity.      Glucose intolerance (impaired glucose tolerance)    Continue with Ozempic.  Discussed low-carb higher protein nutrition. She is on Paxil though at a low dose.  We will try to keep it as low as this can promote weight gain but does control her symptoms with PMDD diagnosis as well as hot flashes.      Hyperlipidemia    Continue Lipitor.  Lastly check with her complete physical.      Relevant  Medications   atorvastatin (LIPITOR) 20 MG tablet   PMDD (premenstrual dysphoric disorder)   Relevant Medications   PARoxetine (PAXIL) 10 MG tablet      Note: This dictation was prepared with Dragon dictation along with smaller phrase technology. Any transcriptional errors that result from this process are unintentional.

## 2020-07-13 NOTE — Assessment & Plan Note (Addendum)
Continue with Ozempic.  Discussed low-carb higher protein nutrition. She is on Paxil though at a low dose.  We will try to keep it as low as this can promote weight gain but does control her symptoms with PMDD diagnosis as well as hot flashes.

## 2020-07-13 NOTE — Assessment & Plan Note (Signed)
Continue Lipitor.  Lastly check with her complete physical.

## 2020-07-13 NOTE — Assessment & Plan Note (Signed)
We discussed some ways to increase physical activity.

## 2020-07-13 NOTE — Patient Instructions (Addendum)
F/U schedule physical with Victoria Garcia after March 15th  [facility] [address]

## 2020-07-27 ENCOUNTER — Other Ambulatory Visit: Payer: Self-pay

## 2020-07-27 ENCOUNTER — Telehealth: Payer: Self-pay | Admitting: Family Medicine

## 2020-07-27 MED ORDER — OZEMPIC (1 MG/DOSE) 2 MG/1.5ML ~~LOC~~ SOPN: 1.0000 mg | PEN_INJECTOR | SUBCUTANEOUS | 3 refills | Status: DC

## 2020-07-27 NOTE — Telephone Encounter (Signed)
Received fax from [facility] pharmacy requesting a refill on ozempic (1mg /dose) 4 mg/81m requeting a 3 month supply

## 2020-08-06 ENCOUNTER — Encounter: Payer: 59 | Admitting: Nurse Practitioner

## 2020-08-17 ENCOUNTER — Other Ambulatory Visit: Payer: Self-pay

## 2020-08-17 ENCOUNTER — Encounter: Payer: Self-pay | Admitting: Nurse Practitioner

## 2020-08-17 ENCOUNTER — Ambulatory Visit: Payer: 59 | Admitting: Nurse Practitioner

## 2020-08-17 VITALS — BP 116/80 | HR 85 | Temp 97.2°F | Ht 64.0 in | Wt 205.0 lb

## 2020-08-17 DIAGNOSIS — E669 Obesity, unspecified: Secondary | ICD-10-CM | POA: Diagnosis not present

## 2020-08-17 DIAGNOSIS — E559 Vitamin D deficiency, unspecified: Secondary | ICD-10-CM | POA: Diagnosis not present

## 2020-08-17 DIAGNOSIS — F411 Generalized anxiety disorder: Secondary | ICD-10-CM | POA: Diagnosis not present

## 2020-08-17 DIAGNOSIS — F3281 Premenstrual dysphoric disorder: Secondary | ICD-10-CM

## 2020-08-17 DIAGNOSIS — Z0000 Encounter for general adult medical examination without abnormal findings: Secondary | ICD-10-CM | POA: Diagnosis not present

## 2020-08-17 DIAGNOSIS — Z0001 Encounter for general adult medical examination with abnormal findings: Secondary | ICD-10-CM | POA: Diagnosis not present

## 2020-08-17 DIAGNOSIS — R7302 Impaired glucose tolerance (oral): Secondary | ICD-10-CM

## 2020-08-17 DIAGNOSIS — E782 Mixed hyperlipidemia: Secondary | ICD-10-CM | POA: Diagnosis not present

## 2020-08-17 NOTE — Progress Notes (Signed)
BP 116/80   Pulse 85   Temp (!) 97.2 F (36.2 C)   Ht 5\' 4"  (1.626 m)   Wt 205 lb (93 kg)   SpO2 100%   BMI 35.19 kg/m    Subjective:    [name], female    DOB: 1961-01-21, 60 y.o.   MRN: [id number]  HPI: Victoria Garcia is a 60 y.o. female presenting on 08/17/2020 for comprehensive medical examination. Current medical complaints include:none   Has gotten down to 188 and would like to get back.  Currently taking Ozempic 1 mg on Saturdays.   She currently lives with: husband and 2 children (young adults), 20 and [id number].  LMP: Had ablation when was 56 or 57; no cycle since. Paxil helps control these symptoms.   Depression Screen done today and results listed below:  Depression screen [facility] 08/17/2020 07/13/2020 12/16/2019 08/05/2019 02/01/2019  Decreased Interest 0 0 0 0 2  Down, Depressed, Hopeless 0 0 0 0 2  PHQ - 2 Score 0 0 0 0 4  Altered sleeping 0 - - - 2  Tired, decreased energy 0 - - - 0  Change in appetite 0 - - - 0  Feeling bad or failure about yourself  0 - - - 1  Trouble concentrating 0 - - - 0  Moving slowly or fidgety/restless 0 - - - 0  Suicidal thoughts 0 - - - 0  PHQ-9 Score 0 - - - 7  Difficult doing work/chores Not difficult at all - - - Not difficult at all  Some recent data might be hidden   The patient does not have a history of falls. I did not complete a risk assessment for falls. A plan of care for falls was not documented.  Past Medical History:  Past Medical History:  Diagnosis Date  . Allergy    SEASONAL  . Anemia   . Back pain     Surgical History:  Past Surgical History:  Procedure Laterality Date  . BREAST REDUCTION SURGERY  2004   bilat  . BREAST SURGERY  2007   BREAST REDUCTION  . CESAREAN SECTION  2002  . COLONOSCOPY    . DILITATION & CURRETTAGE/HYSTROSCOPY WITH HYDROTHERMAL ABLATION N/A 04/08/2016   Procedure: HYSTEROSCOPY , HYDROTHERMAL ABLATION;  Surgeon: Jaymes GraffNaima Dillard, MD;  Location: [facility];  Service:  Gynecology;  Laterality: N/A;  . TONSILLECTOMY    . TUBAL LIGATION    . VAGINAL DELIVERY  1999    Medications:  Current Outpatient Medications on File Prior to Visit  Medication Sig  . atorvastatin (LIPITOR) 20 MG tablet Take 1 tablet (20 mg total) by mouth daily.  . Cholecalciferol (VITAMIN D) 2000 units tablet Take 1 tablet (2,000 Units total) by mouth daily.  Marland Kitchen. ELDERBERRY PO Take by mouth.  . levocetirizine (XYZAL) 5 MG tablet Take 5 mg by mouth every evening.  . Multiple Vitamin (MULTIVITAMIN WITH MINERALS) TABS tablet Take 1 tablet by mouth daily.  Marland Kitchen. PARoxetine (PAXIL) 10 MG tablet Take 1 tablet (10 mg total) by mouth at bedtime.  . Semaglutide, 1 MG/DOSE, (OZEMPIC, 1 MG/DOSE,) 2 MG/1.5ML SOPN Inject 1 mg into the skin once a week.   No current facility-administered medications on file prior to visit.    Allergies:  Allergies  Allergen Reactions  . Contrast Media [Iodinated Diagnostic Agents] Anaphylaxis  . Shellfish Allergy Anaphylaxis  . Penicillins Rash and Other (See Comments)    Has patient had a PCN reaction  causing immediate rash, facial/tongue/throat swelling, SOB or lightheadedness with hypotension: No Has patient had a PCN reaction causing severe rash involving mucus membranes or skin necrosis: No Has patient had a PCN reaction that required hospitalization No Has patient had a PCN reaction occurring within the last 10 years: No If all of the above answers are "NO", then may proceed with Cephalosporin use.  . Sulfa Antibiotics Rash    Social History:  Social History   Socioeconomic History  . Marital status: Married    Spouse name: Not on file  . Number of children: Not on file  . Years of education: Not on file  . Highest education level: Not on file  Occupational History  . Not on file  Tobacco Use  . Smoking status: Never Smoker  . Smokeless tobacco: Never Used  Substance and Sexual Activity  . Alcohol use: No  . Drug use: No  . Sexual activity:  Yes    Birth control/protection: Surgical  Other Topics Concern  . Not on file  Social History Narrative  . Not on file   Social Determinants of Health   Financial Resource Strain: Not on file  Food Insecurity: Not on file  Transportation Needs: Not on file  Physical Activity: Not on file  Stress: Not on file  Social Connections: Not on file  Intimate Partner Violence: Not on file   Social History   Tobacco Use  Smoking Status Never Smoker  Smokeless Tobacco Never Used   Social History   Substance and Sexual Activity  Alcohol Use No    Family History:  Family History  Problem Relation Age of Onset  . Cancer Mother 66       Squamous cell Primary unknown  . Hypertension Mother   . Cancer Father        Prostate cancer  . Diabetes Father   . Cancer Maternal Grandmother        STOMACH CANCER  . Cancer Maternal Grandfather        Prostate cancer  . Cancer Maternal Aunt        All mother siblings died from cancer    Past medical history, surgical history, medications, allergies, family history and social history reviewed with patient today and changes made to appropriate areas of the chart.   Review of Systems  Constitutional: Negative.   HENT: Negative.   Eyes: Negative.   Respiratory: Negative.   Cardiovascular: Negative.   Gastrointestinal: Negative.   Musculoskeletal: Negative.   Skin: Negative.   Neurological: Negative.   Psychiatric/Behavioral: Negative.       Objective:    BP 116/80   Pulse 85   Temp (!) 97.2 F (36.2 C)   Ht 5\' 4"  (1.626 m)   Wt 205 lb (93 kg)   SpO2 100%   BMI 35.19 kg/m   Wt Readings from Last 3 Encounters:  08/17/20 205 lb (93 kg)  07/13/20 204 lb (92.5 kg)  03/30/20 206 lb (93.4 kg)    Physical Exam Vitals and nursing note reviewed.  Constitutional:      General: She is not in acute distress.    Appearance: Normal appearance. She is not toxic-appearing.  HENT:     Head: Normocephalic and atraumatic.     Right  Ear: Tympanic membrane, ear canal and external ear normal. There is no impacted cerumen.     Left Ear: Tympanic membrane, ear canal and external ear normal. There is no impacted cerumen.     Nose: Nose  normal. No congestion.     Mouth/Throat:     Mouth: Mucous membranes are moist.     Pharynx: Oropharynx is clear. No oropharyngeal exudate or posterior oropharyngeal erythema.  Eyes:     General: No scleral icterus.       Right eye: No discharge.        Left eye: No discharge.     Extraocular Movements: Extraocular movements intact.     Pupils: Pupils are equal, round, and reactive to light.  Cardiovascular:     Rate and Rhythm: Normal rate and regular rhythm.     Heart sounds: Normal heart sounds. No murmur heard.   Pulmonary:     Effort: Pulmonary effort is normal. No respiratory distress.     Breath sounds: Normal breath sounds. No wheezing, rhonchi or rales.  Abdominal:     General: Abdomen is flat. Bowel sounds are normal. There is no distension.     Palpations: Abdomen is soft. There is no mass.     Tenderness: There is no abdominal tenderness. There is no guarding.  Musculoskeletal:        General: Normal range of motion.     Cervical back: Normal range of motion and neck supple.     Right lower leg: No edema.     Left lower leg: No edema.  Lymphadenopathy:     Cervical: No cervical adenopathy.  Skin:    General: Skin is warm and dry.     Capillary Refill: Capillary refill takes less than 2 seconds.     Coloration: Skin is not jaundiced or pale.     Findings: No erythema.  Neurological:     Mental Status: She is alert and oriented to person, place, and time.     Motor: No weakness.     Gait: Gait normal.  Psychiatric:        Mood and Affect: Mood normal.        Behavior: Behavior normal.        Thought Content: Thought content normal.        Judgment: Judgment normal.       Assessment & Plan:   Problem List Items Addressed This Visit      Endocrine   Glucose  intolerance (impaired glucose tolerance)    Continue Ozempic for now, patient is requesting referral to [facility], will place referral today.  Hgba1c checked today.      Relevant Orders   Hemoglobin A1c   Lipid Panel     Other   Vitamin D deficiency    Will recheck Vitamin D level today.  Continue supplementation for now.      Relevant Orders   VITAMIN D 25 Hydroxy (Vit-D Deficiency, Fractures)   PMDD (premenstrual dysphoric disorder)    S/p ablation.  Well controlled on Paxil 10 mg daily.  PHQ-9 and GAD-7 both zero today.  Will check CBC and continue Paxil for now.  Follow up in 6 months.      Relevant Orders   CBC with Differential   Hyperlipidemia    Last lipid panel looked great.  Will continue atorvastatin 20 mg daily for now and check fasting lipid panel today.  Follow up 6 months.      Relevant Orders   Lipid Panel   GAD (generalized anxiety disorder)    Chronic.  PHQ-9 and GAD-7 both zero today.  Mood appears to be well controlled with Paxil 10 mg, will continue for now.  Follow up in  6 months.      Class 2 obesity    Discussed lifestyle interventions including increasing physical activity - goal is 30 minutes 5 times weekly.  Will continue Ozempic to help with weight loss for now and will place referral to [facility] clinic per patient request.      Relevant Orders   Hemoglobin A1c   COMPLETE METABOLIC PANEL WITH GFR   Lipid Panel   Amb Ref to [facility]    Other Visit Diagnoses    Annual physical exam    -  Primary   Relevant Orders   Hemoglobin A1c   VITAMIN D 25 Hydroxy (Vit-D Deficiency, Fractures)   COMPLETE METABOLIC PANEL WITH GFR   CBC with Differential   Lipid Panel       Follow up plan: Return in about 6 months (around 02/17/2021) for PMDD, hld.   LABORATORY TESTING:  - Pap smear: not applicable  IMMUNIZATIONS:   - Tdap: Tetanus vaccination status reviewed: last tetanus booster within 10  years. - Influenza: Up to date - Pneumovax: Not applicable - Prevnar: Not applicable - HPV: Not applicable - Zostavax vaccine: Up to date - COVID-19 vaccine: Fully vaccinated with 2 shots and booster  SCREENING: -Mammogram: Up to date  - Colonoscopy: Up to date  - Bone Density: Not applicable  -Hearing Test: Not applicable  -Spirometry: Not applicable   PATIENT COUNSELING:   Advised to take 1 mg of folate supplement per day if capable of pregnancy.   Sexuality: Discussed sexually transmitted diseases, partner selection, use of condoms, avoidance of unintended pregnancy  and contraceptive alternatives.   Advised to avoid cigarette smoking.  I discussed with the patient that most people either abstain from alcohol or drink within safe limits (<=14/week and <=4 drinks/occasion for males, <=7/weeks and <= 3 drinks/occasion for females) and that the risk for alcohol disorders and other health effects rises proportionally with the number of drinks per week and how often a drinker exceeds daily limits.  Discussed cessation/primary prevention of drug use and availability of treatment for abuse.   Diet: Encouraged to adjust caloric intake to maintain  or achieve ideal body weight, to reduce intake of dietary saturated fat and total fat, to limit sodium intake by avoiding high sodium foods and not adding table salt, and to maintain adequate dietary potassium and calcium preferably from fresh fruits, vegetables, and low-fat dairy products.    stressed the importance of regular exercise  Injury prevention: Discussed safety belts, safety helmets, smoke detector, smoking near bedding or upholstery.   Dental health: Discussed importance of regular tooth brushing, flossing, and dental visits.    NEXT PREVENTATIVE PHYSICAL DUE IN 1 YEAR. Return in about 6 months (around 02/17/2021) for PMDD, hld.

## 2020-08-17 NOTE — Assessment & Plan Note (Addendum)
Chronic.  PHQ-9 and GAD-7 both zero today.  Mood appears to be well controlled with Paxil 10 mg, will continue for now.  Follow up in 6 months.

## 2020-08-17 NOTE — Assessment & Plan Note (Signed)
Will recheck Vitamin D level today.  Continue supplementation for now.

## 2020-08-17 NOTE — Assessment & Plan Note (Addendum)
S/p ablation.  Well controlled on Paxil 10 mg daily.  PHQ-9 and GAD-7 both zero today.  Will check CBC and continue Paxil for now.  Follow up in 6 months.

## 2020-08-17 NOTE — Assessment & Plan Note (Signed)
Continue Ozempic for now, patient is requesting referral to [facility] clinic, will place referral today.  Hgba1c checked today.

## 2020-08-17 NOTE — Assessment & Plan Note (Signed)
Discussed lifestyle interventions including increasing physical activity - goal is 30 minutes 5 times weekly.  Will continue Ozempic to help with weight loss for now and will place referral to [facility] clinic per patient request.

## 2020-08-17 NOTE — Patient Instructions (Addendum)
F/u 6 months  Nice meeting you today, Victoria Garcia!  We will message you with your lab results later this week.  Please let us know if you have not heard from the [facility] within about 2 weeks.  Take care! Victoria Garcia  Preventive Care 27-59 Years Old, Female Preventive care refers to lifestyle choices and visits with your health care provider that can promote health and wellness. This includes:  A yearly physical exam. This is also called an annual wellness visit.  Regular dental and eye exams.  Immunizations.  Screening for certain conditions.  Healthy lifestyle choices, such as: ? Eating a healthy diet. ? Getting regular exercise. ? Not using drugs or products that contain nicotine and tobacco. ? Limiting alcohol use. What can I expect for my preventive care visit? Physical exam Your health care provider will check your:  Height and weight. These may be used to calculate your BMI (body mass index). BMI is a measurement that tells if you are at a healthy weight.  Heart rate and blood pressure.  Body temperature.  Skin for abnormal spots. Counseling Your health care provider may ask you questions about your:  Past medical problems.  Family's medical history.  Alcohol, tobacco, and drug use.  Emotional well-being.  Home life and relationship well-being.  Sexual activity.  Diet, exercise, and sleep habits.  Work and work Statistician.  Access to firearms.  Method of birth control.  Menstrual cycle.  Pregnancy history. What immunizations do I need? Vaccines are usually given at various ages, according to a schedule. Your health care provider will recommend vaccines for you based on your age, medical history, and lifestyle or other factors, such as travel or where you work.   What tests do I need? Blood tests  Lipid and cholesterol levels. These may be checked every 5 years, or more often if you are over 13 years old.  Hepatitis C  test.  Hepatitis B test. Screening  Lung cancer screening. You may have this screening every year starting at age 59 if you have a 30-pack-year history of smoking and currently smoke or have quit within the past 15 years.  Colorectal cancer screening. ? All adults should have this screening starting at age 59 and continuing until age 64. ? Your health care provider may recommend screening at age 30 if you are at increased risk. ? You will have tests every 1-10 years, depending on your results and the type of screening test.  Diabetes screening. ? This is done by checking your blood sugar (glucose) after you have not eaten for a while (fasting). ? You may have this done every 1-3 years.  Mammogram. ? This may be done every 1-2 years. ? Talk with your health care provider about when you should start having regular mammograms. This may depend on whether you have a family history of breast cancer.  BRCA-related cancer screening. This may be done if you have a family history of breast, ovarian, tubal, or peritoneal cancers.  Pelvic exam and Pap test. ? This may be done every 3 years starting at age 59. ? Starting at age 18, this may be done every 5 years if you have a Pap test in combination with an HPV test. Other tests  STD (sexually transmitted disease) testing, if you are at risk.  Bone density scan. This is done to screen for osteoporosis. You may have this scan if you are at high risk for osteoporosis. Talk with your health care  provider about your test results, treatment options, and if necessary, the need for more tests. Follow these instructions at home: Eating and drinking  Eat a diet that includes fresh fruits and vegetables, whole grains, lean protein, and low-fat dairy products.  Take vitamin and mineral supplements as recommended by your health care provider.  Do not drink alcohol if: ? Your health care provider tells you not to drink. ? You are pregnant, may be  pregnant, or are planning to become pregnant.  If you drink alcohol: ? Limit how much you have to 0-1 drink a day. ? Be aware of how much alcohol is in your drink. In the [place] one drink equals one 12 oz bottle of beer (355 mL), one 5 oz glass of wine (148 mL), or one 1 oz glass of hard liquor (44 mL).   Lifestyle  Take daily care of your teeth and gums. Brush your teeth every morning and night with fluoride toothpaste. Floss one time each day.  Stay active. Exercise for at least 30 minutes 5 or more days each week.  Do not use any products that contain nicotine or tobacco, such as cigarettes, e-cigarettes, and chewing tobacco. If you need help quitting, ask your health care provider.  Do not use drugs.  If you are sexually active, practice safe sex. Use a condom or other form of protection to prevent STIs (sexually transmitted infections).  If you do not wish to become pregnant, use a form of birth control. If you plan to become pregnant, see your health care provider for a prepregnancy visit.  If told by your health care provider, take low-dose aspirin daily starting at age 24.  Find healthy ways to cope with stress, such as: ? Meditation, yoga, or listening to music. ? Journaling. ? Talking to a trusted person. ? Spending time with friends and family. Safety  Always wear your seat belt while driving or riding in a vehicle.  Do not drive: ? If you have been drinking alcohol. Do not ride with someone who has been drinking. ? When you are tired or distracted. ? While texting.  Wear a helmet and other protective equipment during sports activities.  If you have firearms in your house, make sure you follow all gun safety procedures. What's next?  Visit your health care provider once a year for an annual wellness visit.  Ask your health care provider how often you should have your eyes and teeth checked.  Stay up to date on all vaccines. This information is not intended to  replace advice given to you by your health care provider. Make sure you discuss any questions you have with your health care provider. Document Revised: 02/11/2020 Document Reviewed: 01/18/2018 Elsevier Patient Education  2021 [employer].

## 2020-08-17 NOTE — Assessment & Plan Note (Signed)
Last lipid panel looked great.  Will continue atorvastatin 20 mg daily for now and check fasting lipid panel today.  Follow up 6 months.

## 2020-08-18 LAB — COMPLETE METABOLIC PANEL WITH GFR
AG Ratio: 1.3 (calc) (ref 1.0–2.5)
ALT: 23 U/L (ref 6–29)
AST: 18 U/L (ref 10–35)
Albumin: 4 g/dL (ref 3.6–5.1)
Alkaline phosphatase (APISO): 101 U/L (ref 37–153)
BUN: 17 mg/dL (ref 7–25)
CO2: 27 mmol/L (ref 20–32)
Calcium: 9.2 mg/dL (ref 8.6–10.4)
Chloride: 104 mmol/L (ref 98–110)
Creat: 0.7 mg/dL (ref 0.50–1.05)
GFR, Est African American: 111 mL/min/{1.73_m2} (ref >=60)
GFR, Est Non African American: 96 mL/min/{1.73_m2} (ref >=60)
Globulin: 3.2 g/dL (calc) (ref 1.9–3.7)
Glucose, Bld: 96 mg/dL (ref 65–99)
Potassium: 4.2 mmol/L (ref 3.5–5.3)
Sodium: 141 mmol/L (ref 135–146)
Total Bilirubin: 0.6 mg/dL (ref 0.2–1.2)
Total Protein: 7.2 g/dL (ref 6.1–8.1)

## 2020-08-18 LAB — LIPID PANEL
Cholesterol: 155 mg/dL (ref <200)
HDL: 38 mg/dL — ABNORMAL LOW (ref >=50)
LDL Cholesterol (Calc): 98 mg/dL (calc)
Non-HDL Cholesterol (Calc): 117 mg/dL (calc) (ref <130)
Total CHOL/HDL Ratio: 4.1 (calc) (ref <5.0)
Triglycerides: 97 mg/dL (ref <150)

## 2020-08-18 LAB — HEMOGLOBIN A1C
Hgb A1c MFr Bld: 5.9 % of total Hgb — ABNORMAL HIGH (ref <5.7)
Mean Plasma Glucose: 123 mg/dL
eAG (mmol/L): 6.8 mmol/L

## 2020-08-18 LAB — CBC WITH DIFFERENTIAL/PLATELET
Absolute Monocytes: 504 cells/uL (ref 200–950)
Basophils Absolute: 21 cells/uL (ref 0–200)
Basophils Relative: 0.3 %
Eosinophils Absolute: 98 cells/uL (ref 15–500)
Eosinophils Relative: 1.4 %
HCT: 40.6 % (ref 35.0–45.0)
Hemoglobin: 13.5 g/dL (ref 11.7–15.5)
Lymphs Abs: 2156 cells/uL (ref 850–3900)
MCH: 30.1 pg (ref 27.0–33.0)
MCHC: 33.3 g/dL (ref 32.0–36.0)
MCV: 90.6 fL (ref 80.0–100.0)
MPV: 10.4 fL (ref 7.5–12.5)
Monocytes Relative: 7.2 %
Neutro Abs: 4221 cells/uL (ref 1500–7800)
Neutrophils Relative %: 60.3 %
Platelets: 222 10*3/uL (ref 140–400)
RBC: 4.48 10*6/uL (ref 3.80–5.10)
RDW: 12.6 % (ref 11.0–15.0)
Total Lymphocyte: 30.8 %
WBC: 7 10*3/uL (ref 3.8–10.8)

## 2020-08-18 LAB — VITAMIN D 25 HYDROXY (VIT D DEFICIENCY, FRACTURES): Vit D, 25-Hydroxy: 54 ng/mL (ref 30–100)

## 2020-10-27 ENCOUNTER — Other Ambulatory Visit: Payer: Self-pay

## 2020-10-27 ENCOUNTER — Encounter: Payer: Self-pay | Admitting: Nurse Practitioner

## 2020-10-27 ENCOUNTER — Ambulatory Visit (INDEPENDENT_AMBULATORY_CARE_PROVIDER_SITE_OTHER): Payer: 59 | Admitting: Family Medicine

## 2020-10-27 ENCOUNTER — Encounter (INDEPENDENT_AMBULATORY_CARE_PROVIDER_SITE_OTHER): Payer: Self-pay | Admitting: Family Medicine

## 2020-10-27 VITALS — BP 115/76 | HR 78 | Temp 98.0°F | Ht 64.0 in | Wt 200.0 lb

## 2020-10-27 DIAGNOSIS — E7849 Other hyperlipidemia: Secondary | ICD-10-CM

## 2020-10-27 DIAGNOSIS — R0602 Shortness of breath: Secondary | ICD-10-CM | POA: Diagnosis not present

## 2020-10-27 DIAGNOSIS — G8929 Other chronic pain: Secondary | ICD-10-CM

## 2020-10-27 DIAGNOSIS — R7303 Prediabetes: Secondary | ICD-10-CM | POA: Insufficient documentation

## 2020-10-27 DIAGNOSIS — E669 Obesity, unspecified: Secondary | ICD-10-CM | POA: Diagnosis not present

## 2020-10-27 DIAGNOSIS — F3289 Other specified depressive episodes: Secondary | ICD-10-CM

## 2020-10-27 DIAGNOSIS — Z9189 Other specified personal risk factors, not elsewhere classified: Secondary | ICD-10-CM | POA: Diagnosis not present

## 2020-10-27 DIAGNOSIS — R5383 Other fatigue: Secondary | ICD-10-CM | POA: Insufficient documentation

## 2020-10-27 DIAGNOSIS — E559 Vitamin D deficiency, unspecified: Secondary | ICD-10-CM

## 2020-10-27 DIAGNOSIS — F32A Depression, unspecified: Secondary | ICD-10-CM | POA: Insufficient documentation

## 2020-10-27 DIAGNOSIS — Z6834 Body mass index (BMI) 34.0-34.9, adult: Secondary | ICD-10-CM

## 2020-10-27 DIAGNOSIS — Z0289 Encounter for other administrative examinations: Secondary | ICD-10-CM

## 2020-10-27 DIAGNOSIS — M2550 Pain in unspecified joint: Secondary | ICD-10-CM

## 2020-10-28 LAB — INSULIN, RANDOM: INSULIN: 14.3 u[IU]/mL (ref 2.6–24.9)

## 2020-10-28 LAB — TSH: TSH: 0.859 u[IU]/mL (ref 0.450–4.500)

## 2020-10-28 LAB — FOLATE: Folate: >20 ng/mL (ref >3.0)

## 2020-10-28 LAB — T4, FREE: Free T4: 1.19 ng/dL (ref 0.82–1.77)

## 2020-10-28 LAB — VITAMIN B12: Vitamin B-12: 1173 pg/mL (ref 232–1245)

## 2020-10-28 NOTE — Telephone Encounter (Signed)
Pt is asking about change in medication therapy based on the information gained from pharmacist regarding Paxil to Prozac

## 2020-10-30 ENCOUNTER — Other Ambulatory Visit (HOSPITAL_COMMUNITY): Payer: Self-pay

## 2020-10-30 ENCOUNTER — Telehealth: Payer: Self-pay | Admitting: Nurse Practitioner

## 2020-10-30 ENCOUNTER — Other Ambulatory Visit: Payer: Self-pay

## 2020-10-30 MED ORDER — FLUOXETINE HCL 10 MG PO CAPS
10.0000 mg | ORAL_CAPSULE | Freq: Every day | ORAL | 1 refills | Status: DC
  Filled 2020-10-30: qty 90, 90d supply, fill #0

## 2020-10-30 MED ORDER — SEMAGLUTIDE (1 MG/DOSE) 4 MG/3ML ~~LOC~~ SOPN
1.0000 mg | PEN_INJECTOR | SUBCUTANEOUS | 3 refills | Status: DC
  Filled 2020-10-30 (×2): qty 9, 84d supply, fill #0

## 2020-10-30 MED FILL — Semaglutide Soln Pen-inj 1 MG/DOSE (4 MG/3ML): SUBCUTANEOUS | 28 days supply | Qty: 3 | Fill #0 | Status: CN

## 2020-10-30 NOTE — Telephone Encounter (Signed)
Adriane from [facility] called asking for a refill of Ozempic. She also stated that pt says she is down to her last pen, and would like to know if the refill could be changed to a 3 month supply as this is more cost effective for the pt. Please advise.  [facility]  Cb#: [phone]

## 2020-11-02 ENCOUNTER — Other Ambulatory Visit (HOSPITAL_COMMUNITY): Payer: Self-pay

## 2020-11-03 ENCOUNTER — Other Ambulatory Visit (HOSPITAL_COMMUNITY): Payer: Self-pay

## 2020-11-04 NOTE — Progress Notes (Signed)
Dear Cathlean MarseillesJessica Martinez, NP,   Thank you for referring Victoria Garcia to our clinic. The following note includes my evaluation and treatment recommendations.  Chief Complaint:   OBESITY Victoria Garcia (MR# [id number]) is a 60 y.o. female who presents for evaluation and treatment of obesity and related comorbidities. Current BMI is Body mass index is 34.33 kg/m. Victoria Garcia has been struggling with her weight for many years and has been unsuccessful in either losing weight, maintaining weight loss, or reaching her healthy weight goal.  Victoria Garcia is currently in the action stage of change and ready to dedicate time achieving and maintaining a healthier weight. Victoria Garcia is interested in becoming our patient and working on intensive lifestyle modifications including (but not limited to) diet and exercise for weight loss.  Victoria Garcia lives with her husband, Victoria Garcia, and sons (22, [phone], and 10) at home.  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  Lost 11 pounds in the past 10 months with eating better, taking Ozempic, and working out 3 days per week.  She craves carbs (chips, crackers, snack foods, candy, ice cream).  Drinks a lot of caloric beverages.  Victoria Garcia habits were reviewed today and are as follows: Her family eats meals together, she thinks her family will eat healthier with her, her desired weight loss is 32 pounds, she has been heavy most of her life, she started gaining weight after childbirth, her heaviest weight ever was 225 pounds, she craves carbs, chips, snack food, popcorn, crackers, candy, and chewing gum, she snacks frequently in the evenings, she skips breakfast frequently, she is frequently drinking liquids with calories, she frequently makes poor food choices, and she struggles with emotional eating.  Depression Screen Victoria Garcia Food and Mood (modified PHQ-9) score was 8.  Depression screen PHQ 2/9 10/27/2020  Decreased Interest 1  Down, Depressed, Hopeless 1  PHQ  - 2 Score 2  Altered sleeping 0  Tired, decreased energy 1  Change in appetite 1  Feeling bad or failure about yourself  2  Trouble concentrating 2  Moving slowly or fidgety/restless 0  Suicidal thoughts 0  PHQ-9 Score 8  Difficult doing work/chores Not difficult at all  Some recent data might be hidden   Assessment/Plan:   Orders Placed This Encounter  Procedures   Vitamin B12   Folate   Insulin, random   T4, free   TSH   EKG 12-Lead   Medications Discontinued During This Encounter  Medication Reason   Semaglutide, 1 MG/DOSE, (OZEMPIC, 1 MG/DOSE,) 2 MG/1.5ML SOPN    nitrofurantoin, macrocrystal-monohydrate, (MACROBID) 100 MG capsule    1. Other fatigue Victoria Garcia admits to daytime somnolence and reports waking up still tired. Patent has a history of symptoms of daytime fatigue, morning fatigue, and snoring. Victoria Garcia generally gets 6 hours of sleep per night, and states that she has generally restful sleep. Snoring is present. Apneic episodes are not present. Epworth Sleepiness Score is 5.  Victoria Garcia does feel that her weight is causing her energy to be lower than it should be. Fatigue may be related to obesity, depression or many other causes. Labs will be ordered, and in the meanwhile, Victoria Garcia will focus on self care including making healthy food choices, increasing physical activity and focusing on stress reduction.  Check EKG and labs.  - EKG 12-Lead - Vitamin B12 - Folate - T4, free - TSH  2. SOBOE (shortness of breath on exertion) Victoria Garcia notes increasing shortness of breath with exercising  and seems to be worsening over time with weight gain. She notes getting out of breath sooner with activity than she used to. This has gotten worse recently. Victoria Garcia denies shortness of breath at rest or orthopnea.  Victoria Garcia does feel that she gets out of breath more easily that she used to when she exercises. Victoria Garcia shortness of breath appears to be obesity related and exercise induced. She has  agreed to work on weight loss and gradually increase exercise to treat her exercise induced shortness of breath. Will continue to monitor closely.  Check IC and labs.  - Vitamin B12 - Folate - T4, free - TSH  3. Prediabetes Not at goal. Goal is HgbA1c < 5.7.  Medication: Ozempic 1 mg subcutaneously weekly.  Never told she was prediabetic in the past.  She has tried phentermine, Victoza, Saxenda in the past.  Plan:  She will continue to focus on protein-rich, low simple carbohydrate foods. We reviewed the importance of hydration, regular exercise for stress reduction, and restorative sleep. Check fasting insulin.  A1c recently checked.  Review all at next office visit.  Follow prudent nutritional plan and weight loss.  Lab Results  Component Value Date   HGBA1C 5.9 (H) 08/17/2020   Lab Results  Component Value Date   INSULIN 14.3 10/27/2020   - Insulin, random  4. Other hyperlipidemia Course: Not at goal. Lipid-lowering medications: Lipitor 20 mg daily.   Plan: Dietary changes: Increase soluble fiber, decrease simple carbohydrates, decrease saturated fat. Exercise changes: Moderate to vigorous-intensity aerobic activity 150 minutes per week or as tolerated. We will continue to monitor along with PCP/specialists as it pertains to her weight loss journey.  Labs recently done at PCP's office.  Will review them all with her at next office visit.  Lab Results  Component Value Date   CHOL 155 08/17/2020   HDL 38 (L) 08/17/2020   LDLCALC 98 08/17/2020   TRIG 97 08/17/2020   CHOLHDL 4.1 08/17/2020   Lab Results  Component Value Date   ALT 23 08/17/2020   AST 18 08/17/2020   ALKPHOS 92 01/13/2017   BILITOT 0.6 08/17/2020   The 10-year ASCVD risk score Denman George DC Jr., et al., 2013) is: 7.6%   Values used to calculate the score:     Age: 26 years     Sex: Female     Is Non-Hispanic African American: Yes     Diabetic: Yes     Tobacco smoker: No     Systolic Blood Pressure: 115  mmHg     Is BP treated: No     HDL Cholesterol: 38 mg/dL     Total Cholesterol: 155 mg/dL  - Vitamin L87 - Folate - T4, free - TSH  5. Chronic joint pain Pain in back, knees, etc.    Plan:  Start prudent nutritional plan, weight loss, and eventually increase exercise.  6. Vitamin D deficiency At goal. Current vitamin D is 54.0, tested on 08/17/2020. Optimal goal > 50 ng/dL.  She is taking OTC vitamin D 2,000 IU daily.  Plan: Continue current OTC vitamin D supplementation.  Follow-up for routine testing of Vitamin D, at least 2-3 times per year to avoid over-replacement.  7. Other depression, with emotional eating Not at goal. Medication: Paxil 10 mg daily.  PHQ-9 is 8.  Been on medication for 7-8 months.  This is her second time on Paxil.  She has never tried other medications.  Started initially for hot flashes and being "hot headed".  Counseling done.  Plan:  Averianna will discuss with PCP alternative treatments besides Paxil such as Wellbutrin or Prozac that may be useful instead of Paxil. Behavior modification techniques were discussed today to help deal with emotional/non-hunger eating behaviors.  8. At risk for diabetes mellitus - Camellia was given diabetes prevention education and counseling today of more than 23 minutes.  - Counseled patient on pathophysiology of disease and meaning/ implication of lab results.  - Reviewed how certain foods can either stimulate or inhibit insulin release, and subsequently affect hunger pathways  - Importance of following a healthy meal plan with limiting amounts of simple carbohydrates discussed with patient - Effects of regular aerobic exercise on blood sugar regulation reviewed and encouraged an eventual goal of 30 min 5d/week or more as a minimum.  - Briefly discussed treatment options, which always include dietary and lifestyle modification as first line.   - Handouts provided at patient's desire and/or told to go online to the American  Diabetes Association website for further information.  9. Class 1 obesity with serious comorbidity and body mass index (BMI) of 34.0 to 34.9 in adult, unspecified obesity type  Victoria Garcia is currently in the action stage of change and her goal is to continue with weight loss efforts. I recommend Victoria Garcia begin the structured treatment plan as follows:  She has agreed to the Category 2 Plan.  Exercise goals:  As is.    Behavioral modification strategies: increasing lean protein intake, decreasing simple carbohydrates, decreasing liquid calories, no skipping meals, and planning for success.  She was informed of the importance of frequent follow-up visits to maximize her success with intensive lifestyle modifications for her multiple health conditions. She was informed we would discuss her lab results at her next visit unless there is a critical issue that needs to be addressed sooner. Victoria Garcia agreed to keep her next visit at the agreed upon time to discuss these results.  Objective:   Blood pressure 115/76, pulse 78, temperature 98 F (36.7 C), height 5\' 4"  (1.626 m), weight 200 lb (90.7 kg), SpO2 98 %. Body mass index is 34.33 kg/m.  EKG: Normal sinus rhythm, rate 80 bpm.  Indirect Calorimeter completed today shows a VO2 of 273 and a REE of 1899.  Her calculated basal metabolic rate is thus her basal metabolic rate is better than expected.  General: Cooperative, alert, well developed, in no acute distress. HEENT: Conjunctivae and lids unremarkable. Cardiovascular: Regular rhythm.  Lungs: Normal work of breathing. Neurologic: No focal deficits.   Lab Results  Component Value Date   CREATININE 0.70 08/17/2020   BUN 17 08/17/2020   NA 141 08/17/2020   K 4.2 08/17/2020   CL 104 08/17/2020   CO2 27 08/17/2020   Lab Results  Component Value Date   ALT 23 08/17/2020   AST 18 08/17/2020   ALKPHOS 92 01/13/2017   BILITOT 0.6 08/17/2020   Lab Results  Component Value Date   HGBA1C  5.9 (H) 08/17/2020   HGBA1C 6.1 (H) 03/30/2020   HGBA1C 6.2 (H) 12/16/2019   HGBA1C 6.1 (H) 08/05/2019   HGBA1C 5.9 (H) 02/01/2019   Lab Results  Component Value Date   INSULIN 14.3 10/27/2020   Lab Results  Component Value Date   TSH 0.859 10/27/2020   Lab Results  Component Value Date   CHOL 155 08/17/2020   HDL 38 (L) 08/17/2020   LDLCALC 98 08/17/2020   TRIG 97 08/17/2020   CHOLHDL 4.1 08/17/2020   Lab Results  Component Value Date   WBC 7.0 08/17/2020   HGB 13.5 08/17/2020   HCT 40.6 08/17/2020   MCV 90.6 08/17/2020   PLT 222 08/17/2020   Lab Results  Component Value Date   IRON 50 09/24/2014   Attestation Statements:   This is the patient's first visit at [facility]. The patient's NEW PATIENT PACKET was reviewed at length. Included in the packet: current and past health history, medications, allergies, ROS, gynecologic history (women only), surgical history, family history, social history, weight history, weight loss surgery history (for those that have had weight loss surgery), nutritional evaluation, mood and food questionnaire, PHQ9, Epworth questionnaire, sleep habits questionnaire, patient life and health improvement goals questionnaire. These will all be scanned into the patient's chart under media.   During the visit, I independently reviewed the patient's EKG, bioimpedance scale results, and indirect calorimeter results. I used this information to tailor a meal plan for the patient that will help her to lose weight and will improve her obesity-related conditions going forward. I performed a medically necessary appropriate examination and/or evaluation. I discussed the assessment and treatment plan with the patient. The patient was provided an opportunity to ask questions and all were answered. The patient agreed with the plan and demonstrated an understanding of the instructions. Labs were ordered at this visit and will be reviewed at the next  visit unless more critical results need to be addressed immediately. Clinical information was updated and documented in the EMR.   I, Insurance claims handler, CMA, am acting as Energy manager for [employer], DO.  I have reviewed the above documentation for accuracy and completeness, and I agree with the above. [name], D.O.  The 21st Century Cures Act was signed into law in 2016 which includes the topic of electronic health records.  This provides immediate access to information in MyChart.  This includes consultation notes, operative notes, office notes, lab results and pathology reports.  If you have any questions about what you read please let us know at your next visit so we can discuss your concerns and take corrective action if need be.  We are right here with you.

## 2020-11-05 ENCOUNTER — Other Ambulatory Visit (HOSPITAL_COMMUNITY): Payer: Self-pay

## 2020-11-11 ENCOUNTER — Encounter (INDEPENDENT_AMBULATORY_CARE_PROVIDER_SITE_OTHER): Payer: Self-pay | Admitting: Family Medicine

## 2020-11-11 ENCOUNTER — Ambulatory Visit (INDEPENDENT_AMBULATORY_CARE_PROVIDER_SITE_OTHER): Payer: 59 | Admitting: Family Medicine

## 2020-11-11 ENCOUNTER — Other Ambulatory Visit: Payer: Self-pay

## 2020-11-11 VITALS — BP 112/74 | HR 79 | Temp 98.8°F | Ht 64.0 in | Wt 196.0 lb

## 2020-11-11 DIAGNOSIS — E559 Vitamin D deficiency, unspecified: Secondary | ICD-10-CM | POA: Diagnosis not present

## 2020-11-11 DIAGNOSIS — E669 Obesity, unspecified: Secondary | ICD-10-CM

## 2020-11-11 DIAGNOSIS — Z6833 Body mass index (BMI) 33.0-33.9, adult: Secondary | ICD-10-CM | POA: Diagnosis not present

## 2020-11-11 DIAGNOSIS — E7849 Other hyperlipidemia: Secondary | ICD-10-CM

## 2020-11-11 DIAGNOSIS — R7303 Prediabetes: Secondary | ICD-10-CM

## 2020-11-13 ENCOUNTER — Other Ambulatory Visit (HOSPITAL_COMMUNITY): Payer: Self-pay

## 2020-11-13 MED FILL — Atorvastatin Calcium Tab 20 MG (Base Equivalent): ORAL | 90 days supply | Qty: 90 | Fill #0 | Status: AC

## 2020-11-18 NOTE — Progress Notes (Signed)
Chief Complaint:   OBESITY Victoria Garcia is here to discuss her progress with her obesity treatment plan along with follow-up of her obesity related diagnoses.   Today's visit was #: 2 Starting weight: 200 lbs Starting date: 10/27/2020 Today's weight: 196 lbs Today's date: 11/11/2020 Weight change since last visit: 4 lbs Total lbs lost to date: 4 lbs Body mass index is 33.64 kg/m.  Total weight loss percentage to date: -2.00%  Interim History: Victoria Garcia is here today for her first follow-up office visit since starting the program with [name].  All blood work/ lab tests that were recently ordered by myself or an outside provider were reviewed with patient today per their request.   Extended time was spent counseling her on all new disease processes that were discovered or preexisting ones that are worsening.  she understands that many of these abnormalities will need to monitored regularly along with the current treatment plan of prudent dietary changes, in which we are making each and every office visit, to improve these health parameters.  Current Meal Plan: the Category 2 Plan for 75% of the time.  Current Exercise Plan: Pilates for 60 minutes 1 time per week. Current Anti-Obesity Medications: Ozempic 1 mg subcutaneously weekly. Side effects: None.  Assessment/Plan:   1. Prediabetes Not at goal. Goal is HgbA1c < 5.7.  Medication: Ozempic 1 mg subcutaneously weekly.  Started on Ozempic in early 2022.  Increased in [place] to 1 mg by PCP.  Plan:  Discussed labs with patient today.  She will continue to focus on protein-rich, low simple carbohydrate foods. We reviewed the importance of hydration, regular exercise for stress reduction, and restorative sleep.  No need for increase in dose.  Lab Results  Component Value Date   HGBA1C 5.9 (H) 08/17/2020   Lab Results  Component Value Date   INSULIN 14.3 10/27/2020   2. Other hyperlipidemia Course: Not optimized. Lipid-lowering  medications: Lipitor 20 mg daily.   Plan:  Continue Lipitor, prudent nutritional plan, and eventually exercise.  Discussed labs with patient today.  Dietary changes: Increase soluble fiber, decrease simple carbohydrates, decrease saturated fat. Exercise changes: Moderate to vigorous-intensity aerobic activity 150 minutes per week or as tolerated. We will continue to monitor along with PCP/specialists as it pertains to her weight loss journey.  Lab Results  Component Value Date   CHOL 155 08/17/2020   HDL 38 (L) 08/17/2020   LDLCALC 98 08/17/2020   TRIG 97 08/17/2020   CHOLHDL 4.1 08/17/2020   Lab Results  Component Value Date   ALT 23 08/17/2020   AST 18 08/17/2020   ALKPHOS 92 01/13/2017   BILITOT 0.6 08/17/2020   The 10-year ASCVD risk score Denman George DC Jr., et al., 2013) is: 7%   Values used to calculate the score:     Age: 60 years     Sex: Female     Is Non-Hispanic African American: Yes     Diabetic: Yes     Tobacco smoker: No     Systolic Blood Pressure: 112 mmHg     Is BP treated: No     HDL Cholesterol: 38 mg/dL     Total Cholesterol: 155 mg/dL  3. Vitamin D deficiency At goal.  She is taking OTC vitamin D 2,000 IU daily.  Plan:  Discussed labs with patient today.  Continue current OTC vitamin D supplementation.  Follow-up for routine testing of Vitamin D, at least 2-3 times per year to avoid over-replacement.  Lab  Results  Component Value Date   VD25OH 54 08/17/2020   VD25OH 58 08/05/2019   VD25OH 42 08/01/2017   4. Class 1 obesity with serious comorbidity and body mass index (BMI) of 33.0 to 33.9 in adult, unspecified obesity type  Course: Victoria Garcia is currently in the action stage of change. As such, her goal is to continue with weight loss efforts.   Nutrition goals: She has agreed to the Category 2 Plan.   Exercise goals:  As is.  Behavioral modification strategies: increasing lean protein intake, decreasing simple carbohydrates, decreasing eating out, no  skipping meals, and planning for success.  Victoria Garcia has agreed to follow-up with our clinic in 2-3 weeks. She was informed of the importance of frequent follow-up visits to maximize her success with intensive lifestyle modifications for her multiple health conditions.   Objective:   Blood pressure 112/74, pulse 79, temperature 98.8 F (37.1 C), height 5\' 4"  (1.626 m), weight 196 lb (88.9 kg), SpO2 98 %. Body mass index is 33.64 kg/m.  General: Cooperative, alert, well developed, in no acute distress. HEENT: Conjunctivae and lids unremarkable. Cardiovascular: Regular rhythm.  Lungs: Normal work of breathing. Neurologic: No focal deficits.   Lab Results  Component Value Date   CREATININE 0.70 08/17/2020   BUN 17 08/17/2020   NA 141 08/17/2020   K 4.2 08/17/2020   CL 104 08/17/2020   CO2 27 08/17/2020   Lab Results  Component Value Date   ALT 23 08/17/2020   AST 18 08/17/2020   ALKPHOS 92 01/13/2017   BILITOT 0.6 08/17/2020   Lab Results  Component Value Date   HGBA1C 5.9 (H) 08/17/2020   HGBA1C 6.1 (H) 03/30/2020   HGBA1C 6.2 (H) 12/16/2019   HGBA1C 6.1 (H) 08/05/2019   HGBA1C 5.9 (H) 02/01/2019   Lab Results  Component Value Date   INSULIN 14.3 10/27/2020   Lab Results  Component Value Date   TSH 0.859 10/27/2020   Lab Results  Component Value Date   CHOL 155 08/17/2020   HDL 38 (L) 08/17/2020   LDLCALC 98 08/17/2020   TRIG 97 08/17/2020   CHOLHDL 4.1 08/17/2020   Lab Results  Component Value Date   VD25OH 54 08/17/2020   VD25OH 58 08/05/2019   VD25OH 42 08/01/2017   Lab Results  Component Value Date   WBC 7.0 08/17/2020   HGB 13.5 08/17/2020   HCT 40.6 08/17/2020   MCV 90.6 08/17/2020   PLT 222 08/17/2020   Lab Results  Component Value Date   IRON 50 09/24/2014   Attestation Statements:   Reviewed by clinician on day of visit: allergies, medications, problem list, medical history, surgical history, family history, social history, and previous  encounter notes.  Time spent on visit including pre-visit chart review and post-visit care and charting was 42 minutes.   I, 11/24/2014, CMA, am acting as Insurance claims handler for Energy manager, DO.  I have reviewed the above documentation for accuracy and completeness, and I agree with the above. [employer], D.O.  The 21st Century Cures Act was signed into law in 2016 which includes the topic of electronic health records.  This provides immediate access to information in MyChart.  This includes consultation notes, operative notes, office notes, lab results and pathology reports.  If you have any questions about what you read please let 2017 know at your next visit so we can discuss your concerns and take corrective action if need be.  We are right here with  you.

## 2020-11-19 ENCOUNTER — Other Ambulatory Visit (HOSPITAL_COMMUNITY): Payer: Self-pay

## 2020-11-30 ENCOUNTER — Ambulatory Visit (INDEPENDENT_AMBULATORY_CARE_PROVIDER_SITE_OTHER): Payer: 59 | Admitting: Family Medicine

## 2020-11-30 ENCOUNTER — Other Ambulatory Visit: Payer: Self-pay

## 2020-11-30 VITALS — BP 123/74 | HR 72 | Temp 98.3°F | Ht 64.0 in | Wt 193.0 lb

## 2020-11-30 DIAGNOSIS — R7303 Prediabetes: Secondary | ICD-10-CM | POA: Diagnosis not present

## 2020-11-30 DIAGNOSIS — E669 Obesity, unspecified: Secondary | ICD-10-CM | POA: Diagnosis not present

## 2020-11-30 DIAGNOSIS — Z6834 Body mass index (BMI) 34.0-34.9, adult: Secondary | ICD-10-CM

## 2020-12-03 ENCOUNTER — Encounter: Payer: Self-pay | Admitting: Nurse Practitioner

## 2020-12-04 NOTE — Telephone Encounter (Signed)
Please have patient schedule appointment - virtual would be okay - to discuss symptoms.

## 2020-12-10 NOTE — Progress Notes (Signed)
Chief Complaint:   OBESITY Victoria Garcia is here to discuss her progress with her obesity treatment plan along with follow-up of her obesity related diagnoses.   Today's visit was #: 3 Starting weight: 200 lbs Starting date: 10/27/2020 Today's weight: 193 lbs Today's date: 11/30/2020 Weight change since last visit: 3 lbs Total lbs lost to date: 7 lbs Body mass index is 33.13 kg/m.  Total weight loss percentage to date: -3.50%  Interim History: Victoria Garcia tested positive for COVID on June 29.  She has decreased taste still, but overall meal plan going well.  She finds journaling on her own in addition to following Category 2 helpful in keeping her on track.  Plan:  Her goals for the next 2 weeks are to increase water to 60 ounces per day and meet protein goal of 10 ounces per day at lunch and dinner.  Current Meal Plan: the Category 2 Plan with journaling what she eats in addition for 75% of the time.  Current Exercise Plan: None. Current Anti-Obesity Medications: Ozempic 1 mg subcutaneously weekly. Side effects: None.  Assessment/Plan:   Orders Placed This Encounter  Procedures   Hemoglobin A1c   1. Prediabetes Not at goal. Goal is HgbA1c < 5.7.  Medication: Ozempic 1 mg subcutaneously weekly.  She has been on Ozempic since March.  Ozempic working well and she feels nauseated if she eats too many carbs/not following meal plan.  Declines need for dose increase.  Plan:  She will continue to focus on protein-rich, low simple carbohydrate foods. We reviewed the importance of hydration, regular exercise for stress reduction, and restorative sleep.  Check A1c at next visit.  Continue Ozempic at current dose.  Continue to decrease simple carbs and increase protein.  Lab Results  Component Value Date   HGBA1C 5.9 (H) 08/17/2020   Lab Results  Component Value Date   INSULIN 14.3 10/27/2020   - Hemoglobin A1c  2. Class 1 obesity with serious comorbidity and body mass index (BMI) of 34.0  to 34.9 in adult, unspecified obesity type  Course: Victoria Garcia is currently in the action stage of change. As such, her goal is to continue with weight loss efforts.   Nutrition goals: She has agreed to the Category 2 Plan with journaling what she eats in addition.   Exercise goals:  As is.  Behavioral modification strategies: increasing lean protein intake, decreasing simple carbohydrates, and increasing water intake.  Victoria Garcia has agreed to follow-up with our clinic in 2 weeks.  Recheck A1c.  She was informed of the importance of frequent follow-up visits to maximize her success with intensive lifestyle modifications for her multiple health conditions.   Objective:   Blood pressure 123/74, pulse 72, temperature 98.3 F (36.8 C), height 5\' 4"  (1.626 m), weight 193 lb (87.5 kg), SpO2 99 %. Body mass index is 33.13 kg/m.  General: Cooperative, alert, well developed, in no acute distress. HEENT: Conjunctivae and lids unremarkable. Cardiovascular: Regular rhythm.  Lungs: Normal work of breathing. Neurologic: No focal deficits.   Lab Results  Component Value Date   CREATININE 0.70 08/17/2020   BUN 17 08/17/2020   NA 141 08/17/2020   K 4.2 08/17/2020   CL 104 08/17/2020   CO2 27 08/17/2020   Lab Results  Component Value Date   ALT 23 08/17/2020   AST 18 08/17/2020   ALKPHOS 92 01/13/2017   BILITOT 0.6 08/17/2020   Lab Results  Component Value Date   HGBA1C 5.9 (H) 08/17/2020  HGBA1C 6.1 (H) 03/30/2020   HGBA1C 6.2 (H) 12/16/2019   HGBA1C 6.1 (H) 08/05/2019   HGBA1C 5.9 (H) 02/01/2019   Lab Results  Component Value Date   INSULIN 14.3 10/27/2020   Lab Results  Component Value Date   TSH 0.859 10/27/2020   Lab Results  Component Value Date   CHOL 155 08/17/2020   HDL 38 (L) 08/17/2020   LDLCALC 98 08/17/2020   TRIG 97 08/17/2020   CHOLHDL 4.1 08/17/2020   Lab Results  Component Value Date   VD25OH 54 08/17/2020   VD25OH 58 08/05/2019   VD25OH 42 08/01/2017    Lab Results  Component Value Date   WBC 7.0 08/17/2020   HGB 13.5 08/17/2020   HCT 40.6 08/17/2020   MCV 90.6 08/17/2020   PLT 222 08/17/2020   Lab Results  Component Value Date   IRON 50 09/24/2014   Attestation Statements:   Reviewed by clinician on day of visit: allergies, medications, problem list, medical history, surgical history, family history, social history, and previous encounter notes.  Time spent on visit including pre-visit chart review and post-visit care and charting was 20 minutes.   I, Insurance claims handler, CMA, am acting as Energy manager for [employer], DO.  I have reviewed the above documentation for accuracy and completeness, and I agree with the above. [name], D.O.  The 21st Century Cures Act was signed into law in 2016 which includes the topic of electronic health records.  This provides immediate access to information in MyChart.  This includes consultation notes, operative notes, office notes, lab results and pathology reports.  If you have any questions about what you read please let us know at your next visit so we can discuss your concerns and take corrective action if need be.  We are right here with you.

## 2020-12-14 DIAGNOSIS — Z1231 Encounter for screening mammogram for malignant neoplasm of breast: Secondary | ICD-10-CM | POA: Diagnosis not present

## 2020-12-14 LAB — HM MAMMOGRAPHY

## 2020-12-16 ENCOUNTER — Ambulatory Visit (INDEPENDENT_AMBULATORY_CARE_PROVIDER_SITE_OTHER): Payer: 59 | Admitting: Family Medicine

## 2020-12-16 ENCOUNTER — Encounter (INDEPENDENT_AMBULATORY_CARE_PROVIDER_SITE_OTHER): Payer: Self-pay | Admitting: Family Medicine

## 2020-12-16 ENCOUNTER — Other Ambulatory Visit: Payer: Self-pay

## 2020-12-16 ENCOUNTER — Other Ambulatory Visit (HOSPITAL_COMMUNITY): Payer: Self-pay

## 2020-12-16 VITALS — BP 110/74 | HR 85 | Temp 98.7°F | Ht 64.0 in | Wt 193.0 lb

## 2020-12-16 DIAGNOSIS — F3289 Other specified depressive episodes: Secondary | ICD-10-CM | POA: Diagnosis not present

## 2020-12-16 DIAGNOSIS — Z6834 Body mass index (BMI) 34.0-34.9, adult: Secondary | ICD-10-CM | POA: Diagnosis not present

## 2020-12-16 DIAGNOSIS — Z9189 Other specified personal risk factors, not elsewhere classified: Secondary | ICD-10-CM

## 2020-12-16 DIAGNOSIS — E669 Obesity, unspecified: Secondary | ICD-10-CM | POA: Diagnosis not present

## 2020-12-16 MED ORDER — BUPROPION HCL ER (SR) 100 MG PO TB12
100.0000 mg | ORAL_TABLET | Freq: Every day | ORAL | 0 refills | Status: DC
  Filled 2020-12-16: qty 30, 30d supply, fill #0

## 2020-12-21 NOTE — Progress Notes (Signed)
Chief Complaint:   OBESITY Victoria Garcia is here to discuss her progress with her obesity treatment plan along with follow-up of her obesity related diagnoses. Victoria Garcia is on the Category 2 Plan and logging and states she is following her eating plan approximately 70% of the time. Victoria Garcia states she is walking and doing Pilates 45 minutes 6 times per week.  Today's visit was #: 4 Starting weight: 200 lbs Starting date: 10/27/2020 Today's weight: 193 lbs Today's date: 12/16/2020 Total lbs lost to date: 7 Total lbs lost since last in-office visit: 0  Interim History: "I always start strong with a diet, but usually always lose focus." Her husband is the cook and he hasn't been cooking lately. She is not food prepping. She has started back exercising.  Subjective:   1. Other depression, with emotional eating Victoria Garcia went off Paxil and PCP started Prozac in mid June 2022. Pt felt no help with her "hot headedness" at work after 4-5 weeks on meds. She went off Prozac early in July.  2. At risk for deficient intake of food Victoria Garcia is at risk for deficient intake of food.  Assessment/Plan:  No orders of the defined types were placed in this encounter.   Medications Discontinued During This Encounter  Medication Reason   FLUoxetine (PROZAC) 10 MG capsule Non-compliance     Meds ordered this encounter  Medications   buPROPion ER (WELLBUTRIN SR) 100 MG 12 hr tablet    Sig: Take 1 tablet (100 mg total) by mouth daily.    Dispense:  30 tablet    Refill:  0    Ov for rf     1. Other depression, with emotional eating Referral to Dr. Dewaine Conger placed. Pt is to call [facility] for counseling and/or contact PCP for a referral (I gave pt a list of providers today). Provided pt with a trial of Wellbutrin today. Start- buPROPion ER (WELLBUTRIN SR) 100 MG 12 hr tablet; Take 1 tablet (100 mg total) by mouth daily.  Dispense: 30 tablet; Refill: 0  2. At risk for deficient intake of food Victoria Garcia was given  approximately 23 minutes of deficit intake of food prevention counseling today. Victoria Garcia is at risk for eating too few calories based on current food recall. She was encouraged to focus on meeting caloric and protein goals according to her recommended meal plan.    3. Obesity with current BMI of 33.1  Victoria Garcia is currently in the action stage of change. As such, her goal is to continue with weight loss efforts. She has agreed to the Category 2 Plan but journal/log food intake in [employer].   Start journaling. Your goals/your why! Handouts on emotional eating given to pt after extensive counseling.  Exercise goals:  As is  Behavioral modification strategies: increasing lean protein intake, no skipping meals, emotional eating strategies, and planning for success.  Victoria Garcia has agreed to follow-up with our clinic in 2-3 weeks. She was informed of the importance of frequent follow-up visits to maximize her success with intensive lifestyle modifications for her multiple health conditions.   Objective:   Blood pressure 110/74, pulse 85, temperature 98.7 F (37.1 C), height 5\' 4"  (1.626 m), weight 193 lb (87.5 kg), SpO2 98 %. Body mass index is 33.13 kg/m.  General: Cooperative, alert, well developed, in no acute distress. HEENT: Conjunctivae and lids unremarkable. Cardiovascular: Regular rhythm.  Lungs: Normal work of breathing. Neurologic: No focal deficits.   Lab Results  Component Value Date   CREATININE 0.70  08/17/2020   BUN 17 08/17/2020   NA 141 08/17/2020   K 4.2 08/17/2020   CL 104 08/17/2020   CO2 27 08/17/2020   Lab Results  Component Value Date   ALT 23 08/17/2020   AST 18 08/17/2020   ALKPHOS 92 01/13/2017   BILITOT 0.6 08/17/2020   Lab Results  Component Value Date   HGBA1C 5.9 (H) 08/17/2020   HGBA1C 6.1 (H) 03/30/2020   HGBA1C 6.2 (H) 12/16/2019   HGBA1C 6.1 (H) 08/05/2019   HGBA1C 5.9 (H) 02/01/2019   Lab Results  Component Value Date   INSULIN 14.3  10/27/2020   Lab Results  Component Value Date   TSH 0.859 10/27/2020   Lab Results  Component Value Date   CHOL 155 08/17/2020   HDL 38 (L) 08/17/2020   LDLCALC 98 08/17/2020   TRIG 97 08/17/2020   CHOLHDL 4.1 08/17/2020   Lab Results  Component Value Date   VD25OH 54 08/17/2020   VD25OH 58 08/05/2019   VD25OH 42 08/01/2017   Lab Results  Component Value Date   WBC 7.0 08/17/2020   HGB 13.5 08/17/2020   HCT 40.6 08/17/2020   MCV 90.6 08/17/2020   PLT 222 08/17/2020   Lab Results  Component Value Date   IRON 50 09/24/2014    Attestation Statements:   Reviewed by clinician on day of visit: allergies, medications, problem list, medical history, surgical history, family history, social history, and previous encounter notes.  [name], CMA, am acting as transcriptionist for [employer], DO.  I have reviewed the above documentation for accuracy and completeness, and I agree with the above. [name], D.O.  The 21st Century Cures Act was signed into law in 2016 which includes the topic of electronic health records.  This provides immediate access to information in MyChart.  This includes consultation notes, operative notes, office notes, lab results and pathology reports.  If you have any questions about what you read please let us know at your next visit so we can discuss your concerns and take corrective action if need be.  We are right here with you.

## 2020-12-21 NOTE — Progress Notes (Signed)
Office: [phone]  /  Fax: [phone]    Date: January 04, 2021   Appointment Start Time: 11:03am Duration: 33 minutes Provider: Lawerance Garcia, Psy.D. Type of Session: Intake for Individual Therapy  Location of Patient: Home (private location) Location of Provider: Provider's home (private office) Type of Contact: Telepsychological Visit via MyChart Video Visit  Informed Consent: Prior to proceeding with today's appointment, two pieces of identifying information were obtained. In addition, Victoria Garcia's physical location at the time of this appointment was obtained as well a phone number she could be reached at in the event of technical difficulties. Victoria Garcia and this provider participated in today's telepsychological service.   The provider's role was explained to Victoria Garcia. The provider reviewed and discussed issues of confidentiality, privacy, and limits therein (e.g., reporting obligations). In addition to verbal informed consent, written informed consent for psychological services was obtained prior to the initial appointment. Since the clinic is not a 24/7 crisis center, mental health emergency resources were shared and this  provider explained MyChart, e-mail, voicemail, and/or other messaging systems should be utilized only for non-emergency reasons. This provider also explained that information obtained during appointments will be placed in Victoria Garcia's medical record and relevant information will be shared with other providers at [facility] for coordination of care. Victoria Garcia agreed information may be shared with other [facility] providers as needed for coordination of care and by signing the service agreement document, she provided written consent for coordination of care. Prior to initiating telepsychological services, Victoria Garcia completed an informed consent document, which included the development of a safety plan (i.e., an emergency contact and emergency  resources) in the event of an emergency/crisis. Victoria Garcia verbally acknowledged understanding she is ultimately responsible for understanding her insurance benefits for telepsychological and in-person services. This provider also reviewed confidentiality, as it relates to telepsychological services, as well as the rationale for telepsychological services (i.e., to reduce exposure risk to COVID-19). Victoria Garcia  acknowledged understanding that appointments cannot be recorded without both party consent and she is aware she is responsible for securing confidentiality on her end of the session. Victoria Garcia verbally consented to proceed.  Chief Complaint/HPI: Victoria Garcia was referred by Dr. Thomasene Garcia due to other depression, with emotional eating. Per the note for the visit with Dr. Thomasene Garcia on December 16, 2020, "Victoria Garcia went off Paxil and PCP started Prozac in mid June 2022. Pt felt no help with her "hot headedness" at work after 4-5 weeks on meds. She went off Prozac early in July." The note for the initial appointment with Dr. Thomasene Garcia on October 27, 2020 indicated the following: "Her family eats meals together, she thinks her family will eat healthier with her, her desired weight loss is 32 pounds, she has been heavy most of her life, she started gaining weight after childbirth, her heaviest weight ever was 225 pounds, she craves carbs, chips, snack food, popcorn, crackers, candy, and chewing gum, she snacks frequently in the evenings, she skips breakfast frequently, she is frequently drinking liquids with calories, she frequently makes poor food choices, and she struggles with emotional eating." Victoria Garcia's Food and Mood (modified PHQ-9) score on October 27, 2020 was 8.  During today's appointment, Victoria Garcia stated she was initially doing well with her structured meal plan, noting she would journal to identify "patterns." She stated she feels she has "dropped off," adding that "certain parts of the day" she feels she has to "go  to the kitchen to eat." She was verbally  administered a questionnaire assessing various behaviors related to emotional eating behaviors. Victoria Garcia endorsed the following: experience food cravings on a regular basis, eat certain foods when you are anxious, stressed, depressed, or your feelings are hurt, use food to help you cope with emotional situations, find food is comforting to you, overeat frequently when you are bored or lonely, not worry about what you eat when you are in a good mood, overeat when you are angry at someone just to show them they cannot control you, overeat when you are alone, but eat much less when you are with other people, eat to help you stay awake, and eat as a reward. She shared she craves salty and crunchy snacks, as well as sweets. Victoria Garcia believes the onset of emotional eating behaviors was likely in childhood and described the current frequency of emotional eating behaviors as "several days out of the week." In addition, Victoria Garcia denied a history of binge eating behaviors. Victoria Garcia denied a history of restricting food intake, purging and engagement in other compensatory strategies for weight loss, and has never been diagnosed with an eating disorder. She also denied a history of treatment for emotional eating. Furthermore, Victoria Garcia denied other problems of concern.    Mental Status Examination:  Appearance: well groomed and appropriate hygiene  Behavior: appropriate to circumstances Mood: euthymic Affect: mood congruent Speech: normal in rate, volume, and tone Eye Contact: appropriate Psychomotor Activity: unable to assess Gait: unable to assess  Thought Process: linear, logical, and goal directed  Thought Content/Perception: denies suicidal and homicidal ideation, plan, and intent, no hallucinations, delusions, bizarre thinking or behavior reported or observed, and denies ideation and engagement in self-injurious behaviors Orientation: time, person, place, and purpose of  appointment Memory/Concentration: memory, attention, language, and fund of knowledge intact  Insight/Judgment: good  Family & Psychosocial History: Victoria Garcia reported she is married and she has two children (ages 23 and 53). 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 Additionally, Victoria Garcia shared her highest level of education obtained is "some college." Currently, Ayen's social support system consists of her daughter, husband, and best friend. Moreover, Victoria Garcia stated she resides with her husband and children.   Medical History:  Past Medical History:  Diagnosis Date   Allergy    SEASONAL   Anemia    Anemia, iron deficiency 09/24/2014   Back pain    Depression    History of stomach ulcers    Hyperlipidemia    Joint pain    Joint pain    Multiple food allergies    Prediabetes    SOBOE (shortness of breath on exertion)    Vitamin D deficiency    Past Surgical History:  Procedure Laterality Date   BREAST REDUCTION SURGERY  2004   bilat   BREAST SURGERY  2007   BREAST REDUCTION   CESAREAN SECTION  2002   COLONOSCOPY     DILITATION & CURRETTAGE/HYSTROSCOPY WITH HYDROTHERMAL ABLATION N/A 04/08/2016   Procedure: HYSTEROSCOPY , HYDROTHERMAL ABLATION;  Surgeon: Jaymes Graff, MD;  Location: [facility];  Service: Gynecology;  Laterality: N/A;   TONSILLECTOMY     TUBAL LIGATION     VAGINAL DELIVERY  1999   Current Outpatient Medications on File Prior to Visit  Medication Sig Dispense Refill   atorvastatin (LIPITOR) 20 MG tablet TAKE 1 TABLET (20 MG TOTAL) BY MOUTH DAILY. 90 tablet 3   buPROPion ER (WELLBUTRIN SR) 100 MG 12 hr tablet Take 1 tablet (100 mg total) by mouth daily.  30 tablet 0   Cholecalciferol (VITAMIN D) 2000 units tablet Take 1 tablet (2,000 Units total) by mouth daily.     ELDERBERRY PO Take by mouth.     levocetirizine (XYZAL) 5 MG tablet Take 5 mg by mouth every evening.     Multiple Vitamin (MULTIVITAMIN WITH MINERALS) TABS  tablet Take 1 tablet by mouth daily.     Psyllium (METAMUCIL PO) Take by mouth.     Semaglutide, 1 MG/DOSE, 4 MG/3ML SOPN Inject 1 mg into the skin once a week. 9 mL 3   No current facility-administered medications on file prior to visit.  Medication compliant; no concerns.   Mental Health History: Victoria Garcia reported she attended therapeutic services approximately 10-12 years ago to address marital concerns. Currently, she shared Dr. Sharee Holsterpalski prescribed Wellbutrin, noting she was switched from Paxil to Wellbutrin. She indicated she has not experienced change; therefore, she was receptive to discussing concerns with Dr. Sharee Holsterpalski today during their appointment. Victoria Garcia reported there is no history of hospitalizations for psychiatric concerns. Victoria Garcia denied a family history of mental health/substance abuse related concerns. Victoria Garcia reported there is no history of trauma including psychological, physical , and sexual abuse, as well as neglect.   Victoria Garcia described her typical mood lately as "good." Aside from concerns noted above and endorsed on the PHQ-9 and GAD-7, Victoria Garcia reported experiencing challenges with staying on track with certain things (e.g., exercise); negative self-talk; and worry thoughts (finances and well-being of family). Victoria Garcia denied current alcohol use. She denied tobacco use. She denied illicit/recreational substance use. Regarding caffeine intake, Victoria Garcia reported consuming approximately 16oz of coffee daily. Furthermore, Victoria Garcia indicated she is not experiencing the following: hallucinations and delusions, paranoia, symptoms of mania , social withdrawal, crying spells, panic attacks, and obsessions and compulsions. She also denied history of and current suicidal ideation, plan, and intent; history of and current homicidal ideation, plan, and intent; and history of and current engagement in self-harm.  The following strengths were reported by Victoria Garcia: hard worker, desire to have fun, and good  caregiver. The following strengths were observed by this provider: ability to express thoughts and feelings during the therapeutic session, ability to establish and benefit from a therapeutic relationship, willingness to work toward established goal(s) with the clinic and ability to engage in reciprocal conversation.  Legal History: Victoria Garcia reported there is no history of legal involvement.   Structured Assessments Results: The Patient Health Questionnaire-9 (PHQ-9) is a self-report measure that assesses symptoms and severity of depression over the course of the last two weeks. Victoria Garcia obtained a score of 3 suggesting minimal depression. Victoria Garcia finds the endorsed symptoms to be somewhat difficult. [0= Not at all; 1= Several days; 2= More than half the days; 3= Nearly every day] Little interest or pleasure in doing things 0  Feeling down, depressed, or hopeless 0  Trouble falling or staying asleep, or sleeping too much 0  Feeling tired or having little energy 1  Poor appetite or overeating-fluctuations  1  Feeling bad about yourself --- or that you are a failure or have let yourself or your family down 1  Trouble concentrating on things, such as reading the newspaper or watching television 0  Moving or speaking so slowly that other people could have noticed? Or the opposite --- being so fidgety or restless that you have been moving around a Garcia more than usual 0  Thoughts that you would be better off dead or hurting yourself in some way 0  PHQ-9 Score 3  The Generalized Anxiety Disorder-7 (GAD-7) is a brief self-report measure that assesses symptoms of anxiety over the course of the last two weeks. Malaak obtained a score of 5 suggesting mild anxiety. Yanessa finds the endorsed symptoms to be not difficult at all. [0= Not at all; 1= Several days; 2= Over half the days; 3= Nearly every day] Feeling nervous, anxious, on edge 1  Not being able to stop or control worrying 1  Worrying too much about  different things 1  Trouble relaxing 1  Being so restless that it's hard to sit still 0  Becoming easily annoyed or irritable 1  Feeling afraid as if something awful might happen 0  GAD-7 Score 5   Interventions:  Conducted a chart review Focused on rapport building Verbally administered PHQ-9 and GAD-7 for symptom monitoring Verbally administered Food & Mood questionnaire to assess various behaviors related to emotional eating Provided emphatic reflections and validation Collaborated with patient on a treatment goal  Psychoeducation provided regarding physical versus emotional hunger  Provisional DSM-5 Diagnosis(es): F50.89 Other Specified Feeding or Eating Disorder, Emotional Eating Behaviors and 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 The next appointment will be scheduled in approximately two weeks, which will be via MyChart Video Visit. The following treatment goal was established: increase coping skills. This provider will regularly review the treatment plan and medical chart to keep informed of status changes. Katianna expressed understanding and agreement with the initial treatment plan of care. Haila will be sent a handout via e-mail to utilize between now and the next appointment to increase awareness of hunger patterns and subsequent eating. Jayonna provided verbal consent during today's appointment for this provider to send the handout via e-mail.

## 2021-01-04 ENCOUNTER — Other Ambulatory Visit (HOSPITAL_COMMUNITY): Payer: Self-pay

## 2021-01-04 ENCOUNTER — Encounter (INDEPENDENT_AMBULATORY_CARE_PROVIDER_SITE_OTHER): Payer: Self-pay | Admitting: Family Medicine

## 2021-01-04 ENCOUNTER — Ambulatory Visit (INDEPENDENT_AMBULATORY_CARE_PROVIDER_SITE_OTHER): Payer: 59 | Admitting: Family Medicine

## 2021-01-04 ENCOUNTER — Other Ambulatory Visit: Payer: Self-pay

## 2021-01-04 ENCOUNTER — Telehealth (INDEPENDENT_AMBULATORY_CARE_PROVIDER_SITE_OTHER): Payer: 59 | Admitting: Psychology

## 2021-01-04 VITALS — BP 117/77 | HR 82 | Temp 98.1°F | Ht 64.0 in | Wt 190.0 lb

## 2021-01-04 DIAGNOSIS — F3289 Other specified depressive episodes: Secondary | ICD-10-CM

## 2021-01-04 DIAGNOSIS — F5089 Other specified eating disorder: Secondary | ICD-10-CM | POA: Diagnosis not present

## 2021-01-04 DIAGNOSIS — F419 Anxiety disorder, unspecified: Secondary | ICD-10-CM

## 2021-01-04 DIAGNOSIS — R7303 Prediabetes: Secondary | ICD-10-CM | POA: Diagnosis not present

## 2021-01-04 DIAGNOSIS — Z9189 Other specified personal risk factors, not elsewhere classified: Secondary | ICD-10-CM

## 2021-01-04 DIAGNOSIS — Z6834 Body mass index (BMI) 34.0-34.9, adult: Secondary | ICD-10-CM | POA: Diagnosis not present

## 2021-01-04 DIAGNOSIS — E669 Obesity, unspecified: Secondary | ICD-10-CM

## 2021-01-04 MED ORDER — BUPROPION HCL ER (SR) 100 MG PO TB12
100.0000 mg | ORAL_TABLET | Freq: Every day | ORAL | 0 refills | Status: DC
  Filled 2021-01-04 – 2021-01-11 (×2): qty 30, 30d supply, fill #0

## 2021-01-05 LAB — HEMOGLOBIN A1C
Est. average glucose Bld gHb Est-mCnc: 126 mg/dL
Hgb A1c MFr Bld: 6 % — ABNORMAL HIGH (ref 4.8–5.6)

## 2021-01-05 NOTE — Progress Notes (Signed)
Chief Complaint:   OBESITY Victoria Garcia is here to discuss her progress with her obesity treatment plan along with follow-up of her obesity related diagnoses. Victoria Garcia is on the Category 2 Plan and states she is following her eating plan approximately 80% of the time. Victoria Garcia states she is walking and doing Pilates at the gym 60 minutes 3 times per week.  Today's visit was #: 5 Starting weight: 200 lbs Starting date: 10/27/2020 Today's weight: 190 lbs Today's date: 01/04/2021 Total lbs lost to date: 10 Total lbs lost since last in-office visit: 3  Interim History: Victoria Garcia did better with meal prep and planning. She is eating out less. Victoria Garcia is here for a follow up office visit.  We reviewed her meal plan and questions were answered.  Patient's food recall appears to be accurate and consistent with what is on plan when she is following it.   When eating on plan, her hunger and cravings are well controlled.    Subjective:   1. Pre-diabetes Not at goal. Goal is HgbA1c < 5.7.  Medication: Ozempic.    Lab Results  Component Value Date   HGBA1C 6.0 (H) 01/04/2021   Lab Results  Component Value Date   INSULIN 14.3 10/27/2020   2. Other depression, with emotional eating Tamiki met with Dr. Mallie Mussel this morning and reports it went well. She has EAP contact information but hasn't called yet. She started Wellbutrin with no side effects.  Assessment/Plan:   Medications Discontinued During This Encounter  Medication Reason   Psyllium (METAMUCIL PO) Error   buPROPion ER (WELLBUTRIN SR) 100 MG 12 hr tablet Reorder     Meds ordered this encounter  Medications   DISCONTD: buPROPion ER (WELLBUTRIN SR) 100 MG 12 hr tablet    Sig: Take 1 tablet (100 mg total) by mouth daily.    Dispense:  30 tablet    Refill:  0    Ov for rf     1. Pre-diabetes She will continue to focus on protein-rich, low simple carbohydrate foods. We reviewed the importance of hydration, regular exercise for  stress reduction, and restorative sleep. Continue Ozempic and check A1c today.  2. Other depression, with emotional eating Behavior modification techniques were discussed today to help Victoria Garcia deal with her emotional/non-hunger eating behaviors.  Orders and follow up as documented in patient record.   Refill- buPROPion ER (WELLBUTRIN SR) 100 MG 12 hr tablet; Take 1 tablet (100 mg total) by mouth daily.  Dispense: 30 tablet; Refill: 0  3. At risk for diabetes mellitus - Victoria Garcia was given diabetes prevention education and counseling today of more than 15 minutes.  - Counseled patient on pathophysiology of disease and meaning/ implication of lab results.  - Reviewed how certain foods can either stimulate or inhibit insulin release, and subsequently affect hunger pathways  - Importance of following a healthy meal plan with limiting amounts of simple carbohydrates discussed with patient - Effects of regular aerobic exercise on blood sugar regulation reviewed and encouraged an eventual goal of 30 min 5d/week or more as a minimum.  - Briefly discussed treatment options, which always include dietary and lifestyle modification as first line.   - Handouts provided at patient's desire and/or told to go online to the American Diabetes Association website for further information.  4. Obesity with current BMI of 32.8  Blue is currently in the action stage of change. As such, her goal is to continue with weight loss efforts. She has agreed  to the Category 2 Plan.   Exercise goals:  As is  Behavioral modification strategies: keeping healthy foods in the home and emotional eating strategies.  Korina has agreed to follow-up with our clinic in 2-3 weeks. She was informed of the importance of frequent follow-up visits to maximize her success with intensive lifestyle modifications for her multiple health conditions.   Arbor was informed we would discuss her lab results at her next visit unless there is a  critical issue that needs to be addressed sooner. Victoria Garcia agreed to keep her next visit at the agreed upon time to discuss these results.  Objective:   Blood pressure 117/77, pulse 82, temperature 98.1 F (36.7 C), height $RemoveBe'5\' 4"'NWhKgghdU$  (1.626 m), weight 190 lb (86.2 kg), SpO2 99 %. Body mass index is 32.61 kg/m.  General: Cooperative, alert, well developed, in no acute distress. HEENT: Conjunctivae and lids unremarkable. Cardiovascular: Regular rhythm.  Lungs: Normal work of breathing. Neurologic: No focal deficits.   Lab Results  Component Value Date   CREATININE 0.70 08/17/2020   BUN 17 08/17/2020   NA 141 08/17/2020   K 4.2 08/17/2020   CL 104 08/17/2020   CO2 27 08/17/2020   Lab Results  Component Value Date   ALT 23 08/17/2020   AST 18 08/17/2020   ALKPHOS 92 01/13/2017   BILITOT 0.6 08/17/2020   Lab Results  Component Value Date   HGBA1C 6.0 (H) 01/04/2021   HGBA1C 5.9 (H) 08/17/2020   HGBA1C 6.1 (H) 03/30/2020   HGBA1C 6.2 (H) 12/16/2019   HGBA1C 6.1 (H) 08/05/2019   Lab Results  Component Value Date   INSULIN 14.3 10/27/2020   Lab Results  Component Value Date   TSH 0.859 10/27/2020   Lab Results  Component Value Date   CHOL 155 08/17/2020   HDL 38 (L) 08/17/2020   LDLCALC 98 08/17/2020   TRIG 97 08/17/2020   CHOLHDL 4.1 08/17/2020   Lab Results  Component Value Date   VD25OH 54 08/17/2020   VD25OH 58 08/05/2019   VD25OH 42 08/01/2017   Lab Results  Component Value Date   WBC 7.0 08/17/2020   HGB 13.5 08/17/2020   HCT 40.6 08/17/2020   MCV 90.6 08/17/2020   PLT 222 08/17/2020   Lab Results  Component Value Date   IRON 50 09/24/2014    Attestation Statements:   Reviewed by clinician on day of visit: allergies, medications, problem list, medical history, surgical history, family history, social history, and previous encounter notes.  Coral Ceo, CMA, am acting as transcriptionist for [employer], DO.  I have reviewed the above  documentation for accuracy and completeness, and I agree with the above. Marjory Sneddon, D.O.  The [place] was signed into law in 2016 which includes the topic of electronic health records.  This provides immediate access to information in MyChart.  This includes consultation notes, operative notes, office notes, lab results and pathology reports.  If you have any questions about what you read please let us know at your next visit so we can discuss your concerns and take corrective action if need be.  We are right here with you.

## 2021-01-05 NOTE — Progress Notes (Unsigned)
  Office: [phone]  /  Fax: [phone]    Date: January 19, 2021   Appointment Start Time: *** Duration: *** minutes Provider: Lawerance Cruel, Psy.D. Type of Session: Individual Therapy  Location of Patient: {gbptloc:23249} Location of Provider: Provider's Home (private office) Type of Contact: Telepsychological Visit via {MyChart Video Visit  Session Content: This provider called Victoria Garcia at 11:03am as she did not present for today's appointment. A HIPAA compliant voicemail was left requesting a call back.   As such, today's appointment was initiated *** minutes late. Victoria Garcia is a 60 y.o. female presenting for a follow-up appointment to address the previously established treatment goal of increasing coping skills. Today's appointment was a telepsychological visit due to COVID-19. Victoria Garcia provided verbal consent for today's telepsychological appointment and she is aware she is responsible for securing confidentiality on her end of the session. Prior to proceeding with today's appointment, Victoria Garcia's physical location at the time of this appointment was obtained as well a phone number she could be reached at in the event of technical difficulties. Victoria Garcia and this provider participated in today's telepsychological service.   This provider conducted a brief check-in. *** Victoria Garcia was receptive to today's appointment as evidenced by openness to sharing, responsiveness to feedback, and {gbreceptiveness:23401}.  Mental Status Examination:  Appearance: {Appearance:22431} Behavior: {Behavior:22445} Mood: {gbmood:21757} Affect: {Affect:22436} Speech: {Speech:22432} Eye Contact: {Eye Contact:22433} Psychomotor Activity: {Motor Activity:22434} Gait: {gbgait:23404} Thought Process: {thought process:22448}  Thought Content/Perception: {disturbances:22451} Orientation: {Orientation:22437} Memory/Concentration: {gbcognition:22449} Insight/Judgment: {Insight:22446}  Interventions:  {Interventions for  Progress Notes:23405}  DSM-5 Diagnosis(es): F50.89 Other Specified Feeding or Eating Disorder, Emotional Eating Behaviors and F41.9 Unspecified Anxiety Disorder  Treatment Goal & Progress: During the initial appointment with this provider, the following treatment goal was established: increase coping skills. Victoria Garcia has demonstrated progress in her goal as evidenced by {gbtxprogress:22839}. Victoria Garcia also {gbtxprogress2:22951}.  Plan: The next appointment will be scheduled in {gbweeks:21758}, which will be {gbtxmodality:23402}. The next session will focus on {Plan for Next Appointment:23400}.

## 2021-01-11 ENCOUNTER — Other Ambulatory Visit (HOSPITAL_COMMUNITY): Payer: Self-pay

## 2021-01-11 MED ORDER — QUICKVUE AT-HOME COVID-19 TEST VI KIT
PACK | 0 refills | Status: DC
  Filled 2021-01-11: qty 2, 2d supply, fill #0

## 2021-01-12 ENCOUNTER — Other Ambulatory Visit (HOSPITAL_COMMUNITY): Payer: Self-pay

## 2021-01-19 ENCOUNTER — Telehealth (INDEPENDENT_AMBULATORY_CARE_PROVIDER_SITE_OTHER): Payer: Self-pay | Admitting: Psychology

## 2021-01-19 ENCOUNTER — Encounter (INDEPENDENT_AMBULATORY_CARE_PROVIDER_SITE_OTHER): Payer: Self-pay

## 2021-01-19 ENCOUNTER — Telehealth (INDEPENDENT_AMBULATORY_CARE_PROVIDER_SITE_OTHER): Payer: 59 | Admitting: Psychology

## 2021-01-19 NOTE — Telephone Encounter (Signed)
  Office: [phone]  /  Fax: [phone]  Date of Call: January 19, 2021  Time of Call: 11:03am Provider: Lawerance Cruel, PsyD  CONTENT: This provider called Victoria Garcia to check-in as she did not present for today's MyChart Video Visit appointment at 11:00am. A HIPAA compliant voicemail was left requesting a call back. Of note, this provider stayed on the MyChart Video Visit appointment for 5 minutes prior to signing off per the clinic's grace period policy.    PLAN: This provider will wait for Victoria Garcia to call back. No further follow-up planned by this provider.

## 2021-01-20 ENCOUNTER — Other Ambulatory Visit: Payer: Self-pay

## 2021-01-20 ENCOUNTER — Encounter (INDEPENDENT_AMBULATORY_CARE_PROVIDER_SITE_OTHER): Payer: Self-pay | Admitting: Family Medicine

## 2021-01-20 ENCOUNTER — Other Ambulatory Visit (HOSPITAL_COMMUNITY): Payer: Self-pay

## 2021-01-20 ENCOUNTER — Ambulatory Visit (INDEPENDENT_AMBULATORY_CARE_PROVIDER_SITE_OTHER): Payer: 59 | Admitting: Family Medicine

## 2021-01-20 VITALS — BP 118/75 | HR 80 | Temp 98.6°F | Ht 64.0 in | Wt 190.0 lb

## 2021-01-20 DIAGNOSIS — Z9189 Other specified personal risk factors, not elsewhere classified: Secondary | ICD-10-CM

## 2021-01-20 DIAGNOSIS — E669 Obesity, unspecified: Secondary | ICD-10-CM

## 2021-01-20 DIAGNOSIS — R7303 Prediabetes: Secondary | ICD-10-CM | POA: Diagnosis not present

## 2021-01-20 DIAGNOSIS — F3289 Other specified depressive episodes: Secondary | ICD-10-CM

## 2021-01-20 DIAGNOSIS — Z6834 Body mass index (BMI) 34.0-34.9, adult: Secondary | ICD-10-CM | POA: Diagnosis not present

## 2021-01-20 MED ORDER — SEMAGLUTIDE (1 MG/DOSE) 4 MG/3ML ~~LOC~~ SOPN
1.0000 mg | PEN_INJECTOR | SUBCUTANEOUS | 0 refills | Status: DC
  Filled 2021-01-20: qty 3, 28d supply, fill #0

## 2021-01-20 MED ORDER — BUPROPION HCL ER (SR) 100 MG PO TB12
100.0000 mg | ORAL_TABLET | Freq: Every day | ORAL | 0 refills | Status: DC
  Filled 2021-01-20: qty 30, 30d supply, fill #0

## 2021-01-20 NOTE — Progress Notes (Signed)
Chief Complaint:   OBESITY Victoria Garcia is here to discuss her progress with her obesity treatment plan along with follow-up of her obesity related diagnoses. Victoria Garcia is on the Category 2 Plan and states she is following her eating plan approximately 70% of the time. Victoria Garcia states she is walking and doing pilates 2 times per week.  Today's visit was #: 6 Starting weight: 200 lbs Starting date: 10/27/2020 Today's weight: 190 lbs Today's date: 01/20/2021 Total lbs lost to date: 10 Total lbs lost since last in-office visit: 0  Interim History: Manisha had birthday celebrations recently, and she is glad that she didn't gain weight. Victoria Garcia is here for a follow up office visit.  We reviewed her meal plan and questions were answered.  Patient's food recall appears to be accurate and consistent with what is on plan when she is following it.   When eating on plan, her hunger and cravings are well controlled.    Subjective:   1. Pre-diabetes Victoria Garcia notes Semaglutide is working well, and she has no issues or concerns. She notes it helps control cravings and hunger for snacks.  2. Other depression, with emotional eating Victoria Garcia met with Dr. Mallie Mussel, which she notes is working well, and her medication is helping her a lot. She has better energy, more positive outlook, and she is more focused. Her mood has improved.  3. At risk for diabetes mellitus Victoria Garcia is at higher than average risk for developing diabetes due to pre-diabetes.   Assessment/Plan:  No orders of the defined types were placed in this encounter.   Medications Discontinued During This Encounter  Medication Reason   Semaglutide, 1 MG/DOSE, 4 MG/3ML SOPN Reorder   buPROPion ER (WELLBUTRIN SR) 100 MG 12 hr tablet Reorder     Meds ordered this encounter  Medications   buPROPion ER (WELLBUTRIN SR) 100 MG 12 hr tablet    Sig: Take 1 tablet (100 mg total) by mouth daily.    Dispense:  30 tablet    Refill:  0    Ov for rf    Semaglutide, 1 MG/DOSE, 4 MG/3ML SOPN    Sig: Inject 1 mg into the skin once a week.    Dispense:  3 mL    Refill:  0    30 d supply; ov for rf     1. Pre-diabetes Victoria Garcia will continue her medications, and we will refill Semaglutide 1 mg q weekly for 1 month.  - I reiterated and again counseled patient on pathophysiology of the disease process of  Pre-DM.   - Stressed importance of dietary and lifestyle modifications resulting in weight loss as first line txmnt - in addition we discussed the risks and benefits of various medication options which can help [name] in the management of this disease process as well as with weight loss.  Will consider starting one of these meds in future as will focus on prudent nutritional plan at this time.  - continue to decrease simple carbs; increase fiber and proteins -> follow meal plan  - handouts provided at pt's request after education provided.  All concerns/questions addressed.   - anticipatory guidance given.   - Recheck A1c and fasting insulin level in approximately 3 months from last check or as deemed fit.   - Semaglutide, 1 MG/DOSE, 4 MG/3ML SOPN; Inject 1 mg into the skin once a week.  Dispense: 3 mL; Refill: 0  2. Other depression, with emotional eating Behavior modification techniques were discussed  today to help Victoria Garcia deal with her emotional/non-hunger eating behaviors. We will refill Wellbutrin SR for 1 month. Orders and follow up as documented in patient record.   - buPROPion ER (WELLBUTRIN SR) 100 MG 12 hr tablet; Take 1 tablet (100 mg total) by mouth daily.  Dispense: 30 tablet; Refill: 0  3. At risk for diabetes mellitus - Victoria Garcia was given diabetes prevention education and counseling today of more than 8 minutes.  - Counseled patient on pathophysiology of disease and meaning/ implication of lab results.  - Reviewed how certain foods can either stimulate or inhibit insulin release, and subsequently affect hunger pathways  - Importance of  following a healthy meal plan with limiting amounts of simple carbohydrates discussed with patient - Effects of regular aerobic exercise on blood sugar regulation reviewed and encouraged an eventual goal of 30 min 5d/week or more as a minimum.  - Briefly discussed treatment options, which always include dietary and lifestyle modification as first line.   - Handouts provided at patient's desire and/or told to go online to the American Diabetes Association website for further information.  4. Obesity with current BMI of 32.7 Victoria Garcia is currently in the action stage of change. As such, her goal is to continue with weight loss efforts. She has agreed to the Category 2 Plan.   Exercise goals: As is.  Behavioral modification strategies: increasing lean protein intake and emotional eating strategies.  Victoria Garcia has agreed to follow-up with our clinic in 2 to 3 weeks. She was informed of the importance of frequent follow-up visits to maximize her success with intensive lifestyle modifications for her multiple health conditions.   Objective:   Blood pressure 118/75, pulse 80, temperature 98.6 F (37 C), height $RemoveBe'5\' 4"'wkjKGItVF$  (1.626 m), weight 190 lb (86.2 kg), SpO2 96 %. Body mass index is 32.61 kg/m.  General: Cooperative, alert, well developed, in no acute distress. HEENT: Conjunctivae and lids unremarkable. Cardiovascular: Regular rhythm.  Lungs: Normal work of breathing. Neurologic: No focal deficits.   Lab Results  Component Value Date   CREATININE 0.70 08/17/2020   BUN 17 08/17/2020   NA 141 08/17/2020   K 4.2 08/17/2020   CL 104 08/17/2020   CO2 27 08/17/2020   Lab Results  Component Value Date   ALT 23 08/17/2020   AST 18 08/17/2020   ALKPHOS 92 01/13/2017   BILITOT 0.6 08/17/2020   Lab Results  Component Value Date   HGBA1C 6.0 (H) 01/04/2021   HGBA1C 5.9 (H) 08/17/2020   HGBA1C 6.1 (H) 03/30/2020   HGBA1C 6.2 (H) 12/16/2019   HGBA1C 6.1 (H) 08/05/2019   Lab Results  Component  Value Date   INSULIN 14.3 10/27/2020   Lab Results  Component Value Date   TSH 0.859 10/27/2020   Lab Results  Component Value Date   CHOL 155 08/17/2020   HDL 38 (L) 08/17/2020   LDLCALC 98 08/17/2020   TRIG 97 08/17/2020   CHOLHDL 4.1 08/17/2020   Lab Results  Component Value Date   VD25OH 54 08/17/2020   VD25OH 58 08/05/2019   VD25OH 42 08/01/2017   Lab Results  Component Value Date   WBC 7.0 08/17/2020   HGB 13.5 08/17/2020   HCT 40.6 08/17/2020   MCV 90.6 08/17/2020   PLT 222 08/17/2020   Lab Results  Component Value Date   IRON 50 09/24/2014   Attestation Statements:   Reviewed by clinician on day of visit: allergies, medications, problem list, medical history, surgical history, family history,  social history, and previous encounter notes.   Wilhemena Durie, am acting as transcriptionist for [employer], DO.  I have reviewed the above documentation for accuracy and completeness, and I agree with the above. Marjory Sneddon, D.O.  The [place] was signed into law in 2016 which includes the topic of electronic health records.  This provides immediate access to information in MyChart.  This includes consultation notes, operative notes, office notes, lab results and pathology reports.  If you have any questions about what you read please let us know at your next visit so we can discuss your concerns and take corrective action if need be.  We are right here with you.

## 2021-02-08 ENCOUNTER — Encounter (INDEPENDENT_AMBULATORY_CARE_PROVIDER_SITE_OTHER): Payer: Self-pay | Admitting: Family Medicine

## 2021-02-08 ENCOUNTER — Ambulatory Visit (INDEPENDENT_AMBULATORY_CARE_PROVIDER_SITE_OTHER): Payer: 59 | Admitting: Family Medicine

## 2021-02-08 ENCOUNTER — Other Ambulatory Visit: Payer: Self-pay

## 2021-02-08 ENCOUNTER — Other Ambulatory Visit (HOSPITAL_COMMUNITY): Payer: Self-pay

## 2021-02-08 VITALS — BP 118/77 | HR 83 | Temp 97.6°F | Ht 64.0 in | Wt 189.0 lb

## 2021-02-08 DIAGNOSIS — R7303 Prediabetes: Secondary | ICD-10-CM | POA: Diagnosis not present

## 2021-02-08 DIAGNOSIS — E669 Obesity, unspecified: Secondary | ICD-10-CM | POA: Diagnosis not present

## 2021-02-08 DIAGNOSIS — F3289 Other specified depressive episodes: Secondary | ICD-10-CM

## 2021-02-08 DIAGNOSIS — Z6834 Body mass index (BMI) 34.0-34.9, adult: Secondary | ICD-10-CM

## 2021-02-08 DIAGNOSIS — Z9189 Other specified personal risk factors, not elsewhere classified: Secondary | ICD-10-CM

## 2021-02-08 MED ORDER — BUPROPION HCL ER (SR) 100 MG PO TB12
100.0000 mg | ORAL_TABLET | Freq: Two times a day (BID) | ORAL | 0 refills | Status: DC
Start: 2021-02-08 — End: 2021-02-24
  Filled 2021-02-08: qty 60, 30d supply, fill #0

## 2021-02-08 MED ORDER — SEMAGLUTIDE (1 MG/DOSE) 4 MG/3ML ~~LOC~~ SOPN
1.0000 mg | PEN_INJECTOR | SUBCUTANEOUS | 0 refills | Status: DC
  Filled 2021-02-08: qty 3, 28d supply, fill #0

## 2021-02-08 NOTE — Progress Notes (Signed)
Chief Complaint:   OBESITY Victoria Garcia is here to discuss her progress with her obesity treatment plan along with follow-up of her obesity related diagnoses. Victoria Garcia is on the Category 2 Plan and states she is following her eating plan approximately 60% of the time. Victoria Garcia states she is walking 60 minutes 5 times per week.  Today's visit was #: 7 Starting weight: 200 lbs Starting date: 10/27/2020 Today's weight: 189 lbs Today's date: 02/08/2021 Total lbs lost to date: 11 Total lbs lost since last in-office visit: 1  Interim History: Victoria Garcia was on vacation at [facility] for a week and had an anniversary with her husband. She was moving more every day, which she attributes to her losing a pound. She denies issues with plan.  Subjective:   1. Other depression, with emotional eating One day pt took Wellbutrin twice daily.  2. Pre-diabetes Victoria Garcia has a diagnosis of prediabetes based on her elevated HgA1c and was informed this puts her at greater risk of developing diabetes. She continues to work on diet and exercise to decrease her risk of diabetes. She denies nausea or hypoglycemia.  Lab Results  Component Value Date   HGBA1C 6.0 (H) 01/04/2021   Lab Results  Component Value Date   INSULIN 14.3 10/27/2020   3. At risk for side effect of medication Victoria Garcia is at risk for side effects of medication due to increasing dose of Wellbutrin. Risks and benefits discussed with patient.  Assessment/Plan:  No orders of the defined types were placed in this encounter.   Medications Discontinued During This Encounter  Medication Reason   buPROPion ER (WELLBUTRIN SR) 100 MG 12 hr tablet Reorder   Semaglutide, 1 MG/DOSE, 4 MG/3ML SOPN Reorder     Meds ordered this encounter  Medications   buPROPion ER (WELLBUTRIN SR) 100 MG 12 hr tablet    Sig: Take 1 tablet (100 mg total) by mouth 2 (two) times daily.    Dispense:  60 tablet    Refill:  0    Ov for rf   Semaglutide, 1 MG/DOSE, 4 MG/3ML  SOPN    Sig: Inject 1 mg into the skin once a week.    Dispense:  3 mL    Refill:  0    30 d supply; ov for rf     1. Other depression, with emotional eating Increase Wellbutrin to BID from QD. Behavior modification techniques were discussed today to help Victoria Garcia deal with her emotional/non-hunger eating behaviors.  Orders and follow up as documented in patient record.   Increase and Refill- buPROPion ER (WELLBUTRIN SR) 100 MG 12 hr tablet; Take 1 tablet (100 mg total) by mouth 2 (two) times daily.  Dispense: 60 tablet; Refill: 0  2. Pre-diabetes Victoria Garcia will continue to work on weight loss, exercise, and decreasing simple carbohydrates to help decrease the risk of diabetes.   Refill- Semaglutide, 1 MG/DOSE, 4 MG/3ML SOPN; Inject 1 mg into the skin once a week.  Dispense: 3 mL; Refill: 0  3. At risk for side effect of medication Victoria Garcia was given approximately 9 minutes of drug side effect counseling today.  We discussed side effect possibility and risk versus benefits. Victoria Garcia agreed to the medication and will contact this office if these side effects are intolerable.  Repetitive spaced learning was employed today to elicit superior memory formation and behavioral change.  4. Obesity with current BMI of 32.5  Victoria Garcia is currently in the action stage of change. As such, her goal  is to continue with weight loss efforts. She has agreed to the Category 2 Plan.   Exercise goals:  As is  Behavioral modification strategies: planning for success.  Victoria Garcia has agreed to follow-up with our clinic in 2-3 weeks. She was informed of the importance of frequent follow-up visits to maximize her success with intensive lifestyle modifications for her multiple health conditions.   Objective:   Blood pressure 118/77, pulse 83, temperature 97.6 F (36.4 C), height 5\' 4"  (1.626 m), weight 189 lb (85.7 kg), SpO2 100 %. Body mass index is 32.44 kg/m.  General: Cooperative, alert, well developed, in no  acute distress. HEENT: Conjunctivae and lids unremarkable. Cardiovascular: Regular rhythm.  Lungs: Normal work of breathing. Neurologic: No focal deficits.   Lab Results  Component Value Date   CREATININE 0.70 08/17/2020   BUN 17 08/17/2020   NA 141 08/17/2020   K 4.2 08/17/2020   CL 104 08/17/2020   CO2 27 08/17/2020   Lab Results  Component Value Date   ALT 23 08/17/2020   AST 18 08/17/2020   ALKPHOS 92 01/13/2017   BILITOT 0.6 08/17/2020   Lab Results  Component Value Date   HGBA1C 6.0 (H) 01/04/2021   HGBA1C 5.9 (H) 08/17/2020   HGBA1C 6.1 (H) 03/30/2020   HGBA1C 6.2 (H) 12/16/2019   HGBA1C 6.1 (H) 08/05/2019   Lab Results  Component Value Date   INSULIN 14.3 10/27/2020   Lab Results  Component Value Date   TSH 0.859 10/27/2020   Lab Results  Component Value Date   CHOL 155 08/17/2020   HDL 38 (L) 08/17/2020   LDLCALC 98 08/17/2020   TRIG 97 08/17/2020   CHOLHDL 4.1 08/17/2020   Lab Results  Component Value Date   VD25OH 54 08/17/2020   VD25OH 58 08/05/2019   VD25OH 42 08/01/2017   Lab Results  Component Value Date   WBC 7.0 08/17/2020   HGB 13.5 08/17/2020   HCT 40.6 08/17/2020   MCV 90.6 08/17/2020   PLT 222 08/17/2020   Lab Results  Component Value Date   IRON 50 09/24/2014   Attestation Statements:   Reviewed by clinician on day of visit: allergies, medications, problem list, medical history, surgical history, family history, social history, and previous encounter notes.  11/24/2014, CMA, am acting as transcriptionist for [name], DO.  I have reviewed the above documentation for accuracy and completeness, and I agree with the above. [employer], D.O.  The 21st Century Cures Act was signed into law in 2016 which includes the topic of electronic health records.  This provides immediate access to information in MyChart.  This includes consultation notes, operative notes, office notes, lab results and pathology  reports.  If you have any questions about what you read please let 2017 know at your next visit so we can discuss your concerns and take corrective action if need be.  We are right here with you.

## 2021-02-09 ENCOUNTER — Other Ambulatory Visit (HOSPITAL_COMMUNITY): Payer: Self-pay

## 2021-02-10 ENCOUNTER — Other Ambulatory Visit (HOSPITAL_COMMUNITY): Payer: Self-pay

## 2021-02-11 ENCOUNTER — Other Ambulatory Visit (HOSPITAL_COMMUNITY): Payer: Self-pay

## 2021-02-16 NOTE — Progress Notes (Signed)
Subjective:    [name], female    DOB: 12-Nov-1960, 61 y.o.   MRN: [id number]  HPI: Victoria Garcia is a 60 y.o. female presenting for follow up.  Chief Complaint  Patient presents with   Hyperlipidemia    F/u   HYPERLIPIDEMIA Started atorvastatin 20 mg daily after last visit.  She is taking this medication every night and tolerating it well.  No new muscle pain. Hyperlipidemia status: excellent compliance Satisfied with current treatment?  yes Side effects:  no Medication compliance: excellent compliance Aspirin:  no The 10-year ASCVD risk score (Arnett DK, et al., 2019) is: 9.2%   Values used to calculate the score:     Age: 80 years     Sex: Female     Is Non-Hispanic African American: Yes     Diabetic: Yes     Tobacco smoker: No     Systolic Blood Pressure: 120 mmHg     Is BP treated: No     HDL Cholesterol: 38 mg/dL     Total Cholesterol: 155 mg/dL Chest pain:  no  MENOPAUSAL SYMPTOMS Recently switched Prozac to Wellbutrin 100 mg twice daily and reports she feels more energy toward the end of the day, does not feel as easily annoyed.  Menopausal symptoms are much better as well.    She is follow [facility] help with weight loss and reports she has met her goal of weight loss. She is very pleased with her weight loss and the great help of the specialists.   Also reports she had COVID-19 back in June.  Reports occasionally, she will have a very strong cigarette smell come over her that makes her nauseous and she cannot finish her meal because of the strong smell.  Has tried nasal rinses.  Seems to be getting better.  Allergies  Allergen Reactions   Contrast Media [Iodinated Diagnostic Agents] Anaphylaxis   Shellfish Allergy Anaphylaxis   Penicillins Rash and Other (See Comments)    Has patient had a PCN reaction causing immediate rash, facial/tongue/throat swelling, SOB or lightheadedness with hypotension: No Has  patient had a PCN reaction causing severe rash involving mucus membranes or skin necrosis: No Has patient had a PCN reaction that required hospitalization No Has patient had a PCN reaction occurring within the last 10 years: No If all of the above answers are "NO", then may proceed with Cephalosporin use.   Sulfa Antibiotics Rash    Outpatient Encounter Medications as of 02/17/2021  Medication Sig   atorvastatin (LIPITOR) 20 MG tablet TAKE 1 TABLET (20 MG TOTAL) BY MOUTH DAILY.   buPROPion ER (WELLBUTRIN SR) 100 MG 12 hr tablet Take 1 tablet (100 mg total) by mouth 2 (two) times daily.   Cholecalciferol (VITAMIN D) 2000 units tablet Take 1 tablet (2,000 Units total) by mouth daily.   levocetirizine (XYZAL) 5 MG tablet Take 5 mg by mouth every evening.   Multiple Vitamin (MULTIVITAMIN WITH MINERALS) TABS tablet Take 1 tablet by mouth daily.   Semaglutide, 1 MG/DOSE, 4 MG/3ML SOPN Inject 1 mg into the skin once a week.   [DISCONTINUED] COVID-19 At Home Antigen Test (QUICKVUE AT-HOME COVID-19 TEST) KIT Use as directed.   [DISCONTINUED] ELDERBERRY PO Take by mouth.   No facility-administered encounter medications on file as of 02/17/2021.    Patient Active Problem List   Diagnosis Date Noted   Other fatigue 10/27/2020   SOBOE (shortness of breath on  exertion) 10/27/2020   Prediabetes 10/27/2020   Depression 10/27/2020   At risk for diabetes mellitus 10/27/2020   GAD (generalized anxiety disorder) 04/23/2018   Hyperlipidemia 01/13/2017   DDD (degenerative disc disease), lumbar 06/20/2016   Knee pain, bilateral 06/20/2016   Vitamin D deficiency 06/20/2016   Menopausal symptoms 06/20/2016   Glucose intolerance (impaired glucose tolerance) 09/24/2014   PMDD (premenstrual dysphoric disorder) 05/07/2014   Class 2 obesity 05/07/2014   Allergies 05/07/2014    Past Medical History:  Diagnosis Date   Allergy    SEASONAL   Anemia    Anemia, iron deficiency 09/24/2014   Back pain     Depression    History of stomach ulcers    Hyperlipidemia    Joint pain    Joint pain    Multiple food allergies    Prediabetes    SOBOE (shortness of breath on exertion)    Vitamin D deficiency     Relevant past medical, surgical, family and social history reviewed and updated as indicated. Interim medical history since our last visit reviewed.  Review of Systems Per HPI unless specifically indicated above     Objective:    BP 120/78   Pulse 80   Temp 98.3 F (36.8 C) (Oral)   Ht $R'5\' 5"'mb$  (1.651 m)   Wt 192 lb 3.2 oz (87.2 kg)   SpO2 97%   BMI 31.98 kg/m   Wt Readings from Last 3 Encounters:  02/17/21 192 lb 3.2 oz (87.2 kg)  02/08/21 189 lb (85.7 kg)  01/20/21 190 lb (86.2 kg)    Physical Exam Vitals and nursing note reviewed.  Constitutional:      General: She is not in acute distress.    Appearance: Normal appearance. She is not toxic-appearing.  Eyes:     General: No scleral icterus.    Extraocular Movements: Extraocular movements intact.  Neck:     Vascular: No carotid bruit.  Cardiovascular:     Rate and Rhythm: Normal rate and regular rhythm.     Heart sounds: Normal heart sounds. No murmur heard. Pulmonary:     Effort: Pulmonary effort is normal. No respiratory distress.     Breath sounds: Normal breath sounds. No wheezing, rhonchi or rales.  Musculoskeletal:     Cervical back: Normal range of motion.  Skin:    General: Skin is warm and dry.     Capillary Refill: Capillary refill takes less than 2 seconds.     Coloration: Skin is not jaundiced or pale.     Findings: No bruising.  Neurological:     Mental Status: She is alert and oriented to person, place, and time.     Motor: No weakness.     Gait: Gait normal.  Psychiatric:        Mood and Affect: Mood normal.        Behavior: Behavior normal.        Thought Content: Thought content normal.        Judgment: Judgment normal.      Assessment & Plan:   Problem List Items Addressed This Visit        Other   PMDD (premenstrual dysphoric disorder) - Primary    Chronic, improved with Wellbutrin 100 mg twice daily.  Will plan to continue collaboration with medical weight management. Follow up in 6 months.       Hyperlipidemia    Chronic.  Will recheck lipids today with electrolytes and kidney function - patient is fasting.  Continue atorvastatin 20 mg daily.  LDL goal is less than 100.  Follow up in 6 months for CPE.      Relevant Orders   Lipid panel   COMPLETE METABOLIC PANEL WITH GFR   Other Visit Diagnoses     Dysosmia       Reassurance given; should slowly continue to improve over time. Discussed that this can be long-term COVID symptom.        Follow up plan: Return in about 6 months (around 08/17/2021) for CPE with fasting labs.

## 2021-02-17 ENCOUNTER — Ambulatory Visit: Payer: 59 | Admitting: Nurse Practitioner

## 2021-02-17 ENCOUNTER — Other Ambulatory Visit: Payer: Self-pay

## 2021-02-17 ENCOUNTER — Encounter: Payer: Self-pay | Admitting: Nurse Practitioner

## 2021-02-17 VITALS — BP 120/78 | HR 80 | Temp 98.3°F | Ht 65.0 in | Wt 192.2 lb

## 2021-02-17 DIAGNOSIS — E782 Mixed hyperlipidemia: Secondary | ICD-10-CM

## 2021-02-17 DIAGNOSIS — F3281 Premenstrual dysphoric disorder: Secondary | ICD-10-CM

## 2021-02-17 DIAGNOSIS — R439 Unspecified disturbances of smell and taste: Secondary | ICD-10-CM

## 2021-02-17 DIAGNOSIS — E7849 Other hyperlipidemia: Secondary | ICD-10-CM | POA: Diagnosis not present

## 2021-02-17 NOTE — Assessment & Plan Note (Addendum)
Chronic.  Will recheck lipids today with electrolytes and kidney function - patient is fasting.  Continue atorvastatin 20 mg daily.  LDL goal is less than 100.  Follow up in 6 months for CPE.

## 2021-02-17 NOTE — Assessment & Plan Note (Signed)
Chronic, improved with Wellbutrin 100 mg twice daily.  Will plan to continue collaboration with medical weight management. Follow up in 6 months.

## 2021-02-18 LAB — LIPID PANEL
Cholesterol: 161 mg/dL (ref <200)
HDL: 37 mg/dL — ABNORMAL LOW (ref >=50)
LDL Cholesterol (Calc): 105 mg/dL (calc) — ABNORMAL HIGH
Non-HDL Cholesterol (Calc): 124 mg/dL (calc) (ref <130)
Total CHOL/HDL Ratio: 4.4 (calc) (ref <5.0)
Triglycerides: 99 mg/dL (ref <150)

## 2021-02-18 LAB — COMPLETE METABOLIC PANEL WITH GFR
AG Ratio: 1.1 (calc) (ref 1.0–2.5)
ALT: 20 U/L (ref 6–29)
AST: 17 U/L (ref 10–35)
Albumin: 4 g/dL (ref 3.6–5.1)
Alkaline phosphatase (APISO): 87 U/L (ref 37–153)
BUN: 18 mg/dL (ref 7–25)
CO2: 30 mmol/L (ref 20–32)
Calcium: 9.8 mg/dL (ref 8.6–10.4)
Chloride: 102 mmol/L (ref 98–110)
Creat: 0.73 mg/dL (ref 0.50–1.03)
Globulin: 3.5 g/dL (calc) (ref 1.9–3.7)
Glucose, Bld: 98 mg/dL (ref 65–99)
Potassium: 4.4 mmol/L (ref 3.5–5.3)
Sodium: 139 mmol/L (ref 135–146)
Total Bilirubin: 0.6 mg/dL (ref 0.2–1.2)
Total Protein: 7.5 g/dL (ref 6.1–8.1)
eGFR: 95 mL/min/{1.73_m2} (ref >=60)

## 2021-02-22 ENCOUNTER — Other Ambulatory Visit: Payer: Self-pay | Admitting: Nurse Practitioner

## 2021-02-22 ENCOUNTER — Encounter: Payer: Self-pay | Admitting: Nurse Practitioner

## 2021-02-22 MED ORDER — ATORVASTATIN CALCIUM 40 MG PO TABS: 40.0000 mg | ORAL_TABLET | Freq: Every day | ORAL | 1 refills | Status: DC

## 2021-02-23 ENCOUNTER — Other Ambulatory Visit (HOSPITAL_COMMUNITY): Payer: Self-pay

## 2021-02-23 ENCOUNTER — Other Ambulatory Visit: Payer: Self-pay | Admitting: Nurse Practitioner

## 2021-02-23 MED ORDER — ATORVASTATIN CALCIUM 40 MG PO TABS
40.0000 mg | ORAL_TABLET | Freq: Every day | ORAL | 1 refills | Status: DC
  Filled 2021-02-23: qty 90, 90d supply, fill #0
  Filled 2021-05-23 – 2021-06-01 (×2): qty 90, 90d supply, fill #1

## 2021-02-24 ENCOUNTER — Encounter (INDEPENDENT_AMBULATORY_CARE_PROVIDER_SITE_OTHER): Payer: Self-pay | Admitting: Family Medicine

## 2021-02-24 ENCOUNTER — Other Ambulatory Visit (HOSPITAL_COMMUNITY): Payer: Self-pay

## 2021-02-24 ENCOUNTER — Other Ambulatory Visit: Payer: Self-pay

## 2021-02-24 ENCOUNTER — Ambulatory Visit (INDEPENDENT_AMBULATORY_CARE_PROVIDER_SITE_OTHER): Payer: 59 | Admitting: Family Medicine

## 2021-02-24 VITALS — BP 116/73 | HR 82 | Temp 98.2°F | Ht 64.0 in | Wt 187.0 lb

## 2021-02-24 DIAGNOSIS — E7849 Other hyperlipidemia: Secondary | ICD-10-CM

## 2021-02-24 DIAGNOSIS — E669 Obesity, unspecified: Secondary | ICD-10-CM

## 2021-02-24 DIAGNOSIS — Z9189 Other specified personal risk factors, not elsewhere classified: Secondary | ICD-10-CM

## 2021-02-24 DIAGNOSIS — F3289 Other specified depressive episodes: Secondary | ICD-10-CM

## 2021-02-24 DIAGNOSIS — Z6834 Body mass index (BMI) 34.0-34.9, adult: Secondary | ICD-10-CM

## 2021-02-24 DIAGNOSIS — R7303 Prediabetes: Secondary | ICD-10-CM

## 2021-02-24 MED ORDER — BUPROPION HCL ER (SR) 100 MG PO TB12
100.0000 mg | ORAL_TABLET | Freq: Two times a day (BID) | ORAL | 0 refills | Status: DC
  Filled 2021-02-24 – 2021-03-18 (×2): qty 60, 30d supply, fill #0

## 2021-02-24 MED ORDER — SEMAGLUTIDE (1 MG/DOSE) 4 MG/3ML ~~LOC~~ SOPN
1.0000 mg | PEN_INJECTOR | SUBCUTANEOUS | 0 refills | Status: DC
  Filled 2021-02-24 – 2021-03-18 (×2): qty 3, 28d supply, fill #0

## 2021-02-24 NOTE — Progress Notes (Signed)
Chief Complaint:   OBESITY Victoria Garcia is here to discuss her progress with her obesity treatment plan along with follow-up of her obesity related diagnoses. [name] is on the Category 2 Plan and states she is following her eating plan approximately 70% of the time. [name] states she is walking for 40 minutes 2-3 times per week.  Today's visit was #: 8 Starting weight: 200 lbs Starting date: 10/27/2020 Today's weight: 187 lbs Today's date: 02/24/2021 Total lbs lost to date: 13 Total lbs lost since last in-office visit: 2  Interim History: [name] notes that if she eats a "real" piece of bread, she feels bloated and poor. She states she is a "junk food junkie".  Subjective:   1. Pre-diabetes Victoria Garcia has a diagnosis of prediabetes based on her elevated HgA1c and was informed this puts her at greater risk of developing diabetes. She continues to work on diet and exercise to decrease her risk of diabetes. She denies nausea or hypoglycemia.  2. Other hyperlipidemia Victoria Garcia's primary care provider rechecked her cholesterol and subsequently increased Lipitor from 20 mg to 40 mg just a couple of days ago. She is tolerating it well without side effects.   3. Other depression, with emotional eating Victoria Garcia's mood is stable and he is feeling great. She notes that she loves her medications being twice q daily. She has more energy the entire day and she is more focused. She is sleeping well, and she notes "it is a Secretary/administrator for her".   4. At risk for constipation Victoria Garcia is at increased risk for constipation due to increased protein intake.  Assessment/Plan:  No orders of the defined types were placed in this encounter.   Medications Discontinued During This Encounter  Medication Reason   buPROPion ER (WELLBUTRIN SR) 100 MG 12 hr tablet Reorder   Semaglutide, 1 MG/DOSE, 4 MG/3ML SOPN Reorder     Meds ordered this encounter  Medications   buPROPion ER (WELLBUTRIN SR) 100 MG 12 hr tablet     Sig: Take 1 tablet (100 mg total) by mouth 2 (two) times daily.    Dispense:  60 tablet    Refill:  0    Ov for rf   Semaglutide, 1 MG/DOSE, 4 MG/3ML SOPN    Sig: Inject 1 mg into the skin once a week.    Dispense:  3 mL    Refill:  0    30 d supply; ov for rf     1. Pre-diabetes We will refill Ozempic for 1 month at the same dose. [name] will focus on eating all of her food on the plan. She will continue to work on weight loss, exercise, and decreasing simple carbohydrates to help decrease the risk of diabetes.   - Semaglutide, 1 MG/DOSE, 4 MG/3ML SOPN; Inject 1 mg into the skin once a week.  Dispense: 3 mL; Refill: 0  2. Other hyperlipidemia Cardiovascular risk and specific lipid/LDL goals reviewed. We discussed several lifestyle modifications today. We will recheck AMP and ALT in 6-8 weeks, and recheck her cholesterol in 2-3 months. [name] will continue her higher statin dose, increase exercise, and continue her prudent nutritional plan with saturated and trans fats. Orders and follow up as documented in patient record.   Counseling Intensive lifestyle modifications are the first line treatment for this issue. Dietary changes: Increase soluble fiber. Decrease simple carbohydrates. Exercise changes: Moderate to vigorous-intensity aerobic activity 150 minutes per week if tolerated. Lipid-lowering medications: see documented in medical record.  3. Other depression, with emotional eating Behavior modification techniques were discussed today to help [name] deal with her emotional/non-hunger eating behaviors. We will refill Wellbutrin SR for 1 month. Orders and follow up as documented in patient record.   - buPROPion ER (WELLBUTRIN SR) 100 MG 12 hr tablet; Take 1 tablet (100 mg total) by mouth 2 (two) times daily.  Dispense: 60 tablet; Refill: 0  4. At risk for constipation Victoria Garcia was given approximately 9 minutes of counseling today regarding prevention of constipation. She was  encouraged to increase water and fiber intake.   5. Obesity with current BMI of 32.2 Early is currently in the action stage of change. As such, her goal is to continue with weight loss efforts. She has agreed to the Category 2 Plan.   Victoria Garcia is to eat all of the food on the plan without skipping and she is to weigh her protein.  Exercise goals: For substantial health benefits, adults should do at least 150 minutes (2 hours and 30 minutes) a week of moderate-intensity, or 75 minutes (1 hour and 15 minutes) a week of vigorous-intensity aerobic physical activity, or an equivalent combination of moderate- and vigorous-intensity aerobic activity. Aerobic activity should be performed in episodes of at least 10 minutes, and preferably, it should be spread throughout the week.  Behavioral modification strategies: increasing lean protein intake, decreasing simple carbohydrates, and planning for success.  Victoria Garcia has agreed to follow-up with our clinic in 3 weeks. She was informed of the importance of frequent follow-up visits to maximize her success with intensive lifestyle modifications for her multiple health conditions.   Objective:   Blood pressure 116/73, pulse 82, temperature 98.2 F (36.8 C), height 5\' 4"  (1.626 m), weight 187 lb (84.8 kg), SpO2 99 %. Body mass index is 32.1 kg/m.  General: Cooperative, alert, well developed, in no acute distress. HEENT: Conjunctivae and lids unremarkable. Cardiovascular: Regular rhythm.  Lungs: Normal work of breathing. Neurologic: No focal deficits.   Lab Results  Component Value Date   CREATININE 0.73 02/17/2021   BUN 18 02/17/2021   NA 139 02/17/2021   K 4.4 02/17/2021   CL 102 02/17/2021   CO2 30 02/17/2021   Lab Results  Component Value Date   ALT 20 02/17/2021   AST 17 02/17/2021   ALKPHOS 92 01/13/2017   BILITOT 0.6 02/17/2021   Lab Results  Component Value Date   HGBA1C 6.0 (H) 01/04/2021   HGBA1C 5.9 (H) 08/17/2020   HGBA1C 6.1  (H) 03/30/2020   HGBA1C 6.2 (H) 12/16/2019   HGBA1C 6.1 (H) 08/05/2019   Lab Results  Component Value Date   INSULIN 14.3 10/27/2020   Lab Results  Component Value Date   TSH 0.859 10/27/2020   Lab Results  Component Value Date   CHOL 161 02/17/2021   HDL 37 (L) 02/17/2021   LDLCALC 105 (H) 02/17/2021   TRIG 99 02/17/2021   CHOLHDL 4.4 02/17/2021   Lab Results  Component Value Date   VD25OH 54 08/17/2020   VD25OH 58 08/05/2019   VD25OH 42 08/01/2017   Lab Results  Component Value Date   WBC 7.0 08/17/2020   HGB 13.5 08/17/2020   HCT 40.6 08/17/2020   MCV 90.6 08/17/2020   PLT 222 08/17/2020   Lab Results  Component Value Date   IRON 50 09/24/2014   Attestation Statements:   Reviewed by clinician on day of visit: allergies, medications, problem list, medical history, surgical history, family history, social history, and previous encounter  notes.   I, Meagin Mcclung, am acting as transcriptionist for [employer], DO.  I have reviewed the above documentation for accuracy and completeness, and I agree with the above. Marjory Sneddon, D.O.  The [place] was signed into law in 2016 which includes the topic of electronic health records.  This provides immediate access to information in MyChart.  This includes consultation notes, operative notes, office notes, lab results and pathology reports.  If you have any questions about what you read please let us know at your next visit so we can discuss your concerns and take corrective action if need be.  We are right here with you.

## 2021-03-01 NOTE — Progress Notes (Signed)
  Office: [phone]  /  Fax: [phone]    Date: March 15, 2021   Appointment Start Time: 2:01pm Duration: 21 minutes Provider: Lawerance Cruel, Psy.D. Type of Session: Individual Therapy  Location of Patient: Home (private location)  Location of Provider: Provider's Home (private office) Type of Contact: Telepsychological Visit via MyChart Video Visit  Session Content: Victoria Garcia is a 60 y.o. female presenting for a follow-up appointment to address the previously established treatment goal of increasing coping skills.Today's appointment was a telepsychological visit due to COVID-19. Victoria Garcia provided verbal consent for today's telepsychological appointment and she is aware she is responsible for securing confidentiality on her end of the session. Prior to proceeding with today's appointment, Victoria Garcia's physical location at the time of this appointment was obtained as well a phone number she could be reached at in the event of technical difficulties. Victoria Garcia and this provider participated in today's telepsychological service. Of note, today's appointment was switched to a regular telephone call at 2:14pm with Victoria Garcia's verbal consent due to technical issues.   This provider conducted a brief check-in. Victoria Garcia shared about recent events, including her appointments with Dr. Sharee Holster. Reviewed emotional and physical hunger. She acknowledged having an "aha moment" while on vaction. Processed thoughts/feelings. Psychoeducation regarding triggers for emotional eating was provided. Victoria Garcia was provided a handout, and encouraged to utilize the handout between now and the next appointment to increase awareness of triggers and frequency. Victoria Garcia agreed. This provider also discussed behavioral strategies for specific triggers, such as placing the utensil down when conversing to avoid mindless eating. Victoria Garcia provided verbal consent during today's appointment for this provider to send a handout about triggers via e-mail.  Overall, Victoria Garcia was receptive to today's appointment as evidenced by openness to sharing, responsiveness to feedback, and willingness to explore triggers for emotional eating.  Mental Status Examination:  Appearance: well groomed and appropriate hygiene  Behavior: appropriate to circumstances Mood: euthymic Affect: mood congruent Speech: normal in rate, volume, and tone Eye Contact: appropriate Psychomotor Activity: appropriate Gait: unable to assess Thought Process: linear, logical, and goal directed  Thought Content/Perception: no hallucinations, delusions, bizarre thinking or behavior reported or observed and no evidence or endorsement of suicidal and homicidal ideation, plan, and intent Orientation: time, person, place, and purpose of appointment Memory/Concentration: memory, attention, language, and fund of knowledge intact  Insight/Judgment: good  Interventions:  Conducted a brief chart review Provided empathic reflections and validation Reviewed content from the previous session Employed supportive psychotherapy interventions to facilitate reduced distress and to improve coping skills with identified stressors Psychoeducation provided regarding triggers for emotional eating  DSM-5 Diagnosis(es): F50.89 Other Specified Feeding or Eating Disorder, Emotional Eating Behaviors and F41.9 Unspecified Anxiety Disorder  Treatment Goal & Progress: During the initial appointment with this provider, the following treatment goal was established: increase coping skills. [name] has demonstrated some progress in her goal as evidenced by increased awareness of hunger patterns.   Plan: The next appointment will be scheduled in approximately three weeks, which will be via MyChart Video Visit. The next session will focus on working towards the established treatment goal.

## 2021-03-15 ENCOUNTER — Ambulatory Visit (INDEPENDENT_AMBULATORY_CARE_PROVIDER_SITE_OTHER): Payer: 59 | Admitting: Family Medicine

## 2021-03-15 ENCOUNTER — Telehealth (INDEPENDENT_AMBULATORY_CARE_PROVIDER_SITE_OTHER): Payer: 59 | Admitting: Psychology

## 2021-03-15 DIAGNOSIS — F5089 Other specified eating disorder: Secondary | ICD-10-CM

## 2021-03-15 DIAGNOSIS — F419 Anxiety disorder, unspecified: Secondary | ICD-10-CM | POA: Diagnosis not present

## 2021-03-18 ENCOUNTER — Other Ambulatory Visit (HOSPITAL_COMMUNITY): Payer: Self-pay

## 2021-03-19 ENCOUNTER — Other Ambulatory Visit (HOSPITAL_COMMUNITY): Payer: Self-pay

## 2021-03-23 NOTE — Progress Notes (Signed)
  Office: [phone]  /  Fax: [phone]    Date: April 06, 2021   Appointment Start Time: 3:57pm Duration: 20 minutes Provider: Lawerance Cruel, Psy.D. Type of Session: Individual Therapy  Location of Patient: Home (private location) Location of Provider: Provider's Home (private office) Type of Contact: Telepsychological Visit via MyChart Video Visit  Session Content: Victoria Garcia is a 60 y.o. female presenting for a follow-up appointment to address the previously established treatment goal of increasing coping skills.Today's appointment was a telepsychological visit due to COVID-19. Victoria Garcia provided verbal consent for today's telepsychological appointment and she is aware she is responsible for securing confidentiality on her end of the session. Prior to proceeding with today's appointment, Victoria Garcia's physical location at the time of this appointment was obtained as well a phone number she could be reached at in the event of technical difficulties. Victoria Garcia and this provider participated in today's telepsychological service.   Of note, today's appointment was switched to a regular telephone call at 4:03pm with Victoria Garcia verbal consent due to technical issues.   This provider conducted a brief check-in. Victoria Garcia reported, "I've been doing pretty good." She discussed reviewing shared handouts, noting she recognized engaging in emotional eating behaviors secondary to stress and out of habit. Notably, she discussed a reduction in emotional eating behaviors. Positive reinforcement was provided. Psychoeducation provided regarding radical acceptance as she discussed ongoing stress at work that she does not have control over. Discussed utilizing the senses to self-soothe/cope. Overall, Victoria Garcia was receptive to today's appointment as evidenced by openness to sharing, responsiveness to feedback, and willingness to implement discussed strategies .  Mental Status Examination:  Appearance: well groomed and  appropriate hygiene  Behavior: appropriate to circumstances Mood: euthymic Affect: mood congruent Speech: normal in rate, volume, and tone Eye Contact: appropriate Psychomotor Activity: appropriate Gait: unable to assess Thought Process: linear, logical, and goal directed  Thought Content/Perception: no hallucinations, delusions, bizarre thinking or behavior reported or observed and no evidence or endorsement of suicidal and homicidal ideation, plan, and intent Orientation: time, person, place, and purpose of appointment Memory/Concentration: memory, attention, language, and fund of knowledge intact  Insight/Judgment: good  Interventions:  Conducted a brief chart review Provided empathic reflections and validation Reviewed content from the previous session Provided positive reinforcement Employed supportive psychotherapy interventions to facilitate reduced distress and to improve coping skills with identified stressors Psychoeducation provided regarding radical acceptance   DSM-5 Diagnosis(es): F50.89 Other Specified Feeding or Eating Disorder, Emotional Eating Behaviors and F41.9 Unspecified Anxiety Disorder  Treatment Goal & Progress: During the initial appointment with this provider, the following treatment goal was established: increase coping skills. Victoria Garcia has demonstrated progress in her goal as evidenced by increased awareness of hunger patterns, increased awareness of triggers for emotional eating behaviors, and reduction in emotional eating behaviors . Aidan also continues to demonstrate willingness to engage in learned skill(s).  Plan: The next appointment will be scheduled in two weeks, which will be via MyChart Video Visit. The next session will focus on working towards the established treatment goal.

## 2021-04-06 ENCOUNTER — Telehealth (INDEPENDENT_AMBULATORY_CARE_PROVIDER_SITE_OTHER): Payer: 59 | Admitting: Psychology

## 2021-04-06 DIAGNOSIS — F419 Anxiety disorder, unspecified: Secondary | ICD-10-CM | POA: Diagnosis not present

## 2021-04-06 DIAGNOSIS — F5089 Other specified eating disorder: Secondary | ICD-10-CM | POA: Diagnosis not present

## 2021-04-06 NOTE — Progress Notes (Unsigned)
  Office: [phone]  /  Fax: [phone]    Date: April 20, 2021   Appointment Start Time: *** Duration: *** minutes Provider: Lawerance Cruel, Psy.D. Type of Session: Individual Therapy  Location of Patient: {gbptloc:23249} Location of Provider: Provider's Home (private office) Type of Contact: Telepsychological Visit via MyChart Video Visit  Session Content: Tonishia is a 61 y.o. female presenting for a follow-up appointment to address the previously established treatment goal of increasing coping skills.Today's appointment was a telepsychological visit due to COVID-19. Mindi Junker provided verbal consent for today's telepsychological appointment and she is aware she is responsible for securing confidentiality on her end of the session. Prior to proceeding with today's appointment, Avory's physical location at the time of this appointment was obtained as well a phone number she could be reached at in the event of technical difficulties. Hassie and this provider participated in today's telepsychological service.   This provider conducted a brief check-in. *** Luvia was receptive to today's appointment as evidenced by openness to sharing, responsiveness to feedback, and {gbreceptiveness:23401}.  Mental Status Examination:  Appearance: {Appearance:22431} Behavior: {Behavior:22445} Mood: {gbmood:21757} Affect: {Affect:22436} Speech: {Speech:22432} Eye Contact: {Eye Contact:22433} Psychomotor Activity: {Motor Activity:22434} Gait: {gbgait:23404} Thought Process: {thought process:22448}  Thought Content/Perception: {disturbances:22451} Orientation: {Orientation:22437} Memory/Concentration: {gbcognition:22449} Insight/Judgment: {Insight:22446}  Interventions:  {Interventions for Progress Notes:23405}  DSM-5 Diagnosis(es): F50.89 Other Specified Feeding or Eating Disorder, Emotional Eating Behaviors and F41.9 Unspecified Anxiety Disorder  Treatment Goal & Progress: During the initial  appointment with this provider, the following treatment goal was established: increase coping skills. Terrah has demonstrated progress in her goal as evidenced by {gbtxprogress:22839}. Cadience also {gbtxprogress2:22951}.  Plan: The next appointment will be scheduled in {gbweeks:21758}, which will be {gbtxmodality:23402}. The next session will focus on {Plan for Next Appointment:23400}.

## 2021-04-08 ENCOUNTER — Ambulatory Visit (INDEPENDENT_AMBULATORY_CARE_PROVIDER_SITE_OTHER): Payer: 59 | Admitting: Family Medicine

## 2021-04-08 ENCOUNTER — Other Ambulatory Visit (HOSPITAL_COMMUNITY): Payer: Self-pay

## 2021-04-08 ENCOUNTER — Encounter (INDEPENDENT_AMBULATORY_CARE_PROVIDER_SITE_OTHER): Payer: Self-pay | Admitting: Family Medicine

## 2021-04-08 ENCOUNTER — Other Ambulatory Visit: Payer: Self-pay

## 2021-04-08 VITALS — BP 114/77 | HR 83 | Temp 98.5°F | Ht 64.0 in | Wt 185.0 lb

## 2021-04-08 DIAGNOSIS — E669 Obesity, unspecified: Secondary | ICD-10-CM

## 2021-04-08 DIAGNOSIS — Z6834 Body mass index (BMI) 34.0-34.9, adult: Secondary | ICD-10-CM | POA: Diagnosis not present

## 2021-04-08 DIAGNOSIS — R7303 Prediabetes: Secondary | ICD-10-CM

## 2021-04-08 DIAGNOSIS — F3289 Other specified depressive episodes: Secondary | ICD-10-CM

## 2021-04-08 DIAGNOSIS — Z9189 Other specified personal risk factors, not elsewhere classified: Secondary | ICD-10-CM | POA: Diagnosis not present

## 2021-04-08 MED ORDER — BUPROPION HCL ER (SR) 100 MG PO TB12
100.0000 mg | ORAL_TABLET | Freq: Two times a day (BID) | ORAL | 0 refills | Status: DC
  Filled 2021-04-08 – 2021-04-19 (×2): qty 60, 30d supply, fill #0

## 2021-04-08 MED ORDER — SEMAGLUTIDE (2 MG/DOSE) 8 MG/3ML ~~LOC~~ SOPN
2.0000 mg | PEN_INJECTOR | SUBCUTANEOUS | 0 refills | Status: DC
  Filled 2021-04-08: qty 3, 28d supply, fill #0

## 2021-04-08 NOTE — Progress Notes (Signed)
Chief Complaint:   OBESITY Victoria Garcia is here to discuss her progress with her obesity treatment plan along with follow-up of her obesity related diagnoses. Victoria Garcia is on the Category 2 Plan and states she is following her eating plan approximately 70% of the time. Victoria Garcia states she is walking 30-40 minutes 2 times per week.  Today's visit was #: 9 Starting weight: 200 lbs Starting date: 10/27/2020 Today's weight: 185 lbs Today's date: 04/08/2021 Total lbs lost to date: 15 Total lbs lost since last in-office visit: 2  Interim History: Victoria Garcia reports some days she didn't eat all of her food. Her husband was diagnosed with prostate cancer and had surgery. Pt has increased stressors.   Subjective:   1. Pre-diabetes Pt is worried about the holidays coming up and reports increased cravings.   2. Other depression, with emotional eating [name] is working with Dr. Dewaine Conger, which is helping a lot with all stressors. Lately, pt has been handling it very well. She denies need for change in dose of Wellbutrin.  3. At risk for malnutrition Victoria Garcia is at risk for malnutrition due to inadequate intake.  Assessment/Plan:  No orders of the defined types were placed in this encounter.   Medications Discontinued During This Encounter  Medication Reason   Semaglutide, 1 MG/DOSE, 4 MG/3ML SOPN    buPROPion ER (WELLBUTRIN SR) 100 MG 12 hr tablet Reorder     Meds ordered this encounter  Medications   buPROPion ER (WELLBUTRIN SR) 100 MG 12 hr tablet    Sig: Take 1 tablet (100 mg total) by mouth 2 (two) times daily.    Dispense:  60 tablet    Refill:  0    Ov for rf   Semaglutide, 2 MG/DOSE, 8 MG/3ML SOPN    Sig: Inject 2 mg as directed once a week.    Dispense:  3 mL    Refill:  0     1. Pre-diabetes Briceida will increase Ozempic from 1 mg to 2 mg weekly and continue to work on weight loss, exercise, and decreasing simple carbohydrates to help decrease the risk of diabetes.   Increase &  Refill- Semaglutide, 2 MG/DOSE, 8 MG/3ML SOPN; Inject 2 mg as directed once a week.  Dispense: 3 mL; Refill: 0  2. Other depression, with emotional eating Behavior modification techniques were discussed today to help Victoria Garcia deal with her emotional/non-hunger eating behaviors.  Orders and follow up as documented in patient record.   Refill- buPROPion ER (WELLBUTRIN SR) 100 MG 12 hr tablet; Take 1 tablet (100 mg total) by mouth 2 (two) times daily.  Dispense: 60 tablet; Refill: 0  3. At risk for malnutrition [name] was given extensive malnutrition prevention education and counseling today of more than 10 minutes.  Counseled her that malnutrition refers to inappropriate nutrients or not the right balance of nutrients for optimal health.  Discussed with Victoria Garcia that it is absolutely possible to be malnourished but yet obese.  Risk factors, including but not limited to, inappropriate dietary choices, difficulty with obtaining food due to physical or financial limitations, and various physical and mental health conditions were reviewed with Victoria Garcia.   4. Obesity with current BMI of 31.8  Victoria Garcia is currently in the action stage of change. As such, her goal is to continue with weight loss efforts. She has agreed to the Category 2 Plan.   Pt likes to set goals. For her next OV, she would like to focus on: Increasing  exercise to 30-40 minutes 4 days a week Weighing proteins and eat all food on plan Drink 70 oz of water per day  Exercise goals: For substantial health benefits, adults should do at least 150 minutes (2 hours and 30 minutes) a week of moderate-intensity, or 75 minutes (1 hour and 15 minutes) a week of vigorous-intensity aerobic physical activity, or an equivalent combination of moderate- and vigorous-intensity aerobic activity. Aerobic activity should be performed in episodes of at least 10 minutes, and preferably, it should be spread throughout the week.  Behavioral  modification strategies: increasing lean protein intake, no skipping meals, and holiday eating strategies .  Victoria Garcia has agreed to follow-up with our clinic in 2-3 weeks. She was informed of the importance of frequent follow-up visits to maximize her success with intensive lifestyle modifications for her multiple health conditions.   Objective:   Blood pressure 114/77, pulse 83, temperature 98.5 F (36.9 C), height 5\' 4"  (1.626 m), weight 185 lb (83.9 kg), SpO2 100 %. Body mass index is 31.76 kg/m.  General: Cooperative, alert, well developed, in no acute distress. HEENT: Conjunctivae and lids unremarkable. Cardiovascular: Regular rhythm.  Lungs: Normal work of breathing. Neurologic: No focal deficits.   Lab Results  Component Value Date   CREATININE 0.73 02/17/2021   BUN 18 02/17/2021   NA 139 02/17/2021   K 4.4 02/17/2021   CL 102 02/17/2021   CO2 30 02/17/2021   Lab Results  Component Value Date   ALT 20 02/17/2021   AST 17 02/17/2021   ALKPHOS 92 01/13/2017   BILITOT 0.6 02/17/2021   Lab Results  Component Value Date   HGBA1C 6.0 (H) 01/04/2021   HGBA1C 5.9 (H) 08/17/2020   HGBA1C 6.1 (H) 03/30/2020   HGBA1C 6.2 (H) 12/16/2019   HGBA1C 6.1 (H) 08/05/2019   Lab Results  Component Value Date   INSULIN 14.3 10/27/2020   Lab Results  Component Value Date   TSH 0.859 10/27/2020   Lab Results  Component Value Date   CHOL 161 02/17/2021   HDL 37 (L) 02/17/2021   LDLCALC 105 (H) 02/17/2021   TRIG 99 02/17/2021   CHOLHDL 4.4 02/17/2021   Lab Results  Component Value Date   VD25OH 54 08/17/2020   VD25OH 58 08/05/2019   VD25OH 42 08/01/2017   Lab Results  Component Value Date   WBC 7.0 08/17/2020   HGB 13.5 08/17/2020   HCT 40.6 08/17/2020   MCV 90.6 08/17/2020   PLT 222 08/17/2020   Lab Results  Component Value Date   IRON 50 09/24/2014    Attestation Statements:   Reviewed by clinician on day of visit: allergies, medications, problem list,  medical history, surgical history, family history, social history, and previous encounter notes.  11/24/2014, CMA, am acting as transcriptionist for [name], DO.  I have reviewed the above documentation for accuracy and completeness, and I agree with the above. [employer], D.O.  The 21st Century Cures Act was signed into law in 2016 which includes the topic of electronic health records.  This provides immediate access to information in MyChart.  This includes consultation notes, operative notes, office notes, lab results and pathology reports.  If you have any questions about what you read please let 2017 know at your next visit so we can discuss your concerns and take corrective action if need be.  We are right here with you.

## 2021-04-12 ENCOUNTER — Other Ambulatory Visit (HOSPITAL_COMMUNITY): Payer: Self-pay

## 2021-04-19 ENCOUNTER — Other Ambulatory Visit (HOSPITAL_COMMUNITY): Payer: Self-pay

## 2021-04-20 ENCOUNTER — Telehealth (INDEPENDENT_AMBULATORY_CARE_PROVIDER_SITE_OTHER): Payer: 59 | Admitting: Psychology

## 2021-04-26 ENCOUNTER — Other Ambulatory Visit (HOSPITAL_COMMUNITY): Payer: Self-pay

## 2021-04-26 DIAGNOSIS — N390 Urinary tract infection, site not specified: Secondary | ICD-10-CM | POA: Diagnosis not present

## 2021-04-26 MED ORDER — NITROFURANTOIN MONOHYD MACRO 100 MG PO CAPS
100.0000 mg | ORAL_CAPSULE | Freq: Two times a day (BID) | ORAL | 0 refills | Status: DC
  Filled 2021-04-26: qty 10, 5d supply, fill #0

## 2021-04-27 ENCOUNTER — Ambulatory Visit (INDEPENDENT_AMBULATORY_CARE_PROVIDER_SITE_OTHER): Payer: 59 | Admitting: Family Medicine

## 2021-04-27 ENCOUNTER — Other Ambulatory Visit: Payer: Self-pay

## 2021-04-27 ENCOUNTER — Encounter (INDEPENDENT_AMBULATORY_CARE_PROVIDER_SITE_OTHER): Payer: Self-pay | Admitting: Family Medicine

## 2021-04-27 VITALS — BP 127/79 | HR 86 | Temp 98.5°F | Ht 64.0 in | Wt 184.0 lb

## 2021-04-27 DIAGNOSIS — F3289 Other specified depressive episodes: Secondary | ICD-10-CM | POA: Diagnosis not present

## 2021-04-27 DIAGNOSIS — R7303 Prediabetes: Secondary | ICD-10-CM

## 2021-04-27 DIAGNOSIS — Z6834 Body mass index (BMI) 34.0-34.9, adult: Secondary | ICD-10-CM

## 2021-04-27 DIAGNOSIS — E669 Obesity, unspecified: Secondary | ICD-10-CM | POA: Diagnosis not present

## 2021-04-27 MED ORDER — SEMAGLUTIDE (2 MG/DOSE) 8 MG/3ML ~~LOC~~ SOPN
2.0000 mg | PEN_INJECTOR | SUBCUTANEOUS | 0 refills | Status: DC
  Filled 2021-04-27: qty 3, 28d supply, fill #0

## 2021-04-27 MED ORDER — BUPROPION HCL ER (SR) 100 MG PO TB12
100.0000 mg | ORAL_TABLET | Freq: Two times a day (BID) | ORAL | 0 refills | Status: DC
  Filled 2021-04-27: qty 60, 30d supply, fill #0

## 2021-04-28 ENCOUNTER — Other Ambulatory Visit (HOSPITAL_COMMUNITY): Payer: Self-pay

## 2021-04-28 NOTE — Progress Notes (Signed)
Chief Complaint:   OBESITY Victoria Garcia is here to discuss her progress with her obesity treatment plan along with follow-up of her obesity related diagnoses. Victoria Garcia is on the Category 2 Plan and states she is following her eating plan approximately 60% of the time. Victoria Garcia states she is not currently performing exercise.  Today's visit was #: 10 Starting weight: 200 lbs Starting date: 10/27/2020 Today's weight: 184 lbs Today's date: 04/27/2021 Total lbs lost to date: 16 Total lbs lost since last in-office visit: 1  Interim History: Victoria Garcia went through a tough time the past couple of weeks with her husband's surgery and all. She didn't meet her goals that were made at her last office visit due to stressors. She wishes to keep the same 3 goals for the next office visit.   Subjective:   1. Pre-diabetes We increased Aiva's dose of Ozempic at her last office visit to 2 mg, and she is tolerating it well. She notes it is helping decrease her appetite and it has decreased her interest in eating poorly. She notes decreased sweet cravings and she did great over the holiday.  2. Other depression, with emotional eating Victoria Garcia is doing well emotionally. She denies emotional eating, and her mood is stable. She handled her recent stressors exceptionally well.  Assessment/Plan:  No orders of the defined types were placed in this encounter.   Medications Discontinued During This Encounter  Medication Reason   buPROPion ER (WELLBUTRIN SR) 100 MG 12 hr tablet Reorder   Semaglutide, 2 MG/DOSE, 8 MG/3ML SOPN Reorder     Meds ordered this encounter  Medications   buPROPion ER (WELLBUTRIN SR) 100 MG 12 hr tablet    Sig: Take 1 tablet (100 mg total) by mouth 2 (two) times daily.    Dispense:  60 tablet    Refill:  0    Ov for rf   Semaglutide, 2 MG/DOSE, 8 MG/3ML SOPN    Sig: Inject 2 mg as directed once a week.    Dispense:  3 mL    Refill:  0     1. Pre-diabetes We will refill Ozempic for 1  month. Kenecia will continue to work on weight loss, exercise, and decreasing simple carbohydrates to help decrease the risk of diabetes.   - Semaglutide, 2 MG/DOSE, 8 MG/3ML SOPN; Inject 2 mg as directed once a week.  Dispense: 3 mL; Refill: 0  2. Other depression, with emotional eating We will refill Wellbutrin SR for 1 month. Behavior modification techniques were discussed today to help Nel deal with her emotional/non-hunger eating behaviors. She is to increase movement/exercise. Orders and follow up as documented in patient record.   - buPROPion ER (WELLBUTRIN SR) 100 MG 12 hr tablet; Take 1 tablet (100 mg total) by mouth 2 (two) times daily.  Dispense: 60 tablet; Refill: 0  3. Obesity BMI today is 92 Enez is currently in the action stage of change. As such, her goal is to continue with weight loss efforts. She has agreed to the Category 2 Plan with protein equivalents.   3 goals: Exercise for 30 minutes 3 days per week, drink 70 oz of water per day, and weight and eat her protein.  Exercise goals: All adults should avoid inactivity. Some physical activity is better than none, and adults who participate in any amount of physical activity gain some health benefits.  Behavioral modification strategies: meal planning and cooking strategies and emotional eating strategies.  Victoria Garcia has agreed to follow-up with  our clinic in 2 weeks. She was informed of the importance of frequent follow-up visits to maximize her success with intensive lifestyle modifications for her multiple health conditions.   Objective:   Blood pressure 127/79, pulse 86, temperature 98.5 F (36.9 C), height 5\' 4"  (1.626 m), weight 184 lb (83.5 kg), SpO2 98 %. Body mass index is 31.58 kg/m.  General: Cooperative, alert, well developed, in no acute distress. HEENT: Conjunctivae and lids unremarkable. Cardiovascular: Regular rhythm.  Lungs: Normal work of breathing. Neurologic: No focal deficits.   Lab Results   Component Value Date   CREATININE 0.73 02/17/2021   BUN 18 02/17/2021   NA 139 02/17/2021   K 4.4 02/17/2021   CL 102 02/17/2021   CO2 30 02/17/2021   Lab Results  Component Value Date   ALT 20 02/17/2021   AST 17 02/17/2021   ALKPHOS 92 01/13/2017   BILITOT 0.6 02/17/2021   Lab Results  Component Value Date   HGBA1C 6.0 (H) 01/04/2021   HGBA1C 5.9 (H) 08/17/2020   HGBA1C 6.1 (H) 03/30/2020   HGBA1C 6.2 (H) 12/16/2019   HGBA1C 6.1 (H) 08/05/2019   Lab Results  Component Value Date   INSULIN 14.3 10/27/2020   Lab Results  Component Value Date   TSH 0.859 10/27/2020   Lab Results  Component Value Date   CHOL 161 02/17/2021   HDL 37 (L) 02/17/2021   LDLCALC 105 (H) 02/17/2021   TRIG 99 02/17/2021   CHOLHDL 4.4 02/17/2021   Lab Results  Component Value Date   VD25OH 54 08/17/2020   VD25OH 58 08/05/2019   VD25OH 42 08/01/2017   Lab Results  Component Value Date   WBC 7.0 08/17/2020   HGB 13.5 08/17/2020   HCT 40.6 08/17/2020   MCV 90.6 08/17/2020   PLT 222 08/17/2020   Lab Results  Component Value Date   IRON 50 09/24/2014   Attestation Statements:   Reviewed by clinician on day of visit: allergies, medications, problem list, medical history, surgical history, family history, social history, and previous encounter notes.   11/24/2014, am acting as transcriptionist for Trude Mcburney, DO.  I have reviewed the above documentation for accuracy and completeness, and I agree with the above. [employer], D.O.  The 21st Century Cures Act was signed into law in 2016 which includes the topic of electronic health records.  This provides immediate access to information in MyChart.  This includes consultation notes, operative notes, office notes, lab results and pathology reports.  If you have any questions about what you read please let 2017 know at your next visit so we can discuss your concerns and take corrective action if need be.  We are right  here with you.

## 2021-04-30 ENCOUNTER — Other Ambulatory Visit (HOSPITAL_COMMUNITY): Payer: Self-pay

## 2021-05-10 ENCOUNTER — Encounter: Payer: Self-pay | Admitting: Nurse Practitioner

## 2021-05-10 ENCOUNTER — Other Ambulatory Visit (HOSPITAL_COMMUNITY): Payer: Self-pay

## 2021-05-10 ENCOUNTER — Ambulatory Visit: Payer: 59 | Admitting: Nurse Practitioner

## 2021-05-10 ENCOUNTER — Other Ambulatory Visit: Payer: Self-pay

## 2021-05-10 VITALS — BP 122/82 | HR 97 | Ht 64.0 in | Wt 185.0 lb

## 2021-05-10 DIAGNOSIS — R399 Unspecified symptoms and signs involving the genitourinary system: Secondary | ICD-10-CM | POA: Diagnosis not present

## 2021-05-10 DIAGNOSIS — R300 Dysuria: Secondary | ICD-10-CM | POA: Diagnosis not present

## 2021-05-10 LAB — URINALYSIS, ROUTINE W REFLEX MICROSCOPIC
Casts: NONE SEEN /LPF
Crystals: NONE SEEN /HPF
Hyaline Cast: NONE SEEN /LPF
WBC, UA: >=60 /HPF — AB (ref 0–5)
Yeast: NONE SEEN /HPF

## 2021-05-10 MED ORDER — CEPHALEXIN 500 MG PO CAPS
500.0000 mg | ORAL_CAPSULE | Freq: Four times a day (QID) | ORAL | 0 refills | Status: DC
  Filled 2021-05-10: qty 20, 5d supply, fill #0

## 2021-05-10 NOTE — Progress Notes (Signed)
Subjective:    [name], female    DOB: 04-14-1961, 60 y.o.   MRN: [id number]  HPI: Victoria Garcia is a 60 y.o. female presenting for  Chief Complaint  Patient presents with   Dysuria   URINARY SYMPTOMS Duration: weeks - started Macrobid a couple weeks ago via teledoc.  Symptoms improved but did not go away all of the way.   Dysuria: yes Urinary frequency: no Urgency: yes Small volume voids: yes Symptom severity: moderate Urinary incontinence: no Foul odor: yes Hematuria:  thinks saw some Saturday Abdominal pain: no Back pain: no Suprapubic pain/pressure: yes Flank pain: no Fever:  no Nausea: yes; queasy yesterday Vomiting: no Relief with cranberry juice: no Relief with pyridium: yes Status: better, then worse again Previous urinary tract infection: yes - 5 years Recurrent urinary tract infection: no Sexual activity: Sexually active with 1 partner History of sexually transmitted disease: no Vaginal discharge: no Treatments attempted: increased water, Macrobid, Azo with pyridium  Allergies  Allergen Reactions   Contrast Media [Iodinated Diagnostic Agents] Anaphylaxis   Shellfish Allergy Anaphylaxis   Penicillins Rash and Other (See Comments)    Has patient had a PCN reaction causing immediate rash, facial/tongue/throat swelling, SOB or lightheadedness with hypotension: No Has patient had a PCN reaction causing severe rash involving mucus membranes or skin necrosis: No Has patient had a PCN reaction that required hospitalization No Has patient had a PCN reaction occurring within the last 10 years: No If all of the above answers are "NO", then may proceed with Cephalosporin use.   Sulfa Antibiotics Rash    Outpatient Encounter Medications as of 05/10/2021  Medication Sig   cephALEXin (KEFLEX) 500 MG capsule Take 1 capsule (500 mg total) by mouth every 6 (six) hours.   atorvastatin (LIPITOR) 40 MG tablet Take 1 tablet (40 mg total) by mouth  daily.   buPROPion ER (WELLBUTRIN SR) 100 MG 12 hr tablet Take 1 tablet (100 mg total) by mouth 2 (two) times daily.   Cholecalciferol (VITAMIN D) 2000 units tablet Take 1 tablet (2,000 Units total) by mouth daily.   levocetirizine (XYZAL) 5 MG tablet Take 5 mg by mouth every evening.   Multiple Vitamin (MULTIVITAMIN WITH MINERALS) TABS tablet Take 1 tablet by mouth daily.   Semaglutide, 2 MG/DOSE, 8 MG/3ML SOPN Inject 2 mg as directed once a week.   [DISCONTINUED] nitrofurantoin, macrocrystal-monohydrate, (MACROBID) 100 MG capsule Take 1 capsule (100 mg total) by mouth 2 (two) times daily for 5 days with food   No facility-administered encounter medications on file as of 05/10/2021.    Patient Active Problem List   Diagnosis Date Noted   Other fatigue 10/27/2020   SOBOE (shortness of breath on exertion) 10/27/2020   Prediabetes 10/27/2020   Depression 10/27/2020   At risk for diabetes mellitus 10/27/2020   GAD (generalized anxiety disorder) 04/23/2018   Hyperlipidemia 01/13/2017   DDD (degenerative disc disease), lumbar 06/20/2016   Knee pain, bilateral 06/20/2016   Vitamin D deficiency 06/20/2016   Menopausal symptoms 06/20/2016   Glucose intolerance (impaired glucose tolerance) 09/24/2014   PMDD (premenstrual dysphoric disorder) 05/07/2014   Class 2 obesity 05/07/2014   Allergies 05/07/2014    Past Medical History:  Diagnosis Date   Allergy    SEASONAL   Anemia    Anemia, iron deficiency 09/24/2014   Back pain    Depression    History of stomach ulcers    Hyperlipidemia    Joint pain  Joint pain    Multiple food allergies    Prediabetes    SOBOE (shortness of breath on exertion)    Vitamin D deficiency     Relevant past medical, surgical, family and social history reviewed and updated as indicated. Interim medical history since our last visit reviewed.  Review of Systems Per HPI unless specifically indicated above     Objective:    BP 122/82    Pulse 97     Ht 5\' 4"  (1.626 m)    Wt 185 lb (83.9 kg)    SpO2 96%    BMI 31.76 kg/m   Wt Readings from Last 3 Encounters:  05/10/21 185 lb (83.9 kg)  04/27/21 184 lb (83.5 kg)  04/08/21 185 lb (83.9 kg)    Physical Exam Vitals and nursing note reviewed.  Constitutional:      General: She is not in acute distress.    Appearance: Normal appearance. She is not toxic-appearing.  Abdominal:     General: Abdomen is flat. Bowel sounds are normal. There is no distension.     Palpations: Abdomen is soft.     Tenderness: There is no right CVA tenderness or left CVA tenderness.       Comments: Tender to palpation  Skin:    General: Skin is warm and dry.     Coloration: Skin is not jaundiced or pale.     Findings: No erythema.  Neurological:     Mental Status: She is alert and oriented to person, place, and time.     Motor: No weakness.     Gait: Gait normal.  Psychiatric:        Mood and Affect: Mood normal.        Behavior: Behavior normal.        Thought Content: Thought content normal.        Judgment: Judgment normal.      Assessment & Plan:  1. Dysuria Acute.  UA today unable to be performed reliably secondary to pyridium use.  Under microscope, >60 WBC seen, many bacteria.  Will send urine for culture and in meantime treat with Keflex q 6 hours x 5 days.  Discussed ER precautions including nausea/vomiting and unable to keep fluids down.  Follow up if symptoms worsen.   - Urinalysis, Routine w reflex microscopic - Urine Culture    Follow up plan: Return if symptoms worsen or fail to improve.

## 2021-05-11 ENCOUNTER — Telehealth: Payer: Self-pay

## 2021-05-11 LAB — URINE CULTURE
MICRO NUMBER:: 12774029
SPECIMEN QUALITY:: ADEQUATE

## 2021-05-11 NOTE — Telephone Encounter (Signed)
Urine sample was unable to be resulted due to color interference.

## 2021-05-12 NOTE — Telephone Encounter (Signed)
Please clarify - was this on urine culture?  They can typically still run urine cultures

## 2021-05-12 NOTE — Telephone Encounter (Signed)
Urine culture was resulted.  Disregard message.

## 2021-05-13 ENCOUNTER — Encounter (INDEPENDENT_AMBULATORY_CARE_PROVIDER_SITE_OTHER): Payer: Self-pay | Admitting: Family Medicine

## 2021-05-13 ENCOUNTER — Encounter: Payer: Self-pay | Admitting: Nurse Practitioner

## 2021-05-13 ENCOUNTER — Other Ambulatory Visit (HOSPITAL_COMMUNITY): Payer: Self-pay

## 2021-05-13 ENCOUNTER — Other Ambulatory Visit: Payer: Self-pay

## 2021-05-13 ENCOUNTER — Ambulatory Visit (INDEPENDENT_AMBULATORY_CARE_PROVIDER_SITE_OTHER): Payer: 59 | Admitting: Family Medicine

## 2021-05-13 VITALS — BP 148/78 | HR 79 | Temp 97.4°F | Ht 64.0 in | Wt 182.0 lb

## 2021-05-13 DIAGNOSIS — Z6831 Body mass index (BMI) 31.0-31.9, adult: Secondary | ICD-10-CM

## 2021-05-13 DIAGNOSIS — R7303 Prediabetes: Secondary | ICD-10-CM | POA: Diagnosis not present

## 2021-05-13 DIAGNOSIS — E669 Obesity, unspecified: Secondary | ICD-10-CM | POA: Diagnosis not present

## 2021-05-13 DIAGNOSIS — E66811 Obesity, class 1: Secondary | ICD-10-CM

## 2021-05-13 DIAGNOSIS — Z6834 Body mass index (BMI) 34.0-34.9, adult: Secondary | ICD-10-CM

## 2021-05-13 DIAGNOSIS — F3289 Other specified depressive episodes: Secondary | ICD-10-CM

## 2021-05-13 DIAGNOSIS — Z9189 Other specified personal risk factors, not elsewhere classified: Secondary | ICD-10-CM

## 2021-05-13 MED ORDER — BUPROPION HCL ER (SR) 100 MG PO TB12
100.0000 mg | ORAL_TABLET | Freq: Two times a day (BID) | ORAL | 0 refills | Status: DC
  Filled 2021-05-13: qty 60, 30d supply, fill #0

## 2021-05-13 MED ORDER — SEMAGLUTIDE (2 MG/DOSE) 8 MG/3ML ~~LOC~~ SOPN
2.0000 mg | PEN_INJECTOR | SUBCUTANEOUS | 0 refills | Status: DC
  Filled 2021-05-13: qty 3, 28d supply, fill #0

## 2021-05-13 NOTE — Progress Notes (Signed)
Chief Complaint:   OBESITY Victoria Garcia is here to discuss her progress with her obesity treatment plan along with follow-up of her obesity related diagnoses. Victoria Garcia is on the Category 2 Plan with protein equivalents and states she is following her eating plan approximately 60% of the time. Victoria Garcia states she is walking for 30 minutes 2 times per week.  Today's visit was #: 11 Starting weight: 200 lbs Starting date: 10/27/2020 Today's weight: 182 lbs Today's date: 05/13/2021 Total lbs lost to date: 18 Total lbs lost since last in-office visit: 2  Interim History: Victoria Garcia is here for a follow up office visit. We reviewed her meal plan and questions were answered. Patient's food recall appears to be accurate and consistent with what is on plan when she is following it. When eating on plan, her hunger and cravings are well controlled. She has no concerns about the holidays, eating, etc.  Subjective:   1. Pre-diabetes Victoria Garcia has a diagnosis of pre-diabetes based on her elevated HgA1c and was informed this puts her at greater risk of developing diabetes. She is doing great on Ozempic, and she denies hunger or increased snacking. She continues to work on diet and exercise to decrease her risk of diabetes. She denies nausea or hypoglycemia.  2. Other depression, with emotional eating Victoria Garcia struggles with emotional eating and using food for comfort to the extent that it is negatively impacting her health. She has been working on behavior modification techniques to help reduce her emotional eating and has been somewhat successful. She is sleeping well and her mood is controlled. She shows no sign of suicidal or homicidal ideations.  3. At risk for constipation Victoria Garcia is at increased risk for constipation due to inadequate water intake, changes in diet, and/or use of medications such as GLP1 agonists. Victoria Garcia denies hard, infrequent stools currently.   Assessment/Plan:  No orders of the  defined types were placed in this encounter.   Medications Discontinued During This Encounter  Medication Reason   buPROPion ER (WELLBUTRIN SR) 100 MG 12 hr tablet Reorder   Semaglutide, 2 MG/DOSE, 8 MG/3ML SOPN Reorder     Meds ordered this encounter  Medications   buPROPion ER (WELLBUTRIN SR) 100 MG 12 hr tablet    Sig: Take 1 tablet (100 mg total) by mouth 2 (two) times daily.    Dispense:  60 tablet    Refill:  0    Ov for rf   Semaglutide, 2 MG/DOSE, 8 MG/3ML SOPN    Sig: Inject 2 mg as directed once a week.    Dispense:  3 mL    Refill:  0     1. Pre-diabetes We will refill Ozempic 2 mg for 1 month. Victoria Garcia will continue to work on weight loss, exercise, and decreasing simple carbohydrates to help decrease the risk of diabetes.   - Semaglutide, 2 MG/DOSE, 8 MG/3ML SOPN; Inject 2 mg as directed once a week.  Dispense: 3 mL; Refill: 0  2. Other depression, with emotional eating We will refill Wellbutrin SR for 1 month. Behavior modification techniques were discussed today to help Victoria Garcia deal with her emotional/non-hunger eating behaviors. Orders and follow up as documented in patient record.   - buPROPion ER (WELLBUTRIN SR) 100 MG 12 hr tablet; Take 1 tablet (100 mg total) by mouth 2 (two) times daily.  Dispense: 60 tablet; Refill: 0  3. At risk for constipation Victoria Garcia was given approximately 9 minutes of counseling today regarding prevention of  constipation. She was encouraged to increase water and fiber intake.   4. Obesity with current BMI of 31.2 Victoria Garcia is currently in the action stage of change. As such, her goal is to continue with weight loss efforts. She has agreed to the Category 2 Plan.   Handout was given on holiday eating strategies.  Exercise goals: As is, increase as tolerated.  Behavioral modification strategies: holiday eating strategies  and planning for success.  Victoria Garcia has agreed to follow-up with our clinic in 2 to 3 weeks. She was informed of the  importance of frequent follow-up visits to maximize her success with intensive lifestyle modifications for her multiple health conditions.   Objective:   Blood pressure (!) 148/78, pulse 79, temperature (!) 97.4 F (36.3 C), height 5\' 4"  (1.626 m), weight 182 lb (82.6 kg), SpO2 100 %. Body mass index is 31.24 kg/m.  General: Cooperative, alert, well developed, in no acute distress. HEENT: Conjunctivae and lids unremarkable. Cardiovascular: Regular rhythm.  Lungs: Normal work of breathing. Neurologic: No focal deficits.   Lab Results  Component Value Date   CREATININE 0.73 02/17/2021   BUN 18 02/17/2021   NA 139 02/17/2021   K 4.4 02/17/2021   CL 102 02/17/2021   CO2 30 02/17/2021   Lab Results  Component Value Date   ALT 20 02/17/2021   AST 17 02/17/2021   ALKPHOS 92 01/13/2017   BILITOT 0.6 02/17/2021   Lab Results  Component Value Date   HGBA1C 6.0 (H) 01/04/2021   HGBA1C 5.9 (H) 08/17/2020   HGBA1C 6.1 (H) 03/30/2020   HGBA1C 6.2 (H) 12/16/2019   HGBA1C 6.1 (H) 08/05/2019   Lab Results  Component Value Date   INSULIN 14.3 10/27/2020   Lab Results  Component Value Date   TSH 0.859 10/27/2020   Lab Results  Component Value Date   CHOL 161 02/17/2021   HDL 37 (L) 02/17/2021   LDLCALC 105 (H) 02/17/2021   TRIG 99 02/17/2021   CHOLHDL 4.4 02/17/2021   Lab Results  Component Value Date   VD25OH 54 08/17/2020   VD25OH 58 08/05/2019   VD25OH 42 08/01/2017   Lab Results  Component Value Date   WBC 7.0 08/17/2020   HGB 13.5 08/17/2020   HCT 40.6 08/17/2020   MCV 90.6 08/17/2020   PLT 222 08/17/2020   Lab Results  Component Value Date   IRON 50 09/24/2014   Attestation Statements:   Reviewed by clinician on day of visit: allergies, medications, problem list, medical history, surgical history, family history, social history, and previous encounter notes.   11/24/2014, am acting as transcriptionist for Trude Mcburney, DO.  I have reviewed  the above documentation for accuracy and completeness, and I agree with the above. [employer], D.O.  The 21st Century Cures Act was signed into law in 2016 which includes the topic of electronic health records.  This provides immediate access to information in MyChart.  This includes consultation notes, operative notes, office notes, lab results and pathology reports.  If you have any questions about what you read please let 2017 know at your next visit so we can discuss your concerns and take corrective action if need be.  We are right here with you.

## 2021-05-21 ENCOUNTER — Other Ambulatory Visit (HOSPITAL_COMMUNITY): Payer: Self-pay

## 2021-05-24 ENCOUNTER — Other Ambulatory Visit (HOSPITAL_COMMUNITY): Payer: Self-pay

## 2021-06-01 ENCOUNTER — Other Ambulatory Visit: Payer: Self-pay

## 2021-06-01 ENCOUNTER — Other Ambulatory Visit (HOSPITAL_COMMUNITY): Payer: Self-pay

## 2021-06-01 ENCOUNTER — Ambulatory Visit (INDEPENDENT_AMBULATORY_CARE_PROVIDER_SITE_OTHER): Payer: No Typology Code available for payment source | Admitting: Family Medicine

## 2021-06-01 ENCOUNTER — Encounter (INDEPENDENT_AMBULATORY_CARE_PROVIDER_SITE_OTHER): Payer: Self-pay | Admitting: Family Medicine

## 2021-06-01 VITALS — BP 119/72 | HR 81 | Temp 98.1°F | Ht 64.0 in | Wt 180.0 lb

## 2021-06-01 DIAGNOSIS — R7303 Prediabetes: Secondary | ICD-10-CM | POA: Diagnosis not present

## 2021-06-01 DIAGNOSIS — F3289 Other specified depressive episodes: Secondary | ICD-10-CM | POA: Diagnosis not present

## 2021-06-01 DIAGNOSIS — E669 Obesity, unspecified: Secondary | ICD-10-CM | POA: Diagnosis not present

## 2021-06-01 DIAGNOSIS — Z9189 Other specified personal risk factors, not elsewhere classified: Secondary | ICD-10-CM

## 2021-06-01 DIAGNOSIS — Z6831 Body mass index (BMI) 31.0-31.9, adult: Secondary | ICD-10-CM | POA: Diagnosis not present

## 2021-06-01 MED ORDER — SEMAGLUTIDE (2 MG/DOSE) 8 MG/3ML ~~LOC~~ SOPN
2.0000 mg | PEN_INJECTOR | SUBCUTANEOUS | 0 refills | Status: DC
  Filled 2021-06-01: qty 3, 28d supply, fill #0

## 2021-06-01 MED ORDER — BUPROPION HCL ER (SR) 100 MG PO TB12
100.0000 mg | ORAL_TABLET | Freq: Two times a day (BID) | ORAL | 0 refills | Status: DC
  Filled 2021-06-01: qty 60, 30d supply, fill #0

## 2021-06-03 NOTE — Progress Notes (Signed)
Chief Complaint:   OBESITY Victoria Garcia is here to discuss her progress with her obesity treatment plan along with follow-up of her obesity related diagnoses. Victoria Garcia is on the Category 2 Plan and states she is following her eating plan approximately 75% of the time. Victoria Garcia states she is walking and doing stretches for 45 minutes 7 times per week.  Today's visit was #: 12 Starting weight: 200 lbs Starting date: 10/27/2020 Today's weight: 180 lbs Today's date: 06/01/2021 Total lbs lost to date: 20 Total lbs lost since last in-office visit: 2  Interim History: Victoria Garcia has done YouTube exercise videos over the holidays for at least 30 minutes everyday and she attributes this to helping her keep wt off over holidays. Excellent job!Victoria Garcia She has no issues with the meal plan.  Subjective:   1. Pre-diabetes Victoria Garcia has a diagnosis of pre-diabetes based on her elevated HgA1c and was informed this puts her at greater risk of developing diabetes. She continues to work on diet and exercise to decrease her risk of diabetes. She denies nausea or hypoglycemia.  2. Other depression, with emotional eating Victoria Garcia is struggling with emotional eating and using food for comfort to the extent that it is negatively impacting her health. She has been working on behavior modification techniques to help reduce her emotional eating and has been somewhat successful. She shows no sign of suicidal or homicidal ideations.  3. At risk for impaired metabolic function Victoria Garcia is at increased risk for impaired metabolic function due to current nutrition and muscle mass.    Assessment/Plan:   Orders Placed This Encounter  Procedures   Hemoglobin A1c    Medications Discontinued During This Encounter  Medication Reason   buPROPion ER (WELLBUTRIN SR) 100 MG 12 hr tablet Reorder   Semaglutide, 2 MG/DOSE, 8 MG/3ML SOPN Reorder     Meds ordered this encounter  Medications   buPROPion ER (WELLBUTRIN SR) 100 MG 12 hr tablet     Sig: Take 1 tablet (100 mg total) by mouth 2 (two) times daily.    Dispense:  60 tablet    Refill:  0    Ov for rf   Semaglutide, 2 MG/DOSE, 8 MG/3ML SOPN    Sig: Inject 2 mg as directed once a week.    Dispense:  3 mL    Refill:  0     1. Pre-diabetes - We will refill Ozempic for 1 month. She is tolerating it well and she feels it is definitely helping her with her weight loss journey. Ninette Cotta will continue to work on weight loss, exercise, and decreasing simple carbohydrates to help decrease the risk of diabetes.  - we will consider obtaining UTD labs --> A1c next ov if not done by PCP prior.   - A1c lab ordered for future - Semaglutide, 2 MG/DOSE, 8 MG/3ML SOPN; Inject 2 mg as directed once a week.  Dispense: 3 mL; Refill: 0   2. Other depression, with emotional eating Behavior modification techniques were discussed today to help Janeece deal with her emotional/non-hunger eating behaviors. We will refill Wellbutrin SR for 1 month. Last ov with Dewaine Conger on 04/06/21.  Told her she can make f/up appt prn.  Orders and follow up as documented in patient record.   - buPROPion ER (WELLBUTRIN SR) 100 MG 12 hr tablet; Take 1 tablet (100 mg total) by mouth 2 (two) times daily.  Dispense: 60 tablet; Refill: 0   3. At risk for impaired metabolic function Victoria Garcia was  given approximately 9 minutes of impaired  metabolic function prevention counseling today. We discussed intensive lifestyle modifications today with an emphasis on specific nutrition and exercise instructions and strategies.   Repetitive spaced learning was employed today to elicit superior memory formation and behavioral change.   4. Obesity with current BMI of 31.0 Victoria Garcia is currently in the action stage of change. As such, her goal is to continue with weight loss efforts. She has agreed to the Category 2 Plan.   Meal prep and planning was discussed with the patient today.  Exercise goals: As is.  Behavioral modification  strategies: meal planning and cooking strategies and planning for success.  Victoria Garcia has agreed to follow-up with our clinic in 2 weeks- obtain A1c. She was informed of the importance of frequent follow-up visits to maximize her success with intensive lifestyle modifications for her multiple health conditions.     Objective:   Blood pressure 119/72, pulse 81, temperature 98.1 F (36.7 C), height 5\' 4"  (1.626 m), weight 180 lb (81.6 kg), SpO2 99 %. Body mass index is 30.9 kg/m.  General: Cooperative, alert, well developed, in no acute distress. HEENT: Conjunctivae and lids unremarkable. Cardiovascular: Regular rhythm.  Lungs: Normal work of breathing. Neurologic: No focal deficits.   Lab Results  Component Value Date   CREATININE 0.73 02/17/2021   BUN 18 02/17/2021   NA 139 02/17/2021   K 4.4 02/17/2021   CL 102 02/17/2021   CO2 30 02/17/2021   Lab Results  Component Value Date   ALT 20 02/17/2021   AST 17 02/17/2021   ALKPHOS 92 01/13/2017   BILITOT 0.6 02/17/2021   Lab Results  Component Value Date   HGBA1C 6.0 (H) 01/04/2021   HGBA1C 5.9 (H) 08/17/2020   HGBA1C 6.1 (H) 03/30/2020   HGBA1C 6.2 (H) 12/16/2019   HGBA1C 6.1 (H) 08/05/2019   Lab Results  Component Value Date   INSULIN 14.3 10/27/2020   Lab Results  Component Value Date   TSH 0.859 10/27/2020   Lab Results  Component Value Date   CHOL 161 02/17/2021   HDL 37 (L) 02/17/2021   LDLCALC 105 (H) 02/17/2021   TRIG 99 02/17/2021   CHOLHDL 4.4 02/17/2021   Lab Results  Component Value Date   VD25OH 54 08/17/2020   VD25OH 58 08/05/2019   VD25OH 42 08/01/2017   Lab Results  Component Value Date   WBC 7.0 08/17/2020   HGB 13.5 08/17/2020   HCT 40.6 08/17/2020   MCV 90.6 08/17/2020   PLT 222 08/17/2020   Lab Results  Component Value Date   IRON 50 09/24/2014   Attestation Statements:   Reviewed by clinician on day of visit: allergies, medications, problem list, medical history, surgical  history, family history, social history, and previous encounter notes.   11/24/2014, am acting as transcriptionist for Trude Mcburney, DO.  I have reviewed the above documentation for accuracy and completeness, and I agree with the above. [employer], D.O.  The 21st Century Cures Act was signed into law in 2016 which includes the topic of electronic health records.  This provides immediate access to information in MyChart.  This includes consultation notes, operative notes, office notes, lab results and pathology reports.  If you have any questions about what you read please let 2017 know at your next visit so we can discuss your concerns and take corrective action if need be.  We are right here with you.

## 2021-06-17 ENCOUNTER — Ambulatory Visit (INDEPENDENT_AMBULATORY_CARE_PROVIDER_SITE_OTHER): Payer: No Typology Code available for payment source | Admitting: Family Medicine

## 2021-06-17 ENCOUNTER — Other Ambulatory Visit (HOSPITAL_COMMUNITY): Payer: Self-pay

## 2021-06-17 ENCOUNTER — Encounter (INDEPENDENT_AMBULATORY_CARE_PROVIDER_SITE_OTHER): Payer: Self-pay | Admitting: Family Medicine

## 2021-06-17 ENCOUNTER — Other Ambulatory Visit: Payer: Self-pay

## 2021-06-17 VITALS — BP 124/79 | HR 77 | Temp 98.3°F | Ht 64.0 in | Wt 179.0 lb

## 2021-06-17 DIAGNOSIS — E7849 Other hyperlipidemia: Secondary | ICD-10-CM | POA: Diagnosis not present

## 2021-06-17 DIAGNOSIS — R7303 Prediabetes: Secondary | ICD-10-CM | POA: Diagnosis not present

## 2021-06-17 DIAGNOSIS — R4589 Other symptoms and signs involving emotional state: Secondary | ICD-10-CM

## 2021-06-17 DIAGNOSIS — E669 Obesity, unspecified: Secondary | ICD-10-CM | POA: Diagnosis not present

## 2021-06-17 DIAGNOSIS — F3289 Other specified depressive episodes: Secondary | ICD-10-CM

## 2021-06-17 DIAGNOSIS — Z6834 Body mass index (BMI) 34.0-34.9, adult: Secondary | ICD-10-CM

## 2021-06-17 DIAGNOSIS — Z9189 Other specified personal risk factors, not elsewhere classified: Secondary | ICD-10-CM

## 2021-06-17 DIAGNOSIS — Z6830 Body mass index (BMI) 30.0-30.9, adult: Secondary | ICD-10-CM

## 2021-06-17 MED ORDER — BUPROPION HCL ER (SR) 100 MG PO TB12
100.0000 mg | ORAL_TABLET | Freq: Two times a day (BID) | ORAL | 0 refills | Status: DC
  Filled 2021-06-17: qty 60, 30d supply, fill #0

## 2021-06-17 MED ORDER — SEMAGLUTIDE (2 MG/DOSE) 8 MG/3ML ~~LOC~~ SOPN
2.0000 mg | PEN_INJECTOR | SUBCUTANEOUS | 0 refills | Status: DC
  Filled 2021-06-17: qty 3, 28d supply, fill #0

## 2021-06-18 LAB — LIPID PANEL
Chol/HDL Ratio: 3.6 ratio (ref 0.0–4.4)
Cholesterol, Total: 143 mg/dL (ref 100–199)
HDL: 40 mg/dL (ref >39)
LDL Chol Calc (NIH): 89 mg/dL (ref 0–99)
Triglycerides: 71 mg/dL (ref 0–149)
VLDL Cholesterol Cal: 14 mg/dL (ref 5–40)

## 2021-06-18 LAB — HEMOGLOBIN A1C
Est. average glucose Bld gHb Est-mCnc: 126 mg/dL
Hgb A1c MFr Bld: 6 % — ABNORMAL HIGH (ref 4.8–5.6)

## 2021-06-18 LAB — LDL CHOLESTEROL, DIRECT: LDL Direct: 85 mg/dL (ref 0–99)

## 2021-06-21 NOTE — Progress Notes (Signed)
Chief Complaint:   OBESITY Victoria Garcia is here to discuss her progress with her obesity treatment plan along with follow-up of her obesity related diagnoses. Victoria Garcia is on the Category 2 Plan and states she is following her eating plan approximately 70% of the time. Victoria Garcia states she is walking for 30 minutes 3 times per week.  Today's visit was #: 9 Starting weight: 10/27/2020 Starting date: 200 lbs Today's weight: 178 lbs Today's date: 06/17/2021 Total lbs lost to date: 22 Total lbs lost since last in-office visit: 2  Interim History: Victoria Garcia has been doing much better with meal prepping, and she has no issues with eating all of her food. She denies hunger or cravings.  Subjective:   1. Pre-diabetes Victoria Garcia has a diagnosis of prediabetes based on her elevated HgA1c and was informed this puts her at greater risk of developing diabetes. She continues to work on diet and exercise to decrease her risk of diabetes. She denies nausea or hypoglycemia.  2. Other hyperlipidemia Victoria Garcia is tolerating medication(s) well without side effects. Medication compliance is good and patient appears to be taking it as prescribed. Denies additional concerns regarding this condition.   3. Depressed mood with emotional eating Victoria Garcia still notes struggling with emotional eating.  4. At risk for constipation Victoria Garcia is at increased risk for constipation due to inadequate water intake.  Assessment/Plan:   Orders Placed This Encounter  Procedures   Hemoglobin A1c   Lipid panel   LDL cholesterol, direct    Medications Discontinued During This Encounter  Medication Reason   buPROPion ER (WELLBUTRIN SR) 100 MG 12 hr tablet Reorder   Semaglutide, 2 MG/DOSE, 8 MG/3ML SOPN Reorder     Meds ordered this encounter  Medications   buPROPion ER (WELLBUTRIN SR) 100 MG 12 hr tablet    Sig: Take 1 tablet (100 mg total) by mouth 2 (two) times daily.    Dispense:  60 tablet    Refill:  0    Ov for rf    Semaglutide, 2 MG/DOSE, 8 MG/3ML SOPN    Sig: Inject 2 mg into the skin as directed once a week.    Dispense:  3 mL    Refill:  0     1. Pre-diabetes We will check labs today, and we will refill Ozempic for 1 month. Victoria Garcia will continue to work on weight loss, exercise, and decreasing simple carbohydrates to help decrease the risk of diabetes.   - Semaglutide, 2 MG/DOSE, 8 MG/3ML SOPN; Inject 2 mg into the skin as directed once a week.  Dispense: 3 mL; Refill: 0 - Hemoglobin A1c  2. Other hyperlipidemia We will check labs today. Victoria Garcia will continue Lipitor per her primary care provider. She will continue to work on diet, exercise and weight loss efforts. Orders and follow up as documented in patient record.   Counseling Intensive lifestyle modifications are the first line treatment for this issue. Dietary changes: Increase soluble fiber. Decrease simple carbohydrates. Exercise changes: Moderate to vigorous-intensity aerobic activity 150 minutes per week if tolerated. Lipid-lowering medications: see documented in medical record.  - Lipid panel - LDL cholesterol, direct  3. Depressed mood with emotional eating We will refill Wellbutrin SR for 1 month. Behavior modification techniques were discussed today to help Victoria Garcia deal with her emotional/non-hunger eating behaviors. Orders and follow up as documented in patient record.   - buPROPion ER (WELLBUTRIN SR) 100 MG 12 hr tablet; Take 1 tablet (100 mg total) by mouth  2 (two) times daily.  Dispense: 60 tablet; Refill: 0  4. At risk for constipation Victoria Garcia was given approximately 9 minutes of counseling today regarding prevention of constipation. She was encouraged to increase water and fiber intake.   5. Obesity with current BMI of 30.7 Victoria Garcia is currently in the action stage of change. As such, her goal is to continue with weight loss efforts. She has agreed to the Category 2 Plan.   Exercise goals: As is.  Behavioral modification  strategies: increasing lean protein intake, decreasing simple carbohydrates, and planning for success.  Victoria Garcia has agreed to follow-up with our clinic in 2 to 3 weeks with [name], PA-C. She was informed of the importance of frequent follow-up visits to maximize her success with intensive lifestyle modifications for her multiple health conditions.   Victoria Garcia was informed we would discuss her lab results at her next visit unless there is a critical issue that needs to be addressed sooner. Victoria Garcia agreed to keep her next visit at the agreed upon time to discuss these results.  Objective:   Blood pressure 124/79, pulse 77, temperature 98.3 F (36.8 C), height 5\' 4"  (1.626 m), weight 179 lb (81.2 kg), SpO2 99 %. Body mass index is 30.73 kg/m.  General: Cooperative, alert, well developed, in no acute distress. HEENT: Conjunctivae and lids unremarkable. Cardiovascular: Regular rhythm.  Lungs: Normal work of breathing. Neurologic: No focal deficits.   Lab Results  Component Value Date   CREATININE 0.73 02/17/2021   BUN 18 02/17/2021   NA 139 02/17/2021   K 4.4 02/17/2021   CL 102 02/17/2021   CO2 30 02/17/2021   Lab Results  Component Value Date   ALT 20 02/17/2021   AST 17 02/17/2021   ALKPHOS 92 01/13/2017   BILITOT 0.6 02/17/2021   Lab Results  Component Value Date   HGBA1C 6.0 (H) 06/17/2021   HGBA1C 6.0 (H) 01/04/2021   HGBA1C 5.9 (H) 08/17/2020   HGBA1C 6.1 (H) 03/30/2020   HGBA1C 6.2 (H) 12/16/2019   Lab Results  Component Value Date   INSULIN 14.3 10/27/2020   Lab Results  Component Value Date   TSH 0.859 10/27/2020   Lab Results  Component Value Date   CHOL 143 06/17/2021   HDL 40 06/17/2021   LDLCALC 89 06/17/2021   LDLDIRECT 85 06/17/2021   TRIG 71 06/17/2021   CHOLHDL 3.6 06/17/2021   Lab Results  Component Value Date   VD25OH 54 08/17/2020   VD25OH 58 08/05/2019   VD25OH 42 08/01/2017   Lab Results  Component Value Date   WBC 7.0  08/17/2020   HGB 13.5 08/17/2020   HCT 40.6 08/17/2020   MCV 90.6 08/17/2020   PLT 222 08/17/2020   Lab Results  Component Value Date   IRON 50 09/24/2014   Attestation Statements:   Reviewed by clinician on day of visit: allergies, medications, problem list, medical history, surgical history, family history, social history, and previous encounter notes.   Wilhemena Durie, am acting as transcriptionist for [employer], DO.  I have reviewed the above documentation for accuracy and completeness, and I agree with the above. Marjory Sneddon, D.O.  The [place] was signed into law in 2016 which includes the topic of electronic health records.  This provides immediate access to information in MyChart.  This includes consultation notes, operative notes, office notes, lab results and pathology reports.  If you have any questions about what you read please let  us know at your next visit so we can discuss your concerns and take corrective action if need be.  We are right here with you.

## 2021-07-02 ENCOUNTER — Other Ambulatory Visit (HOSPITAL_COMMUNITY): Payer: Self-pay

## 2021-07-06 ENCOUNTER — Other Ambulatory Visit (HOSPITAL_COMMUNITY): Payer: Self-pay

## 2021-07-06 ENCOUNTER — Encounter (INDEPENDENT_AMBULATORY_CARE_PROVIDER_SITE_OTHER): Payer: Self-pay | Admitting: Physician Assistant

## 2021-07-06 ENCOUNTER — Other Ambulatory Visit: Payer: Self-pay

## 2021-07-06 ENCOUNTER — Ambulatory Visit (INDEPENDENT_AMBULATORY_CARE_PROVIDER_SITE_OTHER): Payer: No Typology Code available for payment source | Admitting: Physician Assistant

## 2021-07-06 VITALS — BP 121/72 | HR 70 | Temp 98.2°F | Ht 64.0 in | Wt 177.0 lb

## 2021-07-06 DIAGNOSIS — E669 Obesity, unspecified: Secondary | ICD-10-CM

## 2021-07-06 DIAGNOSIS — R4589 Other symptoms and signs involving emotional state: Secondary | ICD-10-CM | POA: Diagnosis not present

## 2021-07-06 DIAGNOSIS — Z6830 Body mass index (BMI) 30.0-30.9, adult: Secondary | ICD-10-CM

## 2021-07-06 DIAGNOSIS — Z9189 Other specified personal risk factors, not elsewhere classified: Secondary | ICD-10-CM

## 2021-07-06 DIAGNOSIS — R7303 Prediabetes: Secondary | ICD-10-CM

## 2021-07-06 MED ORDER — SEMAGLUTIDE (2 MG/DOSE) 8 MG/3ML ~~LOC~~ SOPN
2.0000 mg | PEN_INJECTOR | SUBCUTANEOUS | 0 refills | Status: DC
  Filled 2021-07-06: qty 3, 28d supply, fill #0

## 2021-07-06 MED ORDER — BUPROPION HCL ER (SR) 100 MG PO TB12
100.0000 mg | ORAL_TABLET | Freq: Two times a day (BID) | ORAL | 0 refills | Status: DC
  Filled 2021-07-06: qty 60, 30d supply, fill #0

## 2021-07-06 NOTE — Progress Notes (Signed)
Chief Complaint:   OBESITY Victoria Garcia is here to discuss her progress with her obesity treatment plan along with follow-up of her obesity related diagnoses. Victoria Garcia is on the Category 2 Plan and states she is following her eating plan approximately 70% of the time. Victoria Garcia states she is walking 30 minutes 3 times per week.  Today's visit was #: 14 Starting weight: 200 lbs Starting date: 10/27/2020 Today's weight: 177 lbs Today's date: 07/06/2021 Total lbs lost to date: 23 Total lbs lost since last in-office visit: 1  Interim History: Pt is doing well with meal prep and planning. She reports getting in all of her food and denies excessive hunger.  Subjective:   1. Pre-diabetes Discussed labs with patient today. Victoria Garcia's last A1c was 6.0, unchanged. She is on Ozempic 2 mg.  2. Depressed mood with emotional eating Mood stable. Pt reports Wellbutrin helps with cravings.  3. At risk for diabetes mellitus Victoria Garcia is at higher than average risk for developing diabetes due to her obesity.  Assessment/Plan:   1. Pre-diabetes Victoria Garcia will continue to work on weight loss, exercise, and decreasing simple carbohydrates to help decrease the risk of diabetes.   Refill- Semaglutide, 2 MG/DOSE, 8 MG/3ML SOPN; Inject 2 mg into the skin as directed once a week.  Dispense: 3 mL; Refill: 0  2. Depressed mood with emotional eating Behavior modification techniques were discussed today to help Victoria Garcia deal with her emotional/non-hunger eating behaviors.  Orders and follow up as documented in patient record.   Refill- buPROPion ER (WELLBUTRIN SR) 100 MG 12 hr tablet; Take 1 tablet (100 mg total) by mouth 2 (two) times daily.  Dispense: 60 tablet; Refill: 0  3. At risk for diabetes mellitus Victoria Garcia was given approximately 15 minutes of diabetic education and counseling today. We discussed intensive lifestyle modifications today with an emphasis on weight loss as well as increasing exercise and decreasing  simple carbohydrates in her diet. We also reviewed medication options with an emphasis on risk versus benefits of those discussed.  Repetitive spaced learning was employed today to elicit superior memory formation and behavioral change.  4. Obesity with current BMI of 30.37 Victoria Garcia is currently in the action stage of change. As such, her goal is to continue with weight loss efforts. She has agreed to the Category 2 Plan.   Exercise goals:  Add strength training twice a week.  Behavioral modification strategies: meal planning and cooking strategies.  Victoria Garcia has agreed to follow-up with our clinic in 2 weeks. She was informed of the importance of frequent follow-up visits to maximize her success with intensive lifestyle modifications for her multiple health conditions.   Objective:   Blood pressure 121/72, pulse 70, temperature 98.2 F (36.8 C), height 5\' 4"  (1.626 m), weight 177 lb (80.3 kg), SpO2 99 %. Body mass index is 30.38 kg/m.  General: Cooperative, alert, well developed, in no acute distress. HEENT: Conjunctivae and lids unremarkable. Cardiovascular: Regular rhythm.  Lungs: Normal work of breathing. Neurologic: No focal deficits.   Lab Results  Component Value Date   CREATININE 0.73 02/17/2021   BUN 18 02/17/2021   NA 139 02/17/2021   K 4.4 02/17/2021   CL 102 02/17/2021   CO2 30 02/17/2021   Lab Results  Component Value Date   ALT 20 02/17/2021   AST 17 02/17/2021   ALKPHOS 92 01/13/2017   BILITOT 0.6 02/17/2021   Lab Results  Component Value Date   HGBA1C 6.0 (H) 06/17/2021   HGBA1C  6.0 (H) 01/04/2021   HGBA1C 5.9 (H) 08/17/2020   HGBA1C 6.1 (H) 03/30/2020   HGBA1C 6.2 (H) 12/16/2019   Lab Results  Component Value Date   INSULIN 14.3 10/27/2020   Lab Results  Component Value Date   TSH 0.859 10/27/2020   Lab Results  Component Value Date   CHOL 143 06/17/2021   HDL 40 06/17/2021   LDLCALC 89 06/17/2021   LDLDIRECT 85 06/17/2021   TRIG 71  06/17/2021   CHOLHDL 3.6 06/17/2021   Lab Results  Component Value Date   VD25OH 54 08/17/2020   VD25OH 58 08/05/2019   VD25OH 42 08/01/2017   Lab Results  Component Value Date   WBC 7.0 08/17/2020   HGB 13.5 08/17/2020   HCT 40.6 08/17/2020   MCV 90.6 08/17/2020   PLT 222 08/17/2020   Lab Results  Component Value Date   IRON 50 09/24/2014   Attestation Statements:   Reviewed by clinician on day of visit: allergies, medications, problem list, medical history, surgical history, family history, social history, and previous encounter notes.  [name], CMA, am acting as transcriptionist for [employer], PA-C.  I have reviewed the above documentation for accuracy and completeness, and I agree with the above. Alois Cliche, PA-C

## 2021-07-20 ENCOUNTER — Other Ambulatory Visit (HOSPITAL_COMMUNITY): Payer: Self-pay

## 2021-07-20 MED ORDER — CLINDAMYCIN HCL 300 MG PO CAPS
300.0000 mg | ORAL_CAPSULE | Freq: Four times a day (QID) | ORAL | 0 refills | Status: DC
  Filled 2021-07-20: qty 40, 10d supply, fill #0

## 2021-07-22 ENCOUNTER — Other Ambulatory Visit: Payer: Self-pay

## 2021-07-22 ENCOUNTER — Other Ambulatory Visit (HOSPITAL_COMMUNITY): Payer: Self-pay

## 2021-07-22 ENCOUNTER — Ambulatory Visit (INDEPENDENT_AMBULATORY_CARE_PROVIDER_SITE_OTHER): Payer: No Typology Code available for payment source | Admitting: Family Medicine

## 2021-07-22 ENCOUNTER — Encounter (INDEPENDENT_AMBULATORY_CARE_PROVIDER_SITE_OTHER): Payer: Self-pay | Admitting: Family Medicine

## 2021-07-22 VITALS — BP 113/73 | HR 98 | Temp 98.6°F | Ht 64.0 in | Wt 175.0 lb

## 2021-07-22 DIAGNOSIS — Z6830 Body mass index (BMI) 30.0-30.9, adult: Secondary | ICD-10-CM

## 2021-07-22 DIAGNOSIS — R7303 Prediabetes: Secondary | ICD-10-CM | POA: Diagnosis not present

## 2021-07-22 DIAGNOSIS — R4589 Other symptoms and signs involving emotional state: Secondary | ICD-10-CM | POA: Diagnosis not present

## 2021-07-22 DIAGNOSIS — E7849 Other hyperlipidemia: Secondary | ICD-10-CM | POA: Diagnosis not present

## 2021-07-22 DIAGNOSIS — Z9189 Other specified personal risk factors, not elsewhere classified: Secondary | ICD-10-CM

## 2021-07-22 DIAGNOSIS — Z6834 Body mass index (BMI) 34.0-34.9, adult: Secondary | ICD-10-CM

## 2021-07-22 DIAGNOSIS — E669 Obesity, unspecified: Secondary | ICD-10-CM | POA: Diagnosis not present

## 2021-07-22 MED ORDER — SEMAGLUTIDE (2 MG/DOSE) 8 MG/3ML ~~LOC~~ SOPN
2.0000 mg | PEN_INJECTOR | SUBCUTANEOUS | 0 refills | Status: DC
  Filled 2021-07-22 – 2021-08-02 (×2): qty 3, 28d supply, fill #0

## 2021-07-22 MED ORDER — BUPROPION HCL ER (SR) 100 MG PO TB12
100.0000 mg | ORAL_TABLET | Freq: Two times a day (BID) | ORAL | 0 refills | Status: DC
  Filled 2021-07-22 – 2021-08-02 (×2): qty 60, 30d supply, fill #0

## 2021-07-26 NOTE — Progress Notes (Signed)
? ? ? ?Chief Complaint:  ? ?OBESITY ?Victoria Garcia is here to discuss her progress with her obesity treatment plan along with follow-up of her obesity related diagnoses. Nature is on the Category 2 Plan and states she is following her eating plan approximately 70-75% of the time. Victoria Garcia states she is exercising and walking  for 30 minutes 2 times per week. ? ?Today's visit was #: 15 ?Starting weight: 200 lbs ?Starting date: 10/27/2020 ?Today's weight: 175 lbs ?Today's date: 07/22/2021 ?Total lbs lost to date: 25 ?Total lbs lost since last in-office visit: 2 ? ?Interim History: Victoria Garcia is here for a follow up office visit. We reviewed her meal plan and questions were answered. Patient's food recall appears to be accurate and consistent with what is on plan when she is following it. When eating on plan, her hunger and cravings are well controlled.  ? ?Victoria Garcia was recently diagnosed with a tooth fracture that's infected now.  ? ?No emotional eating and she feels well overall.  ? ?Not walking as much do to more work and longer hours.  ? ? ?Subjective:  ? ?1. Other hyperlipidemia ?Victoria Garcia has hyperlipidemia and she is on Lipitor. She has been trying to improve her cholesterol levels with intensive lifestyle modifications including a low saturated fat diet, exercise, and weight loss. She denies any chest pain, claudication, or myalgias. ? ?2. Pre-diabetes ?Victoria Garcia's A1c was 6.0 recently. She denies cravings or hunger. Asx ? ?3. Depressed mood with emotional eating ?Victoria Garcia is doing well on her bupropion 100 mg BID, same dose since early October or sooner. No emotional eating episodes.  ? ?4. At risk for impaired metabolic function ?Victoria Garcia is at increased risk for impaired metabolic function due to pre-diabetes. ? ? ? ?Assessment/Plan:  ?No orders of the defined types were placed in this encounter. ? ? ?Medications Discontinued During This Encounter  ?Medication Reason  ? buPROPion ER (WELLBUTRIN SR) 100 MG 12 hr tablet Reorder   ? Semaglutide, 2 MG/DOSE, 8 MG/3ML SOPN Reorder  ?  ? ?Meds ordered this encounter  ?Medications  ? buPROPion ER (WELLBUTRIN SR) 100 MG 12 hr tablet  ?  Sig: Take 1 tablet (100 mg total) by mouth 2 (two) times daily.  ?  Dispense:  60 tablet  ?  Refill:  0  ?  Ov for rf  ? Semaglutide, 2 MG/DOSE, 8 MG/3ML SOPN  ?  Sig: Inject 2 mg into the skin as directed once a week.  ?  Dispense:  3 mL  ?  Refill:  0  ?  ? ?1. Other hyperlipidemia ?Victoria Garcia's cholesterol panel recently at goal. Labs were reviewed and HDL finally within normal limits at 40. She will continue her prudent nutritional plan, medications, and increase activity. Orders and follow up as documented in patient record.  ? ?Counseling ?Intensive lifestyle modifications are the first line treatment for this issue. ?Dietary changes: Increase soluble fiber. Decrease simple carbohydrates. ?Exercise changes: Moderate to vigorous-intensity aerobic activity 150 minutes per week if tolerated. ?Lipid-lowering medications: see documented in medical record. ? ?2. Pre-diabetes ?Labs reviewed and A1c 6.0.  Unchanged from prior. We will refill Ozempic for 1 month. Victoria Garcia will continue her prudent nutritional plan and decreasing simple carbohydrates to help decrease the risk of diabetes.  ? ?- Semaglutide, 2 MG/DOSE, 8 MG/3ML SOPN; Inject 2 mg into the skin as directed once a week.  Dispense: 3 mL; Refill: 0 ? ?3. Depressed mood with emotional eating ?We will refill bupropion for 1 month  at the same dose. Victoria Garcia will increase walking and self-care activities and setting boundaries. Orders and follow up as documented in patient record.  ? ?- buPROPion ER (WELLBUTRIN SR) 100 MG 12 hr tablet; Take 1 tablet (100 mg total) by mouth 2 (two) times daily.  Dispense: 60 tablet; Refill: 0 ? ?4. At risk for impaired metabolic function ?Victoria Garcia was given approximately 10 minutes of impaired  metabolic function prevention counseling today. We discussed intensive lifestyle modifications  today with an emphasis on specific nutrition and exercise instructions and strategies.  ? ?Repetitive spaced learning was employed today to elicit superior memory formation and behavioral change.  ? ?5. Obesity with current BMI of 30.1 ?Victoria Garcia is currently in the action stage of change. As such, her goal is to continue with weight loss efforts. She has agreed to the Category 2 Plan.  ? ?Exercise goals: As is. ? ?Behavioral modification strategies: increasing lean protein intake, decreasing simple carbohydrates, and planning for success. ? ?Victoria Garcia has agreed to follow-up with our clinic in 3 weeks. She was informed of the importance of frequent follow-up visits to maximize her success with intensive lifestyle modifications for her multiple health conditions.  ? ? ?Objective:  ? ?Blood pressure 113/73, pulse 98, temperature 98.6 ?F (37 ?C), height 5\' 4"  (1.626 m), weight 175 lb (79.4 kg), SpO2 98 %. ?Body mass index is 30.04 kg/m?. ? ?General: Cooperative, alert, well developed, in no acute distress. ?HEENT: Conjunctivae and lids unremarkable. ?Cardiovascular: Regular rhythm.  ?Lungs: Normal work of breathing. ?Neurologic: No focal deficits.  ? ?Lab Results  ?Component Value Date  ? CREATININE 0.73 02/17/2021  ? BUN 18 02/17/2021  ? NA 139 02/17/2021  ? K 4.4 02/17/2021  ? CL 102 02/17/2021  ? CO2 30 02/17/2021  ? ?Lab Results  ?Component Value Date  ? ALT 20 02/17/2021  ? AST 17 02/17/2021  ? ALKPHOS 92 01/13/2017  ? BILITOT 0.6 02/17/2021  ? ?Lab Results  ?Component Value Date  ? HGBA1C 6.0 (H) 06/17/2021  ? HGBA1C 6.0 (H) 01/04/2021  ? HGBA1C 5.9 (H) 08/17/2020  ? HGBA1C 6.1 (H) 03/30/2020  ? HGBA1C 6.2 (H) 12/16/2019  ? ?Lab Results  ?Component Value Date  ? INSULIN 14.3 10/27/2020  ? ?Lab Results  ?Component Value Date  ? TSH 0.859 10/27/2020  ? ?Lab Results  ?Component Value Date  ? CHOL 143 06/17/2021  ? HDL 40 06/17/2021  ? LDLCALC 89 06/17/2021  ? LDLDIRECT 85 06/17/2021  ? TRIG 71 06/17/2021  ? CHOLHDL 3.6  06/17/2021  ? ?Lab Results  ?Component Value Date  ? VD25OH 54 08/17/2020  ? VD25OH 58 08/05/2019  ? VD25OH 42 08/01/2017  ? ?Lab Results  ?Component Value Date  ? WBC 7.0 08/17/2020  ? HGB 13.5 08/17/2020  ? HCT 40.6 08/17/2020  ? MCV 90.6 08/17/2020  ? PLT 222 08/17/2020  ? ?Lab Results  ?Component Value Date  ? IRON 50 09/24/2014  ? ?Attestation Statements:  ? ?Reviewed by clinician on day of visit: allergies, medications, problem list, medical history, surgical history, family history, social history, and previous encounter notes. ? ?I, 11/24/2014, am acting as transcriptionist for Burt Knack, DO. ? ?I have reviewed the above documentation for accuracy and completeness, and I agree with the above. [employer], D.O. ? ?The 21st Century Cures Act was signed into law in 2016 which includes the topic of electronic health records.  This provides immediate access to information in MyChart.  This includes consultation  notes, operative notes, office notes, lab results and pathology reports.  If you have any questions about what you read please let us know at your next visit so we can discuss your concerns and take corrective action if need be.  We are right here with you. ? ? ?

## 2021-08-02 ENCOUNTER — Other Ambulatory Visit: Payer: Self-pay | Admitting: Nurse Practitioner

## 2021-08-02 ENCOUNTER — Other Ambulatory Visit (HOSPITAL_COMMUNITY): Payer: Self-pay

## 2021-08-10 ENCOUNTER — Other Ambulatory Visit: Payer: Self-pay

## 2021-08-10 ENCOUNTER — Encounter (INDEPENDENT_AMBULATORY_CARE_PROVIDER_SITE_OTHER): Payer: Self-pay | Admitting: Family Medicine

## 2021-08-10 ENCOUNTER — Other Ambulatory Visit (HOSPITAL_COMMUNITY): Payer: Self-pay

## 2021-08-10 ENCOUNTER — Ambulatory Visit (INDEPENDENT_AMBULATORY_CARE_PROVIDER_SITE_OTHER): Payer: No Typology Code available for payment source | Admitting: Family Medicine

## 2021-08-10 VITALS — BP 119/78 | HR 76 | Temp 98.3°F | Ht 64.0 in | Wt 175.0 lb

## 2021-08-10 DIAGNOSIS — E669 Obesity, unspecified: Secondary | ICD-10-CM | POA: Diagnosis not present

## 2021-08-10 DIAGNOSIS — R7303 Prediabetes: Secondary | ICD-10-CM | POA: Diagnosis not present

## 2021-08-10 DIAGNOSIS — Z6830 Body mass index (BMI) 30.0-30.9, adult: Secondary | ICD-10-CM

## 2021-08-10 DIAGNOSIS — R4589 Other symptoms and signs involving emotional state: Secondary | ICD-10-CM | POA: Diagnosis not present

## 2021-08-10 DIAGNOSIS — Z9189 Other specified personal risk factors, not elsewhere classified: Secondary | ICD-10-CM

## 2021-08-10 DIAGNOSIS — E7849 Other hyperlipidemia: Secondary | ICD-10-CM | POA: Diagnosis not present

## 2021-08-10 MED ORDER — SEMAGLUTIDE (2 MG/DOSE) 8 MG/3ML ~~LOC~~ SOPN
2.0000 mg | PEN_INJECTOR | SUBCUTANEOUS | 0 refills | Status: DC
  Filled 2021-08-10 – 2021-09-03 (×2): qty 3, 28d supply, fill #0

## 2021-08-10 MED ORDER — BUPROPION HCL ER (SR) 150 MG PO TB12
150.0000 mg | ORAL_TABLET | Freq: Two times a day (BID) | ORAL | 0 refills | Status: DC
  Filled 2021-08-10: qty 60, 30d supply, fill #0

## 2021-08-10 MED ORDER — ATORVASTATIN CALCIUM 40 MG PO TABS
40.0000 mg | ORAL_TABLET | Freq: Every day | ORAL | 0 refills | Status: DC
  Filled 2021-08-10 – 2021-09-03 (×2): qty 90, 90d supply, fill #0

## 2021-08-13 ENCOUNTER — Other Ambulatory Visit (HOSPITAL_COMMUNITY): Payer: Self-pay

## 2021-08-18 ENCOUNTER — Other Ambulatory Visit (HOSPITAL_COMMUNITY): Payer: Self-pay

## 2021-08-18 MED ORDER — IBUPROFEN 400 MG PO TABS
400.0000 mg | ORAL_TABLET | ORAL | 0 refills | Status: DC | PRN
  Filled 2021-08-18: qty 30, 5d supply, fill #0

## 2021-08-18 MED ORDER — HYDROCODONE-ACETAMINOPHEN 5-325 MG PO TABS
1.0000 | ORAL_TABLET | ORAL | 0 refills | Status: DC
  Filled 2021-08-18: qty 5, 2d supply, fill #0

## 2021-08-18 MED ORDER — CLINDAMYCIN HCL 300 MG PO CAPS
300.0000 mg | ORAL_CAPSULE | Freq: Three times a day (TID) | ORAL | 0 refills | Status: DC
  Filled 2021-08-18: qty 15, 5d supply, fill #0

## 2021-08-18 NOTE — Progress Notes (Signed)
? ? ? ?Chief Complaint:  ? ?OBESITY ?Victoria Garcia is here to discuss her progress with her obesity treatment plan along with follow-up of her obesity related diagnoses. Victoria Garcia is on the Category 2 Plan and states she is following her eating plan approximately 65% of the time. Victoria Garcia states she is not exercising regularly at this time. ? ?Today's visit was #: 16 ?Starting weight: 200 lbs ?Starting date: 10/27/2020 ?Today's weight: 175 lbs ?Today's date: 08/10/2021 ?Total lbs lost to date: 25 lbs ?Total lbs lost since last in-office visit: 0 ? ?Interim History: Victoria Garcia has increased her activity level and parks further away at work.  Work is stressful and she feels she may need an adjustment to her medications. ? ?Subjective:  ? ?1. Pre-diabetes ?Denies cravings or hunger.  Victoria Garcia is tolerating medication(s) well without side effects.  Medication compliance is good and patient appears to be taking it as prescribed.  Denies additional concerns regarding this condition.  No side effects.  Denies constipation.  Drinking 60-70 oz/day. ? ?2. Other hyperlipidemia ?Victoria Garcia is tolerating medication(s) well without side effects.  Medication compliance is good and patient appears to be taking it as prescribed.  Denies additional concerns regarding this condition.  ? ?3. Depressed mood with emotional eating ?Denies SI.  Endorses increased irritability and agitation at work.  Feeling overwhelmed a bit. ? ?4. At risk for side effect of medication ?Victoria Garcia is at risk for medication side effect due to increasing her Wellbutrin dose today. ? ?Assessment/Plan:  ?No orders of the defined types were placed in this encounter. ? ? ?Medications Discontinued During This Encounter  ?Medication Reason  ? cephALEXin (KEFLEX) 500 MG capsule   ? buPROPion ER (WELLBUTRIN SR) 100 MG 12 hr tablet Reorder  ? Semaglutide, 2 MG/DOSE, 8 MG/3ML SOPN Reorder  ? atorvastatin (LIPITOR) 40 MG tablet Reorder  ?  ? ?Meds ordered this encounter   ?Medications  ? buPROPion (WELLBUTRIN SR) 150 MG 12 hr tablet  ?  Sig: Take 1 tablet (150 mg total) by mouth 2 (two) times daily.  ?  Dispense:  60 tablet  ?  Refill:  0  ?  Ov for rf  ? Semaglutide, 2 MG/DOSE, 8 MG/3ML SOPN  ?  Sig: Inject 2 mg into the skin as directed once a week.  ?  Dispense:  3 mL  ?  Refill:  0  ? atorvastatin (LIPITOR) 40 MG tablet  ?  Sig: Take 1 tablet (40 mg total) by mouth at bedtime.  ?  Dispense:  90 tablet  ?  Refill:  0  ?  ? ?1. Pre-diabetes ?Refill Ozempic 2 mg. ?Continue prudent nutritional plan, decrease simple carbs, increase fiber and protein. ? ?- Refill Semaglutide, 2 MG/DOSE, 8 MG/3ML SOPN; Inject 2 mg into the skin as directed once a week.  Dispense: 3 mL; Refill: 0 ? ?2. Other hyperlipidemia ?Refill Lipitor. ?Continue prudent nutritional plan with low sat/trans fat. ? ?- Refill atorvastatin (LIPITOR) 40 MG tablet; Take 1 tablet (40 mg total) by mouth at bedtime.  Dispense: 90 tablet; Refill: 0 ? ?3. Depressed mood with emotional eating ?Contact [facility] for counseling.  Increase Wellbutrin from 100 mg SR BID to 150 mg SR BID.  Increase exercise. ? ?- Increase buPROPion (WELLBUTRIN SR) 150 MG 12 hr tablet; Take 1 tablet (150 mg total) by mouth 2 (two) times daily.  Dispense: 60 tablet; Refill: 0 ? ?4. At risk for side effect of medication ?Due to Victoria Garcia's current conditions  and medications, she is at a higher risk for drug side effect.  At least 10 minutes was spent on counseling her about these concerns today.  We discussed the benefits and potential risks of these medications, and all of patient's concerns were addressed and questions were answered.  she will call Victoria Garcia, or their PCP or other specialists who treat their conditions with medications, with any questions or concerns that may develop.   ?  ?5. Obesity with current BMI of 30.1 ? ?Victoria Garcia is currently in the action stage of change. As such, her goal is to continue with weight loss efforts. She has agreed to the Category  2 Plan.  ? ?Exercise goals: For substantial health benefits, adults should do at least 150 minutes (2 hours and 30 minutes) a week of moderate-intensity, or 75 minutes (1 hour and 15 minutes) a week of vigorous-intensity aerobic physical activity, or an equivalent combination of moderate- and vigorous-intensity aerobic activity. Aerobic activity should be performed in episodes of at least 10 minutes, and preferably, it should be spread throughout the week. Start walking regularly again. ? ?Behavioral modification strategies: increasing lean protein intake, no skipping meals, and emotional eating strategies. ? ?Victoria Garcia has agreed to follow-up with our clinic in 3 weeks. She was informed of the importance of frequent follow-up visits to maximize her success with intensive lifestyle modifications for her multiple health conditions.  ? ?Objective:  ? ?Blood pressure 119/78, pulse 76, temperature 98.3 ?F (36.8 ?C), height 5\' 4"  (1.626 m), weight 175 lb (79.4 kg), SpO2 100 %. ?Body mass index is 30.04 kg/m?. ? ?General: Cooperative, alert, well developed, in no acute distress. ?HEENT: Conjunctivae and lids unremarkable. ?Cardiovascular: Regular rhythm.  ?Lungs: Normal work of breathing. ?Neurologic: No focal deficits.  ? ?Lab Results  ?Component Value Date  ? CREATININE 0.73 02/17/2021  ? BUN 18 02/17/2021  ? NA 139 02/17/2021  ? K 4.4 02/17/2021  ? CL 102 02/17/2021  ? CO2 30 02/17/2021  ? ?Lab Results  ?Component Value Date  ? ALT 20 02/17/2021  ? AST 17 02/17/2021  ? ALKPHOS 92 01/13/2017  ? BILITOT 0.6 02/17/2021  ? ?Lab Results  ?Component Value Date  ? HGBA1C 6.0 (H) 06/17/2021  ? HGBA1C 6.0 (H) 01/04/2021  ? HGBA1C 5.9 (H) 08/17/2020  ? HGBA1C 6.1 (H) 03/30/2020  ? HGBA1C 6.2 (H) 12/16/2019  ? ?Lab Results  ?Component Value Date  ? INSULIN 14.3 10/27/2020  ? ?Lab Results  ?Component Value Date  ? TSH 0.859 10/27/2020  ? ?Lab Results  ?Component Value Date  ? CHOL 143 06/17/2021  ? HDL 40 06/17/2021  ? [place] 89  06/17/2021  ? LDLDIRECT 85 06/17/2021  ? TRIG 71 06/17/2021  ? CHOLHDL 3.6 06/17/2021  ? ?Lab Results  ?Component Value Date  ? VD25OH 54 08/17/2020  ? VD25OH 58 08/05/2019  ? VD25OH 42 08/01/2017  ? ?Lab Results  ?Component Value Date  ? WBC 7.0 08/17/2020  ? HGB 13.5 08/17/2020  ? HCT 40.6 08/17/2020  ? MCV 90.6 08/17/2020  ? PLT 222 08/17/2020  ? ?Lab Results  ?Component Value Date  ? IRON 50 09/24/2014  ? ?Attestation Statements:  ? ?Reviewed by clinician on day of visit: allergies, medications, problem list, medical history, surgical history, family history, social history, and previous encounter notes. ? ?I, Water quality scientist, CMA, am acting as transcriptionist for [employer], DO. ? ?I have reviewed the above documentation for accuracy and completeness, and I agree with the above. Neoma Laming  J Derra Shartzer, D.O. ? ?The [place] was signed into law in 2016 which includes the topic of electronic health records.  This provides immediate access to information in MyChart.  This includes consultation notes, operative notes, office notes, lab results and pathology reports.  If you have any questions about what you read please let Victoria Garcia know at your next visit so we can discuss your concerns and take corrective action if need be.  We are right here with you. ? ?

## 2021-08-19 ENCOUNTER — Encounter: Payer: 59 | Admitting: Nurse Practitioner

## 2021-09-03 ENCOUNTER — Other Ambulatory Visit (HOSPITAL_COMMUNITY): Payer: Self-pay

## 2021-09-06 ENCOUNTER — Ambulatory Visit (INDEPENDENT_AMBULATORY_CARE_PROVIDER_SITE_OTHER): Payer: No Typology Code available for payment source | Admitting: Family Medicine

## 2021-09-06 ENCOUNTER — Encounter (INDEPENDENT_AMBULATORY_CARE_PROVIDER_SITE_OTHER): Payer: Self-pay | Admitting: Family Medicine

## 2021-09-06 ENCOUNTER — Other Ambulatory Visit (HOSPITAL_COMMUNITY): Payer: Self-pay

## 2021-09-06 VITALS — BP 118/67 | HR 74 | Temp 98.0°F | Ht 64.0 in | Wt 171.0 lb

## 2021-09-06 DIAGNOSIS — Z6829 Body mass index (BMI) 29.0-29.9, adult: Secondary | ICD-10-CM

## 2021-09-06 DIAGNOSIS — R7303 Prediabetes: Secondary | ICD-10-CM

## 2021-09-06 DIAGNOSIS — R4589 Other symptoms and signs involving emotional state: Secondary | ICD-10-CM

## 2021-09-06 DIAGNOSIS — Z9189 Other specified personal risk factors, not elsewhere classified: Secondary | ICD-10-CM

## 2021-09-06 DIAGNOSIS — E669 Obesity, unspecified: Secondary | ICD-10-CM

## 2021-09-06 MED ORDER — BUPROPION HCL ER (SR) 150 MG PO TB12
150.0000 mg | ORAL_TABLET | Freq: Two times a day (BID) | ORAL | 0 refills | Status: DC
  Filled 2021-09-06: qty 60, 30d supply, fill #0

## 2021-09-06 MED ORDER — SEMAGLUTIDE (2 MG/DOSE) 8 MG/3ML ~~LOC~~ SOPN
2.0000 mg | PEN_INJECTOR | SUBCUTANEOUS | 0 refills | Status: DC
  Filled 2021-09-06 – 2021-09-20 (×4): qty 3, 28d supply, fill #0

## 2021-09-06 NOTE — Telephone Encounter (Signed)
LOV w/ Dr. O

## 2021-09-08 ENCOUNTER — Other Ambulatory Visit (HOSPITAL_COMMUNITY): Payer: Self-pay

## 2021-09-08 ENCOUNTER — Telehealth (INDEPENDENT_AMBULATORY_CARE_PROVIDER_SITE_OTHER): Payer: Self-pay | Admitting: Family Medicine

## 2021-09-08 NOTE — Telephone Encounter (Signed)
Dr.Opalski, this patient has [employer]. [facility] is no longer covering Ozempic if the patient does not have type 2 diabetes, no exceptions to this new rule, they must have a dx of type 2 diabetes. I have not submitted the PA, because everyone that I have done since April 1st has been denied. I am not sure if you want to change the medication and notify patient of the change. Thanks!!!! ?

## 2021-09-08 NOTE — Telephone Encounter (Signed)
PA needed

## 2021-09-08 NOTE — Telephone Encounter (Signed)
Patient called in stating the medication is Ozempic.  ?

## 2021-09-09 NOTE — Telephone Encounter (Signed)
Ozempic not covered. Please advise

## 2021-09-09 NOTE — Telephone Encounter (Signed)
Per Dr. Synthia Innocent e-mail in which all staff was included. I am making you aware [facility] insurance does not cover Ozempic for a patient that does not have type 2 diabetes. Please notify Dr. Sharee Holster and she will make changes to medication and notify patient of this change per her request. No prior authorization started for this medication. Thanks. ?

## 2021-09-10 ENCOUNTER — Other Ambulatory Visit (HOSPITAL_COMMUNITY): Payer: Self-pay

## 2021-09-14 NOTE — Telephone Encounter (Signed)
Please advise 

## 2021-09-16 NOTE — Progress Notes (Signed)
? ? ? ?Chief Complaint:  ? ?OBESITY ?Victoria Garcia is here to discuss her progress with her obesity treatment plan along with follow-up of her obesity related diagnoses. Victoria Garcia is on the Category 2 Plan and states she is following her eating plan approximately 60% of the time. Victoria Garcia states she is not currently exercising. ? ?Today's visit was #: 17 ?Starting weight: 200 lbs ?Starting date: 10/27/2020 ?Today's weight: 171 lbs ?Today's date: 09/06/2021 ?Total lbs lost to date: 52 ?Total lbs lost since last in-office visit: 4 ? ?Interim History: Victoria Garcia had a tooth extraction and she could not eat much, and was on a liquid diet for 4-5 days. She did muscle milk 2-3 days. She gained 1 lb in muscle and lost 5 lbs in fat mass. ? ?Subjective:  ? ?1. Pre-diabetes ?Victoria Garcia notes her hunger and cravings are well controlled. She is tolerating Ozempic well with no side effects.  ? ?2. Depressed mood with emotional eating ?Victoria Garcia's mood is stable. Feeling very well considering stress at work etc.  ? ?3. At risk for deficient intake of food ?The patient is at a higher than average risk of deficient intake of food due to decreased intake. ? ?Assessment/Plan:  ?No orders of the defined types were placed in this encounter. ? ? ?Medications Discontinued During This Encounter  ?Medication Reason  ? buPROPion (WELLBUTRIN SR) 150 MG 12 hr tablet Reorder  ? Semaglutide, 2 MG/DOSE, 8 MG/3ML SOPN Reorder  ?  ? ?Meds ordered this encounter  ?Medications  ? buPROPion (WELLBUTRIN SR) 150 MG 12 hr tablet  ?  Sig: Take 1 tablet (150 mg total) by mouth 2 (two) times daily.  ?  Dispense:  60 tablet  ?  Refill:  0  ?  Ov for rf  ? Semaglutide, 2 MG/DOSE, 8 MG/3ML SOPN  ?  Sig: Inject 2 mg into the skin as directed once a week.  ?  Dispense:  3 mL  ?  Refill:  0  ?  ? ?1. Pre-diabetes ?We will refill Ozempic 2 mg for 1 month. Victoria Garcia will continue her prudent nutritional plan. ? ?- Semaglutide, 2 MG/DOSE, 8 MG/3ML SOPN; Inject 2 mg into the skin as directed  once a week.  Dispense: 3 mL; Refill: 0 ? ?2. Depressed mood with emotional eating ?We will refill Wellbutrin SR for 1 month. Victoria Garcia will continue exercise and continue her prudent nutritional plan. She did reach out to a life coach.  ? ?- buPROPion (WELLBUTRIN SR) 150 MG 12 hr tablet; Take 1 tablet (150 mg total) by mouth 2 (two) times daily.  Dispense: 60 tablet; Refill: 0 ? ?3. At risk for deficient intake of food ?Victoria Garcia was given approximately 9 minutes of deficient intake of food prevention counseling today. Victoria Garcia is at risk for eating too few calories based on current food recall. She was encouraged to focus on meeting caloric and protein goals according to her recommended meal plan. ? ?4. Obesity with current BMI of 29.4 ?Victoria Garcia is currently in the action stage of change. As such, her goal is to continue with weight loss efforts. She has agreed to the Category 2 Plan.  ? ?Continues to increase activity, walking 1 mile to and from her car at work.  ? ?Exercise goals: As is. ? ?Behavioral modification strategies: increasing lean protein intake and decreasing simple carbohydrates. ? ?Victoria Garcia has agreed to follow-up with our clinic in 3 to 4 weeks. She was informed of the importance of frequent follow-up visits to maximize her success  with intensive lifestyle modifications for her multiple health conditions.  ? ?Objective:  ? ?Blood pressure 118/67, pulse 74, temperature 98 ?F (36.7 ?C), height 5\' 4"  (1.626 m), weight 171 lb (77.6 kg), SpO2 100 %. ?Body mass index is 29.35 kg/m?. ? ?General: Cooperative, alert, well developed, in no acute distress. ?HEENT: Conjunctivae and lids unremarkable. ?Cardiovascular: Regular rhythm.  ?Lungs: Normal work of breathing. ?Neurologic: No focal deficits.  ? ?Lab Results  ?Component Value Date  ? CREATININE 0.73 02/17/2021  ? BUN 18 02/17/2021  ? NA 139 02/17/2021  ? K 4.4 02/17/2021  ? CL 102 02/17/2021  ? CO2 30 02/17/2021  ? ?Lab Results  ?Component Value Date  ? ALT 20  02/17/2021  ? AST 17 02/17/2021  ? ALKPHOS 92 01/13/2017  ? BILITOT 0.6 02/17/2021  ? ?Lab Results  ?Component Value Date  ? HGBA1C 6.0 (H) 06/17/2021  ? HGBA1C 6.0 (H) 01/04/2021  ? HGBA1C 5.9 (H) 08/17/2020  ? HGBA1C 6.1 (H) 03/30/2020  ? HGBA1C 6.2 (H) 12/16/2019  ? ?Lab Results  ?Component Value Date  ? INSULIN 14.3 10/27/2020  ? ?Lab Results  ?Component Value Date  ? TSH 0.859 10/27/2020  ? ?Lab Results  ?Component Value Date  ? CHOL 143 06/17/2021  ? HDL 40 06/17/2021  ? LDLCALC 89 06/17/2021  ? LDLDIRECT 85 06/17/2021  ? TRIG 71 06/17/2021  ? CHOLHDL 3.6 06/17/2021  ? ?Lab Results  ?Component Value Date  ? VD25OH 54 08/17/2020  ? VD25OH 58 08/05/2019  ? VD25OH 42 08/01/2017  ? ?Lab Results  ?Component Value Date  ? WBC 7.0 08/17/2020  ? HGB 13.5 08/17/2020  ? HCT 40.6 08/17/2020  ? MCV 90.6 08/17/2020  ? PLT 222 08/17/2020  ? ?Lab Results  ?Component Value Date  ? IRON 50 09/24/2014  ? ?Attestation Statements:  ? ?Reviewed by clinician on day of visit: allergies, medications, problem list, medical history, surgical history, family history, social history, and previous encounter notes. ? ? ?I, 11/24/2014, am acting as transcriptionist for Burt Knack, DO. ? ?I have reviewed the above documentation for accuracy and completeness, and I agree with the above. [employer], D.O. ? ?The 21st Century Cures Act was signed into law in 2016 which includes the topic of electronic health records.  This provides immediate access to information in MyChart.  This includes consultation notes, operative notes, office notes, lab results and pathology reports.  If you have any questions about what you read please let 2017 know at your next visit so we can discuss your concerns and take corrective action if need be.  We are right here with you. ? ? ?

## 2021-09-20 ENCOUNTER — Other Ambulatory Visit (HOSPITAL_COMMUNITY): Payer: Self-pay

## 2021-09-28 NOTE — Telephone Encounter (Signed)
Please see message and advise.  Thank you. ° °

## 2021-09-30 ENCOUNTER — Encounter (INDEPENDENT_AMBULATORY_CARE_PROVIDER_SITE_OTHER): Payer: Self-pay | Admitting: Family Medicine

## 2021-09-30 ENCOUNTER — Ambulatory Visit (INDEPENDENT_AMBULATORY_CARE_PROVIDER_SITE_OTHER): Payer: No Typology Code available for payment source | Admitting: Family Medicine

## 2021-09-30 ENCOUNTER — Other Ambulatory Visit (HOSPITAL_COMMUNITY): Payer: Self-pay

## 2021-09-30 VITALS — BP 118/77 | HR 78 | Temp 98.5°F | Ht 64.0 in | Wt 173.0 lb

## 2021-09-30 DIAGNOSIS — Z6829 Body mass index (BMI) 29.0-29.9, adult: Secondary | ICD-10-CM

## 2021-09-30 DIAGNOSIS — R632 Polyphagia: Secondary | ICD-10-CM

## 2021-09-30 DIAGNOSIS — R4589 Other symptoms and signs involving emotional state: Secondary | ICD-10-CM | POA: Diagnosis not present

## 2021-09-30 DIAGNOSIS — E669 Obesity, unspecified: Secondary | ICD-10-CM

## 2021-09-30 DIAGNOSIS — R7303 Prediabetes: Secondary | ICD-10-CM

## 2021-09-30 DIAGNOSIS — Z9189 Other specified personal risk factors, not elsewhere classified: Secondary | ICD-10-CM

## 2021-09-30 MED ORDER — BUPROPION HCL ER (SR) 150 MG PO TB12
150.0000 mg | ORAL_TABLET | Freq: Two times a day (BID) | ORAL | 0 refills | Status: DC
  Filled 2021-09-30: qty 60, 30d supply, fill #0

## 2021-09-30 MED ORDER — SEMAGLUTIDE-WEIGHT MANAGEMENT 0.5 MG/0.5ML ~~LOC~~ SOAJ
0.5000 mg | SUBCUTANEOUS | 0 refills | Status: DC
  Filled 2021-09-30 – 2021-10-08 (×2): qty 2, 30d supply, fill #0
  Filled 2021-10-14: qty 2, 28d supply, fill #0

## 2021-10-07 ENCOUNTER — Encounter (INDEPENDENT_AMBULATORY_CARE_PROVIDER_SITE_OTHER): Payer: Self-pay

## 2021-10-07 ENCOUNTER — Telehealth (INDEPENDENT_AMBULATORY_CARE_PROVIDER_SITE_OTHER): Payer: Self-pay | Admitting: Family Medicine

## 2021-10-07 ENCOUNTER — Other Ambulatory Visit (HOSPITAL_COMMUNITY): Payer: Self-pay

## 2021-10-07 NOTE — Telephone Encounter (Signed)
Dr. Sharee Holster - Prior authorization approved for [id number]. Effective: 10/07/2021 to 05/05/2022. Patient sent approval message via mychart.

## 2021-10-08 ENCOUNTER — Other Ambulatory Visit (HOSPITAL_COMMUNITY): Payer: Self-pay

## 2021-10-09 ENCOUNTER — Other Ambulatory Visit (HOSPITAL_COMMUNITY): Payer: Self-pay

## 2021-10-14 ENCOUNTER — Other Ambulatory Visit (HOSPITAL_COMMUNITY): Payer: Self-pay

## 2021-10-14 NOTE — Progress Notes (Signed)
Chief Complaint:   OBESITY Victoria Garcia is here to discuss her progress with her obesity treatment plan along with follow-up of her obesity related diagnoses. Victoria Garcia is on the Category 2 Plan and states she is following her eating plan approximately 50% of the time. Victoria Garcia states she is walking 30 minutes 2 times per week.  Today's visit was #: 18 Starting weight: 200 lbs Starting date: 10/27/2020 Today's weight: 173 lbs Today's date: 09/30/2021 Total lbs lost to date: 27 Total lbs lost since last in-office visit: +2  Interim History: Pt has been off Ozempic for 4 weeks due to insurance coverage. She reports increased hunger and cravings, especially around 2 pm. She has yogurt and cheese sticks and/or a protein shake, but it does not hold her over so she has been snacking more on chips and cracker before dinner.  Subjective:   1. Polyphagia Victoria Garcia is now off Ozempic. She has not contraindications to using GLP-1.  2. Depressed mood with emotional eating Pt is seeing EAP counselor once a month. She is not sure it is helping much, if at all.  3. Pre-diabetes Three months ago, pt's A1c was 6.0. She has had increased cravings due to increased emotional stress at work.  4. At risk for impaired metabolic function Victoria Garcia is at increased risk for impaired metabolic function due to prediabetes.  Assessment/Plan:  No orders of the defined types were placed in this encounter.   Medications Discontinued During This Encounter  Medication Reason   Semaglutide, 2 MG/DOSE, 8 MG/3ML SOPN Not covered by the pt's insurance   buPROPion (WELLBUTRIN SR) 150 MG 12 hr tablet Reorder     Meds ordered this encounter  Medications   buPROPion (WELLBUTRIN SR) 150 MG 12 hr tablet    Sig: Take 1 tablet (150 mg total) by mouth 2 (two) times daily.    Dispense:  60 tablet    Refill:  0    Ov for rf   Semaglutide-Weight Management 0.5 MG/0.5ML SOAJ    Sig: Inject 0.5 mg into the skin once a week.     Dispense:  2 mL    Refill:  0     1. Polyphagia Intensive lifestyle modifications are the first line treatment for this issue. We discussed several lifestyle modifications today and she will continue to work on diet, exercise and weight loss efforts. Orders and follow up as documented in patient record. Start Wegovy 0.5 mg weekly and titrate up as tolerated. Risks and benefits discussed with pt. Follow prudent nutritional plan.  Counseling Polyphagia is excessive hunger. Causes can include: low blood sugars, hypERthyroidism, PMS, lack of sleep, stress, insulin resistance, diabetes, certain medications, and diets that are deficient in protein and fiber.   Start- Semaglutide-Weight Management 0.5 MG/0.5ML SOAJ; Inject 0.5 mg into the skin once a week.  Dispense: 2 mL; Refill: 0  2. Depressed mood with emotional eating Behavior modification techniques were discussed today to help Victoria Garcia deal with her emotional/non-hunger eating behaviors.  Orders and follow up as documented in patient record. Continue Wellbutrin with no change in dose, as pt declines increase at this time.  Refill- buPROPion (WELLBUTRIN SR) 150 MG 12 hr tablet; Take 1 tablet (150 mg total) by mouth 2 (two) times daily.  Dispense: 60 tablet; Refill: 0  3. Pre-diabetes Victoria Garcia will continue to work on weight loss, exercise, and decreasing simple carbohydrates to help decrease the risk of diabetes. Plan to repeat A1c and other labs in the near future.  4. At risk for impaired metabolic function Due to Victoria Garcia's current state of health and medical condition(s), she is at a significantly higher risk for impaired metabolic function. At least 9 minutes was spent on counseling Victoria Garcia about these concerns today. This places the patient at a much greater risk to subsequently develop cardio-pulmonary conditions that can negatively affect the patient's quality of life. I stressed the importance of reversing these risks factors.  The initial  goal is to lose at least 5-10% of starting weight to help reduce risk factors.   Counseling:  Intensive lifestyle modifications discussed with Victoria Garcia as the most appropriate first line treatment.  she will continue to work on diet, exercise, and weight loss efforts.  We will continue to reassess these conditions on a fairly regular basis in an attempt to decrease the patient's overall morbidity and mortality.  5. Obesity, Current BMI 29.7 Victoria Garcia is currently in the action stage of change. As such, her goal is to continue with weight loss efforts. She has agreed to the Category 2 Plan.   Come to next appointment 30 minutes prior to scheduled time for repeat IC and fasting blood work. Also, follow up with counselor.   Exercise goals:  Increase activity to 4 days a week of walking/aerobic activity for 30 minutes.  Behavioral modification strategies: meal planning and cooking strategies, emotional eating strategies, and avoiding temptations.  Victoria Garcia has agreed to follow-up with our clinic in 4 weeks (repeat IC and fasting blood to be drawn). She was informed of the importance of frequent follow-up visits to maximize her success with intensive lifestyle modifications for her multiple health conditions.   Objective:   Blood pressure 118/77, pulse 78, temperature 98.5 F (36.9 C), height 5\' 4"  (1.626 m), weight 173 lb (78.5 kg), SpO2 100 %. Body mass index is 29.7 kg/m.  General: Cooperative, alert, well developed, in no acute distress. HEENT: Conjunctivae and lids unremarkable. Cardiovascular: Regular rhythm.  Lungs: Normal work of breathing. Neurologic: No focal deficits.   Lab Results  Component Value Date   CREATININE 0.73 02/17/2021   BUN 18 02/17/2021   NA 139 02/17/2021   K 4.4 02/17/2021   CL 102 02/17/2021   CO2 30 02/17/2021   Lab Results  Component Value Date   ALT 20 02/17/2021   AST 17 02/17/2021   ALKPHOS 92 01/13/2017   BILITOT 0.6 02/17/2021   Lab Results   Component Value Date   HGBA1C 6.0 (H) 06/17/2021   HGBA1C 6.0 (H) 01/04/2021   HGBA1C 5.9 (H) 08/17/2020   HGBA1C 6.1 (H) 03/30/2020   HGBA1C 6.2 (H) 12/16/2019   Lab Results  Component Value Date   INSULIN 14.3 10/27/2020   Lab Results  Component Value Date   TSH 0.859 10/27/2020   Lab Results  Component Value Date   CHOL 143 06/17/2021   HDL 40 06/17/2021   LDLCALC 89 06/17/2021   LDLDIRECT 85 06/17/2021   TRIG 71 06/17/2021   CHOLHDL 3.6 06/17/2021   Lab Results  Component Value Date   VD25OH 54 08/17/2020   VD25OH 58 08/05/2019   VD25OH 42 08/01/2017   Lab Results  Component Value Date   WBC 7.0 08/17/2020   HGB 13.5 08/17/2020   HCT 40.6 08/17/2020   MCV 90.6 08/17/2020   PLT 222 08/17/2020   Lab Results  Component Value Date   IRON 50 09/24/2014    Attestation Statements:   Reviewed by clinician on day of visit: allergies, medications, problem list, medical history,  surgical history, family history, social history, and previous encounter notes.  I, Kyung Rudd, BS, CMA, am acting as transcriptionist for [employer], DO.  I have reviewed the above documentation for accuracy and completeness, and I agree with the above. [name], D.O.  The 21st Century Cures Act was signed into law in 2016 which includes the topic of electronic health records.  This provides immediate access to information in MyChart.  This includes consultation notes, operative notes, office notes, lab results and pathology reports.  If you have any questions about what you read please let us know at your next visit so we can discuss your concerns and take corrective action if need be.  We are right here with you.

## 2021-10-15 ENCOUNTER — Other Ambulatory Visit (HOSPITAL_COMMUNITY): Payer: Self-pay

## 2021-10-27 ENCOUNTER — Ambulatory Visit (INDEPENDENT_AMBULATORY_CARE_PROVIDER_SITE_OTHER): Payer: No Typology Code available for payment source | Admitting: Family Medicine

## 2021-10-27 ENCOUNTER — Encounter (INDEPENDENT_AMBULATORY_CARE_PROVIDER_SITE_OTHER): Payer: Self-pay | Admitting: Family Medicine

## 2021-10-27 ENCOUNTER — Other Ambulatory Visit (HOSPITAL_COMMUNITY): Payer: Self-pay

## 2021-10-27 VITALS — BP 129/80 | HR 70 | Temp 98.4°F | Ht 64.0 in | Wt 180.0 lb

## 2021-10-27 DIAGNOSIS — R0602 Shortness of breath: Secondary | ICD-10-CM

## 2021-10-27 DIAGNOSIS — R632 Polyphagia: Secondary | ICD-10-CM | POA: Diagnosis not present

## 2021-10-27 DIAGNOSIS — R4589 Other symptoms and signs involving emotional state: Secondary | ICD-10-CM | POA: Insufficient documentation

## 2021-10-27 DIAGNOSIS — E669 Obesity, unspecified: Secondary | ICD-10-CM

## 2021-10-27 DIAGNOSIS — Z6830 Body mass index (BMI) 30.0-30.9, adult: Secondary | ICD-10-CM | POA: Diagnosis not present

## 2021-10-27 DIAGNOSIS — Z7985 Long-term (current) use of injectable non-insulin antidiabetic drugs: Secondary | ICD-10-CM

## 2021-10-27 DIAGNOSIS — Z9189 Other specified personal risk factors, not elsewhere classified: Secondary | ICD-10-CM

## 2021-10-27 MED ORDER — SEMAGLUTIDE-WEIGHT MANAGEMENT 0.25 MG/0.5ML ~~LOC~~ SOAJ: 0.2500 mg | SUBCUTANEOUS | 0 refills | Status: DC

## 2021-10-27 MED ORDER — SEMAGLUTIDE-WEIGHT MANAGEMENT 0.25 MG/0.5ML ~~LOC~~ SOAJ
0.2500 mg | SUBCUTANEOUS | 0 refills | Status: DC
  Filled 2021-10-27: qty 2, 30d supply, fill #0

## 2021-10-27 MED ORDER — BUPROPION HCL ER (SR) 150 MG PO TB12
150.0000 mg | ORAL_TABLET | Freq: Two times a day (BID) | ORAL | 0 refills | Status: DC
  Filled 2021-10-27: qty 60, 30d supply, fill #0

## 2021-10-27 MED ORDER — BUPROPION HCL ER (SR) 150 MG PO TB12: 150.0000 mg | ORAL_TABLET | Freq: Two times a day (BID) | ORAL | 0 refills | Status: DC

## 2021-10-27 NOTE — Telephone Encounter (Signed)
Please advise, last dose rx'd was 0.5mg 

## 2021-11-03 NOTE — Progress Notes (Unsigned)
Chief Complaint:   OBESITY Victoria Garcia is here to discuss her progress with her obesity treatment plan along with follow-up of her obesity related diagnoses. Victoria Garcia is on the Category 2 Plan and states she is following her eating plan approximately 40% of the time. Victoria Garcia states she is walking 30 minutes 3 times per week.  Today's visit was #: 7 Starting weight: 200 lbs Starting date: 10/27/2020 Today's weight: 180 lbs Today's date: 10/27/2021 Total lbs lost to date: 20 Total lbs lost since last in-office visit: +7  Interim History: Pt gained 7 lbs because of stress eating chips and sweets. She maintained her muscle mass and gained 7 lbs of fat mass. Pt is frustrated and upset with herself. She is mad she could not get Wegovy.   Subjective:   1. Polyphagia We wrote a prescription for Wegovy on 09/30/2021 and still can't find it in stock.  2. Depressed mood with emotional eating Victoria Garcia doesn't gel with EAP counselor so she stopped going. Pt denies need for change in Wellbutrin dose.  3. SOB (shortness of breath) on exertion Victoria Garcia notes increasing shortness of breath with exercising and seems to be worsening over time with weight gain. She notes getting out of breath sooner with activity than she used to. This has gotten worse recently. Victoria Garcia denies shortness of breath at rest or orthopnea.  Assessment/Plan:   1. Polyphagia Intensive lifestyle modifications are the first line treatment for this issue. We discussed several lifestyle modifications today and she will continue to work on diet, exercise and weight loss efforts. Orders and follow up as documented in patient record. Pt is to call various pharmacies to see who has Wegovy in [place].  Counseling Polyphagia is excessive hunger. Causes can include: low blood sugars, hypERthyroidism, PMS, lack of sleep, stress, insulin resistance, diabetes, certain medications, and diets that are deficient in protein and fiber.   Start if pt can  find it in stock- Semaglutide-Weight Management 0.25 MG/0.5ML SOAJ; Inject 0.25 mg into the skin once a week.  Dispense: 2 mL; Refill: 0  2. Depressed mood with emotional eating Behavior modification techniques were discussed today to help Victoria Garcia deal with her emotional/non-hunger eating behaviors.  Orders and follow up as documented in patient record. Call EAP for a new counselor, continue journaling, and increase exercise.  Refill- buPROPion (WELLBUTRIN SR) 150 MG 12 hr tablet; Take 1 tablet (150 mg total) by mouth 2 (two) times daily.  Dispense: 60 tablet; Refill: 0  3. SOB (shortness of breath) on exertion Victoria Garcia does feel that she gets out of breath more easily that she used to when she exercises. Victoria Garcia's shortness of breath appears to be obesity related and exercise induced. She has agreed to work on weight loss and gradually increase exercise to treat her exercise induced shortness of breath. Will continue to monitor closely. Repeat IC today.  4. Obesity, Current BMI 30.9 Victoria Garcia is currently in the action stage of change. As such, her goal is to continue with weight loss efforts. She has agreed to the Category 2 Plan.   Long discussion with pt regarding meds only augment her journey and she does not NEED meds to lose weight. She has lost 27 lbs on her own. Pt will refocus on intentional eating practices.  Exercise goals: For substantial health benefits, adults should do at least 150 minutes (2 hours and 30 minutes) a week of moderate-intensity, or 75 minutes (1 hour and 15 minutes) a week of vigorous-intensity aerobic physical activity,  or an equivalent combination of moderate- and vigorous-intensity aerobic activity. Aerobic activity should be performed in episodes of at least 10 minutes, and preferably, it should be spread throughout the week.  Behavioral modification strategies: increasing lean protein intake, no skipping meals, emotional eating strategies, and avoiding  temptations.  Victoria Garcia has agreed to follow-up with our clinic in 2 weeks. She was informed of the importance of frequent follow-up visits to maximize her success with intensive lifestyle modifications for her multiple health conditions.   Objective:   Blood pressure 129/80, pulse 70, temperature 98.4 F (36.9 C), height 5\' 4"  (1.626 m), weight 180 lb (81.6 kg), SpO2 100 %. Body mass index is 30.9 kg/m.  General: Cooperative, alert, well developed, in no acute distress. HEENT: Conjunctivae and lids unremarkable. Cardiovascular: Regular rhythm.  Lungs: Normal work of breathing. Neurologic: No focal deficits.   Lab Results  Component Value Date   CREATININE 0.73 02/17/2021   BUN 18 02/17/2021   NA 139 02/17/2021   K 4.4 02/17/2021   CL 102 02/17/2021   CO2 30 02/17/2021   Lab Results  Component Value Date   ALT 20 02/17/2021   AST 17 02/17/2021   ALKPHOS 92 01/13/2017   BILITOT 0.6 02/17/2021   Lab Results  Component Value Date   HGBA1C 6.0 (H) 06/17/2021   HGBA1C 6.0 (H) 01/04/2021   HGBA1C 5.9 (H) 08/17/2020   HGBA1C 6.1 (H) 03/30/2020   HGBA1C 6.2 (H) 12/16/2019   Lab Results  Component Value Date   INSULIN 14.3 10/27/2020   Lab Results  Component Value Date   TSH 0.859 10/27/2020   Lab Results  Component Value Date   CHOL 143 06/17/2021   HDL 40 06/17/2021   LDLCALC 89 06/17/2021   LDLDIRECT 85 06/17/2021   TRIG 71 06/17/2021   CHOLHDL 3.6 06/17/2021   Lab Results  Component Value Date   VD25OH 54 08/17/2020   VD25OH 58 08/05/2019   VD25OH 42 08/01/2017   Lab Results  Component Value Date   WBC 7.0 08/17/2020   HGB 13.5 08/17/2020   HCT 40.6 08/17/2020   MCV 90.6 08/17/2020   PLT 222 08/17/2020   Lab Results  Component Value Date   IRON 50 09/24/2014   Attestation Statements:   Reviewed by clinician on day of visit: allergies, medications, problem list, medical history, surgical history, family history, social history, and previous  encounter notes.  I, Kathlene November, BS, CMA, am acting as transcriptionist for [employer], DO.  I have reviewed the above documentation for accuracy and completeness, and I agree with the above. -  ***

## 2021-11-15 ENCOUNTER — Encounter (INDEPENDENT_AMBULATORY_CARE_PROVIDER_SITE_OTHER): Payer: Self-pay | Admitting: Family Medicine

## 2021-11-15 ENCOUNTER — Ambulatory Visit (INDEPENDENT_AMBULATORY_CARE_PROVIDER_SITE_OTHER): Payer: No Typology Code available for payment source | Admitting: Family Medicine

## 2021-11-15 VITALS — BP 118/74 | HR 85 | Temp 98.6°F | Ht 64.0 in | Wt 179.0 lb

## 2021-11-15 DIAGNOSIS — E669 Obesity, unspecified: Secondary | ICD-10-CM

## 2021-11-15 DIAGNOSIS — Z6830 Body mass index (BMI) 30.0-30.9, adult: Secondary | ICD-10-CM

## 2021-11-15 DIAGNOSIS — J3089 Other allergic rhinitis: Secondary | ICD-10-CM

## 2021-11-15 DIAGNOSIS — R632 Polyphagia: Secondary | ICD-10-CM

## 2021-11-15 DIAGNOSIS — Z9189 Other specified personal risk factors, not elsewhere classified: Secondary | ICD-10-CM

## 2021-11-15 MED ORDER — SEMAGLUTIDE-WEIGHT MANAGEMENT 0.5 MG/0.5ML ~~LOC~~ SOAJ: 0.5000 mg | SUBCUTANEOUS | 0 refills | Status: AC

## 2021-11-15 MED ORDER — LEVOCETIRIZINE DIHYDROCHLORIDE 5 MG PO TABS: 5.0000 mg | ORAL_TABLET | Freq: Every evening | ORAL | 0 refills | Status: DC

## 2021-11-19 NOTE — Progress Notes (Signed)
Chief Complaint:   OBESITY Victoria Garcia is here to discuss her progress with her obesity treatment plan along with follow-up of her obesity related diagnoses. Victoria Garcia is on the Category 2 Plan with breakfast and lunch options and states she is following her eating plan approximately 50% of the time. Victoria Garcia states she is walking 30 minutes 3 times per week.  Today's visit was #: 20 Starting weight: 200 lbs Starting date: 10/27/2020 Today's weight: 179 lbs Today's date: 11/15/2021 Total lbs lost to date: 21 Total lbs lost since last in-office visit: 1  Interim History: Pt reports things have been more challenging lately due to increased stressors. She has been doing more emotional eating as well. She has not contacted a counselor for CBT yet. Pt has been finding it more challenging to stay on plan lately.  Subjective:   1. Polyphagia Victoria Garcia started [place] at her last OV. She reports meds lasted the entire first week but felt it wore off the last couple days of the second week. Pt denies side effects.   2. Environmental and seasonal allergies Pt takes meds every night. If she misses a dose, her symptoms increase significantly. She is tolerating meds well.  3. At risk for side effect of medication Victoria Garcia is at risk for side effects of medication due to increasing dose of medication.  Assessment/Plan:  No orders of the defined types were placed in this encounter.   Medications Discontinued During This Encounter  Medication Reason   Semaglutide-Weight Management 0.25 MG/0.5ML SOAJ Dose change   levocetirizine (XYZAL) 5 MG tablet Reorder     Meds ordered this encounter  Medications   Semaglutide-Weight Management 0.5 MG/0.5ML SOAJ    Sig: Inject 0.5 mg into the skin once a week. Q thursday    Dispense:  2 mL    Refill:  0   levocetirizine (XYZAL) 5 MG tablet    Sig: Take 1 tablet (5 mg total) by mouth every evening.    Dispense:  30 tablet    Refill:  0     1.  Polyphagia Intensive lifestyle modifications are the first line treatment for this issue. We discussed several lifestyle modifications today and she will continue to work on diet, exercise and weight loss efforts. Orders and follow up as documented in patient record. Continue prudent nutritional plan and exercise. Increase Wegovy to 0.5 mg weekly (every Thursday).  Counseling Polyphagia is excessive hunger. Causes can include: low blood sugars, hypERthyroidism, PMS, lack of sleep, stress, insulin resistance, diabetes, certain medications, and diets that are deficient in protein and fiber.   Increase & Refill- Semaglutide-Weight Management 0.5 MG/0.5ML SOAJ; Inject 0.5 mg into the skin once a week. Q Thursday  Dispense: 2 mL; Refill: 0  2. Environmental and seasonal allergies Continue current treatment plan and preventative strategies, as well.   Refill- levocetirizine (XYZAL) 5 MG tablet; Take 1 tablet (5 mg total) by mouth every evening.  Dispense: 30 tablet; Refill: 0  3. At risk for side effect of medication Due to Victoria Garcia's current conditions and medications, she is at a higher risk for drug side effect.  At least 9 minutes was spent on counseling her about these concerns today.  We discussed the benefits and potential risks of these medications, and all of patient's concerns were addressed and questions were answered.  she will call us, or their PCP or other specialists who treat their conditions with medications, with any questions or concerns that may develop.    4.  Obesity, Current BMI 30.8 Victoria Garcia is currently in the action stage of change. As such, her goal is to continue with weight loss efforts. She has agreed to the Category 2 Plan with breakfast and lunch options.   We discussed challenges and barriers. Pt will focus on meal prep and planning. She also did very well with increasing muscle mas and decreasing fat mass versus her last OV. Exercise goals:  As is  Behavioral  modification strategies: meal planning and cooking strategies.  Victoria Garcia has agreed to follow-up with our clinic in 3 weeks. She was informed of the importance of frequent follow-up visits to maximize her success with intensive lifestyle modifications for her multiple health conditions.   Objective:   Blood pressure 118/74, pulse 85, temperature 98.6 F (37 C), height 5\' 4"  (1.626 m), weight 179 lb (81.2 kg), SpO2 99 %. Body mass index is 30.73 kg/m.  General: Cooperative, alert, well developed, in no acute distress. HEENT: Conjunctivae and lids unremarkable. Cardiovascular: Regular rhythm.  Lungs: Normal work of breathing. Neurologic: No focal deficits.   Lab Results  Component Value Date   CREATININE 0.73 02/17/2021   BUN 18 02/17/2021   NA 139 02/17/2021   K 4.4 02/17/2021   CL 102 02/17/2021   CO2 30 02/17/2021   Lab Results  Component Value Date   ALT 20 02/17/2021   AST 17 02/17/2021   ALKPHOS 92 01/13/2017   BILITOT 0.6 02/17/2021   Lab Results  Component Value Date   HGBA1C 6.0 (H) 06/17/2021   HGBA1C 6.0 (H) 01/04/2021   HGBA1C 5.9 (H) 08/17/2020   HGBA1C 6.1 (H) 03/30/2020   HGBA1C 6.2 (H) 12/16/2019   Lab Results  Component Value Date   INSULIN 14.3 10/27/2020   Lab Results  Component Value Date   TSH 0.859 10/27/2020   Lab Results  Component Value Date   CHOL 143 06/17/2021   HDL 40 06/17/2021   LDLCALC 89 06/17/2021   LDLDIRECT 85 06/17/2021   TRIG 71 06/17/2021   CHOLHDL 3.6 06/17/2021   Lab Results  Component Value Date   VD25OH 54 08/17/2020   VD25OH 58 08/05/2019   VD25OH 42 08/01/2017   Lab Results  Component Value Date   WBC 7.0 08/17/2020   HGB 13.5 08/17/2020   HCT 40.6 08/17/2020   MCV 90.6 08/17/2020   PLT 222 08/17/2020   Lab Results  Component Value Date   IRON 50 09/24/2014    Attestation Statements:   Reviewed by clinician on day of visit: allergies, medications, problem list, medical history, surgical history,  family history, social history, and previous encounter notes.  I, 11/24/2014, BS, CMA, am acting as transcriptionist for Kyung Rudd, DO.  I have reviewed the above documentation for accuracy and completeness, and I agree with the above. [employer], D.O.  The 21st Century Cures Act was signed into law in 2016 which includes the topic of electronic health records.  This provides immediate access to information in MyChart.  This includes consultation notes, operative notes, office notes, lab results and pathology reports.  If you have any questions about what you read please let 2017 know at your next visit so we can discuss your concerns and take corrective action if need be.  We are right here with you.

## 2021-11-22 ENCOUNTER — Other Ambulatory Visit (INDEPENDENT_AMBULATORY_CARE_PROVIDER_SITE_OTHER): Payer: Self-pay | Admitting: Family Medicine

## 2021-11-22 DIAGNOSIS — R4589 Other symptoms and signs involving emotional state: Secondary | ICD-10-CM

## 2021-11-24 ENCOUNTER — Other Ambulatory Visit: Payer: Self-pay

## 2021-12-13 ENCOUNTER — Other Ambulatory Visit (INDEPENDENT_AMBULATORY_CARE_PROVIDER_SITE_OTHER): Payer: Self-pay | Admitting: Family Medicine

## 2021-12-13 DIAGNOSIS — J3089 Other allergic rhinitis: Secondary | ICD-10-CM

## 2021-12-17 ENCOUNTER — Encounter: Payer: No Typology Code available for payment source | Admitting: Physician Assistant

## 2021-12-20 ENCOUNTER — Other Ambulatory Visit (HOSPITAL_COMMUNITY): Payer: Self-pay

## 2021-12-20 ENCOUNTER — Ambulatory Visit (INDEPENDENT_AMBULATORY_CARE_PROVIDER_SITE_OTHER): Payer: No Typology Code available for payment source | Admitting: Nurse Practitioner

## 2021-12-20 ENCOUNTER — Encounter (INDEPENDENT_AMBULATORY_CARE_PROVIDER_SITE_OTHER): Payer: Self-pay | Admitting: Nurse Practitioner

## 2021-12-20 VITALS — HR 74 | Temp 98.0°F | Ht 64.0 in | Wt 184.0 lb

## 2021-12-20 DIAGNOSIS — R4589 Other symptoms and signs involving emotional state: Secondary | ICD-10-CM | POA: Diagnosis not present

## 2021-12-20 DIAGNOSIS — E669 Obesity, unspecified: Secondary | ICD-10-CM | POA: Diagnosis not present

## 2021-12-20 DIAGNOSIS — Z6831 Body mass index (BMI) 31.0-31.9, adult: Secondary | ICD-10-CM

## 2021-12-20 DIAGNOSIS — R632 Polyphagia: Secondary | ICD-10-CM | POA: Diagnosis not present

## 2021-12-20 LAB — HM MAMMOGRAPHY

## 2021-12-20 MED ORDER — BUPROPION HCL ER (SR) 150 MG PO TB12
150.0000 mg | ORAL_TABLET | Freq: Two times a day (BID) | ORAL | 0 refills | Status: DC
  Filled 2021-12-20: qty 60, 30d supply, fill #0

## 2021-12-20 MED ORDER — SAXENDA 18 MG/3ML ~~LOC~~ SOPN
3.0000 mg | PEN_INJECTOR | Freq: Every day | SUBCUTANEOUS | 0 refills | Status: DC
  Filled 2021-12-20 – 2021-12-21 (×2): qty 15, 30d supply, fill #0

## 2021-12-21 ENCOUNTER — Encounter (INDEPENDENT_AMBULATORY_CARE_PROVIDER_SITE_OTHER): Payer: Self-pay

## 2021-12-21 ENCOUNTER — Telehealth (INDEPENDENT_AMBULATORY_CARE_PROVIDER_SITE_OTHER): Payer: Self-pay | Admitting: Nurse Practitioner

## 2021-12-21 ENCOUNTER — Other Ambulatory Visit (HOSPITAL_COMMUNITY): Payer: Self-pay

## 2021-12-21 MED ORDER — INSULIN PEN NEEDLE 31G X 5 MM MISC
0 refills | Status: DC
  Filled 2021-12-21: qty 100, 90d supply, fill #0

## 2021-12-21 NOTE — Telephone Encounter (Signed)
Irene Limbo - Prior authorization approved for Saxenda. Effective: 12/21/2021 to 04/12/2022. Patient sent approval message via mychart.

## 2021-12-22 NOTE — Progress Notes (Signed)
Chief Complaint:   OBESITY Victoria Garcia is here to discuss her progress with her obesity treatment plan along with follow-up of her obesity related diagnoses. Victoria Garcia is on the Category 2 Plan and states she is following her eating plan approximately 70% of the time. Victoria Garcia states she is walking 45 minutes 3 times per week.  Today's visit was #: 21 Starting weight: 200 lbs Starting date: 10/27/2020 Today's weight: 184 lbs Today's date: 12/20/2021 Total lbs lost to date: 16 lbs Total lbs lost since last in-office visit: 0  Interim History: Victoria Garcia is unable to take [place] due to [employer]. She has taken Saxenda, and Phentermine in the past for medical weight loss. She is following intermitted fasting 16/8. Eating 11am-7pm; Has gotten off track over the past 2 weeks. She started back walking more and has been working in the yard. She is struggling with hunger and cravings.   Subjective:   1. Polyphagia Victoria Garcia was prescribed RKYHCW but unable to take due to nationwide shortage. She reports polyphagia and cravings since stopping Wegovy. She has tried [name], [place], [place] in the past. Struggling with stress and emotional eating.  2. Depressed mood with emotional eating Erika is currently taking Wellbutrin SR 150 mg twice a day. Denies any side effects.  Assessment/Plan:   1. Polyphagia Start Saxenda 0.6 mg SubQ once daily for 1 week and then increase to 1.2mg  daily with 0 refills. Side effects discussed. Patient denies active gallstones, MENS 2, history of medullary thyroid cancer or pancreatitis.    -Start Liraglutide -Weight Management (SAXENDA) 18 MG/3ML SOPN; Inject 3 mg into the skin daily.  Dispense: 15 mL; Refill: 0  -Fill Insulin Pen Needle 31G X 5 MM MISC; Take as directed with Saxenda  Dispense: 100 each; Refill: 0  2. Depressed mood with emotional eating Victoria Garcia will consider Contrave if unable to get Saxenda. Side effects discussed. She would need to stop  Wellbutrin if she starts Contrave or can prescribe naltrexone if does not want to stop Wellbrutrin.  Will consider options and to discuss at next visit.    We will refill Wellbutrin SR 150 mg by mouth for 1 month with 0 refills.  -Refill buPROPion (WELLBUTRIN SR) 150 MG 12 hr tablet; Take 1 tablet (150 mg total) by mouth 2 (two) times daily.  Dispense: 60 tablet; Refill: 0  3. Obesity, Current BMI 31.6 Victoria Garcia is currently in the action stage of change. As such, her goal is to continue with weight loss efforts. She has agreed to the Category 2 Plan.   Exercise goals: As is.  Would like to repeat fasting labs in 1-2 months.  Behavioral modification strategies: increasing lean protein intake, increasing water intake, and planning for success.  Victoria Garcia has agreed to follow-up with our clinic in 4 weeks. She was informed of the importance of frequent follow-up visits to maximize her success with intensive lifestyle modifications for her multiple health conditions.   Objective:   Pulse 74, temperature 98 F (36.7 C), height 5\' 4"  (1.626 m), weight 184 lb (83.5 kg), SpO2 100 %. Body mass index is 31.58 kg/m.  General: Cooperative, alert, well developed, in no acute distress. HEENT: Conjunctivae and lids unremarkable. Cardiovascular: Regular rhythm.  Lungs: Normal work of breathing. Neurologic: No focal deficits.   Lab Results  Component Value Date   CREATININE 0.73 02/17/2021   BUN 18 02/17/2021   NA 139 02/17/2021   K 4.4 02/17/2021   CL 102 02/17/2021   CO2 30  02/17/2021   Lab Results  Component Value Date   ALT 20 02/17/2021   AST 17 02/17/2021   ALKPHOS 92 01/13/2017   BILITOT 0.6 02/17/2021   Lab Results  Component Value Date   HGBA1C 6.0 (H) 06/17/2021   HGBA1C 6.0 (H) 01/04/2021   HGBA1C 5.9 (H) 08/17/2020   HGBA1C 6.1 (H) 03/30/2020   HGBA1C 6.2 (H) 12/16/2019   Lab Results  Component Value Date   INSULIN 14.3 10/27/2020   Lab Results  Component Value Date    TSH 0.859 10/27/2020   Lab Results  Component Value Date   CHOL 143 06/17/2021   HDL 40 06/17/2021   LDLCALC 89 06/17/2021   LDLDIRECT 85 06/17/2021   TRIG 71 06/17/2021   CHOLHDL 3.6 06/17/2021   Lab Results  Component Value Date   VD25OH 54 08/17/2020   VD25OH 58 08/05/2019   VD25OH 42 08/01/2017   Lab Results  Component Value Date   WBC 7.0 08/17/2020   HGB 13.5 08/17/2020   HCT 40.6 08/17/2020   MCV 90.6 08/17/2020   PLT 222 08/17/2020   Lab Results  Component Value Date   IRON 50 09/24/2014   Attestation Statements:   Reviewed by clinician on day of visit: allergies, medications, problem list, medical history, surgical history, family history, social history, and previous encounter notes.  I, Brendell Tyus, RMA, am acting as transcriptionist for Irene Limbo, FNP.  I have reviewed the above documentation for accuracy and completeness, and I agree with the above. Irene Limbo, FNP

## 2021-12-24 ENCOUNTER — Encounter: Payer: Self-pay | Admitting: Internal Medicine

## 2021-12-28 ENCOUNTER — Encounter: Payer: Self-pay | Admitting: Medical

## 2021-12-29 ENCOUNTER — Encounter (INDEPENDENT_AMBULATORY_CARE_PROVIDER_SITE_OTHER): Payer: Self-pay

## 2022-01-12 ENCOUNTER — Other Ambulatory Visit (HOSPITAL_COMMUNITY)
Admission: RE | Admit: 2022-01-12 | Discharge: 2022-01-12 | Disposition: A | Discharge status: Home / Self Care | Payer: No Typology Code available for payment source | Source: Ambulatory Visit | Attending: Medical | Admitting: Medical

## 2022-01-12 ENCOUNTER — Encounter: Payer: Self-pay | Admitting: Medical

## 2022-01-12 ENCOUNTER — Ambulatory Visit (INDEPENDENT_AMBULATORY_CARE_PROVIDER_SITE_OTHER): Payer: No Typology Code available for payment source | Admitting: Medical

## 2022-01-12 VITALS — BP 110/70 | HR 91 | Ht 65.0 in | Wt 186.2 lb

## 2022-01-12 DIAGNOSIS — Z0000 Encounter for general adult medical examination without abnormal findings: Secondary | ICD-10-CM | POA: Diagnosis not present

## 2022-01-12 DIAGNOSIS — Z124 Encounter for screening for malignant neoplasm of cervix: Secondary | ICD-10-CM

## 2022-01-12 DIAGNOSIS — E785 Hyperlipidemia, unspecified: Secondary | ICD-10-CM | POA: Diagnosis not present

## 2022-01-12 DIAGNOSIS — Z1211 Encounter for screening for malignant neoplasm of colon: Secondary | ICD-10-CM

## 2022-01-12 DIAGNOSIS — Z131 Encounter for screening for diabetes mellitus: Secondary | ICD-10-CM | POA: Diagnosis not present

## 2022-01-12 DIAGNOSIS — Z7185 Encounter for immunization safety counseling: Secondary | ICD-10-CM

## 2022-01-12 DIAGNOSIS — R232 Flushing: Secondary | ICD-10-CM

## 2022-01-12 DIAGNOSIS — Z780 Asymptomatic menopausal state: Secondary | ICD-10-CM

## 2022-01-12 LAB — POCT URINALYSIS DIP (PROADVANTAGE DEVICE)
Glucose, UA: NEGATIVE mg/dL
Ketones, POC UA: NEGATIVE mg/dL
Nitrite, UA: NEGATIVE
Protein Ur, POC: NEGATIVE mg/dL
Specific Gravity, Urine: 1.025
Urobilinogen, Ur: NEGATIVE
pH, UA: 6.5 (ref 5.0–8.0)

## 2022-01-12 NOTE — Progress Notes (Signed)
Subjective:   HPI  Victoria Garcia is a 61 y.o. female who presents for Chief Complaint  Patient presents with   new pt, fasting cpe     New pt fasting cpe, been off cholesterol meds- x 30 days due to running out, obgyn - pap and breast due    Patient Care Team: Taesha Goodell, Cleda Mccreedy as PCP - General (Family Medicine) Sees dentist Sees eye doctor Prior PCP [facility]  Concerns: Hot flashes morning and night. Using some natural supplements, oils.  Was on paxil which didn't help a lot but this was d/c due to weight gain  Gets some left hip pains intermittent.  Was flaring up recently with wearing lead apron at work for prolonged periods standing.  Gynecological history:  several pregnancies, 2 live births, 3 miscarriages. LMP 6 years ago after endometrial ablation.  Past Medical History:  Diagnosis Date   Allergy    SEASONAL   Anemia, iron deficiency 09/24/2014   Back pain    Depression    History of stomach ulcers    Hyperlipidemia    Joint pain    Multiple food allergies    Prediabetes    Vitamin D deficiency     Family History  Problem Relation Age of Onset   Cancer Mother 2       Squamous cell Primary unknown   Hypertension Mother    Hyperlipidemia Mother    Thyroid disease Mother    Cancer Father        Prostate cancer   Diabetes Father    Thyroid disease Maternal Aunt    Cancer Maternal Aunt        All mother siblings died from cancer   Thyroid disease Maternal Uncle    Cancer Maternal Uncle    Thyroid disease Maternal Grandmother    Cancer Maternal Grandmother        STOMACH CANCER   Cancer Maternal Grandfather        Prostate cancer     Current Outpatient Medications:    b complex vitamins capsule, Take 1 capsule by mouth daily., Disp: , Rfl:    buPROPion (WELLBUTRIN SR) 150 MG 12 hr tablet, Take 1 tablet (150 mg total) by mouth 2 (two) times daily., Disp: 60 tablet, Rfl: 0   Cholecalciferol (VITAMIN D) 2000  units tablet, Take 1 tablet (2,000 Units total) by mouth daily., Disp: , Rfl:    levocetirizine (XYZAL) 5 MG tablet, Take 1 tablet (5 mg total) by mouth every evening., Disp: 30 tablet, Rfl: 0   Liraglutide -Weight Management (SAXENDA) 18 MG/3ML SOPN, Inject 3 mg into the skin daily., Disp: 15 mL, Rfl: 0   Multiple Vitamin (MULTIVITAMIN WITH MINERALS) TABS tablet, Take 1 tablet by mouth daily., Disp: , Rfl:    atorvastatin (LIPITOR) 40 MG tablet, Take 1 tablet (40 mg total) by mouth at bedtime. (Patient not taking: Reported on 01/12/2022), Disp: 90 tablet, Rfl: 0   Insulin Pen Needle 31G X 5 MM MISC, Take as directed with Saxenda, Disp: 100 each, Rfl: 0  Allergies  Allergen Reactions   Contrast Media [Iodinated Contrast Media] Anaphylaxis   Shellfish Allergy Anaphylaxis   Penicillins Rash and Other (See Comments)    Has patient had a PCN reaction causing immediate rash, facial/tongue/throat swelling, SOB or lightheadedness with hypotension: No Has patient had a PCN reaction causing severe rash involving mucus membranes or skin necrosis: No Has patient had a PCN reaction that required hospitalization  No Has patient had a PCN reaction occurring within the last 10 years: No If all of the above answers are "NO", then may proceed with Cephalosporin use.   Sulfa Antibiotics Rash    Reviewed their medical, surgical, family, social, medication, and allergy history and updated chart as appropriate.   Review of Systems Constitutional: -fever, -chills, -sweats, -unexpected weight change, -decreased appetite, -fatigue Allergy: -sneezing, -itching, -congestion Dermatology: -changing moles, --rash, -lumps ENT: -runny nose, -ear pain, -sore throat, -hoarseness, -sinus pain, -teeth pain, - ringing in ears, -hearing loss, -nosebleeds Cardiology: -chest pain, -palpitations, -swelling, -difficulty breathing when lying flat, -waking up short of breath Respiratory: -cough, -shortness of breath, -difficulty  breathing with exercise or exertion, -wheezing, -coughing up blood Gastroenterology: -abdominal pain, -nausea, -vomiting, -diarrhea, -constipation, -blood in stool, -changes in bowel movement, -difficulty swallowing or eating Hematology: -bleeding, -bruising  Musculoskeletal: +joint aches, -muscle aches, -joint swelling, -back pain, -neck pain, -cramping, -changes in gait Ophthalmology: denies vision changes, eye redness, itching, discharge Urology: -burning with urination, -difficulty urinating, -blood in urine, -urinary frequency, -urgency, -incontinence Neurology: -headache, -weakness, -tingling, -numbness, -memory loss, -falls, -dizziness Psychology: -depressed mood, -agitation, -sleep problems Breast/gyn: -breast tendnerss, -discharge, -lumps, -vaginal discharge,- irregular periods, -heavy periods      01/12/2022    3:09 PM 10/27/2020    7:48 AM 08/17/2020    9:45 AM 07/13/2020   12:19 PM 12/16/2019   11:35 AM  Depression screen PHQ 2/9  Decreased Interest 0 1 0 0 0  Down, Depressed, Hopeless 0 1 0 0 0  PHQ - 2 Score 0 2 0 0 0  Altered sleeping  0 0    Tired, decreased energy  1 0    Change in appetite  1 0    Feeling bad or failure about yourself   2 0    Trouble concentrating  2 0    Moving slowly or fidgety/restless  0 0    Suicidal thoughts  0 0    PHQ-9 Score  8 0    Difficult doing work/chores  Not difficult at all Not difficult at all         Objective:  BP 110/70   Pulse 91   Ht 5\' 5"  (1.651 m)   Wt 186 lb 3.2 oz (84.5 kg)   BMI 30.99 kg/m   General appearance: alert, no distress, WD/WN, African American female Skin: unremarkable HEENT: normocephalic, conjunctiva/corneas normal, sclerae anicteric, PERRLA, EOMi, nares patent, no discharge or erythema, pharynx normal Oral cavity: MMM, tongue normal, teeth normal Neck: supple, no lymphadenopathy, no thyromegaly, no masses, normal ROM, no bruits Chest: non tender, normal shape and expansion Heart: RRR, normal S1,  S2, no murmurs Lungs: CTA bilaterally, no wheezes, rhonchi, or rales Abdomen: +bs, soft, non tender, non distended, no masses, no hepatomegaly, no splenomegaly, no bruits Back: non tender, normal ROM, no scoliosis Musculoskeletal: mild left trochanteric tenderness of hip, otherwise upper extremities non tender, no obvious deformity, normal ROM throughout, lower extremities non tender, no obvious deformity, normal ROM throughout Extremities: no edema, no cyanosis, no clubbing Pulses: 2+ symmetric, upper and lower extremities, normal cap refill Neurological: alert, oriented x 3, CN2-12 intact, strength normal upper extremities and lower extremities, sensation normal throughout, DTRs 2+ throughout, no cerebellar signs, gait normal Psychiatric: normal affect, behavior normal, pleasant  Breast: surgical scars from prior breast reduction surgery bilat, nontender, no masses or lumps, no skin changes, no nipple discharge or inversion, no axillary lymphadenopathy Gyn: Normal external genitalia without lesions, vagina with  normal mucosa, cervix without lesions, no cervical motion tenderness, no abnormal vaginal discharge.  Uterus enlarged, hx/o fibroids,  adnexa not enlarged, nontender, no masses.  Pap performed.  Exam chaperoned by nurse. Rectal: deferred   Assessment and Plan :   Encounter Diagnoses  Name Primary?   Encounter for health maintenance examination in adult Yes   Hyperlipidemia, unspecified hyperlipidemia type    Screening for diabetes mellitus    Vaccine counseling    Hot flashes    Menopause    Screening for colon cancer    Screening for cervical cancer      This visit was a preventative care visit, also known as wellness visit or routine physical.   Topics typically include healthy lifestyle, diet, exercise, preventative care, vaccinations, sick and well care, proper use of emergency dept and after hours care, as well as other concerns.     Recommendations: Continue to return  yearly for your annual wellness and preventative care visits.  This gives us a chance to discuss healthy lifestyle, exercise, vaccinations, review your chart record, and perform screenings where appropriate.  I recommend you see your eye doctor yearly for routine vision care.  I recommend you see your dentist yearly for routine dental care including hygiene visits twice yearly.   Vaccination recommendations were reviewed Immunization History  Administered Date(s) Administered   Influenza,inj,Quad PF,6+ Mos 02/21/2020   Influenza,inj,quad, With Preservative 02/21/2015, 02/20/2017   Influenza-Unspecified 01/21/2013, 03/12/2019   PFIZER(Purple Top)SARS-COV-2 Vaccination 05/12/2019, 06/01/2019, 02/25/2020   Zoster Recombinat (Shingrix) 08/11/2017, 10/12/2017    We will request copy of vaccines from health at work   Screening for cancer: Colon cancer screening: Due possible 2024.  Fecal tube test sent home  Breast cancer screening: You should perform a self breast exam monthly.   We reviewed recommendations for regular mammograms and breast cancer screening.  Cervical cancer screening: We reviewed recommendations for pap smear screening.   Skin cancer screening: Check your skin regularly for new changes, growing lesions, or other lesions of concern Come in for evaluation if you have skin lesions of concern.  Lung cancer screening: If you have a greater than 20 pack year history of tobacco use, then you may qualify for lung cancer screening with a chest CT scan.   Please call your insurance company to inquire about coverage for this test.  We currently don't have screenings for other cancers besides breast, cervical, colon, and lung cancers.  If you have a strong family history of cancer or have other cancer screening concerns, please let me know.    Bone health: Get at least 150 minutes of aerobic exercise weekly Get weight bearing exercise at least once weekly Bone density  test:  A bone density test is an imaging test that uses a type of X-ray to measure the amount of calcium and other minerals in your bones. The test may be used to diagnose or screen you for a condition that causes weak or thin bones (osteoporosis), predict your risk for a broken bone (fracture), or determine how well your osteoporosis treatment is working. The bone density test is recommended for females 65 and older, or females or males <65 if certain risk factors such as thyroid disease, long term use of steroids such as for asthma or rheumatological issues, vitamin D deficiency, estrogen deficiency, family history of osteoporosis, self or family history of fragility fracture in first degree relative.    Heart health: Get at least 150 minutes of aerobic exercise weekly Limit alcohol  It is important to maintain a healthy blood pressure and healthy cholesterol numbers  Heart disease screening: Screening for heart disease includes screening for blood pressure, fasting lipids, glucose/diabetes screening, BMI height to weight ratio, reviewed of smoking status, physical activity, and diet.    Goals include blood pressure 120/80 or less, maintaining a healthy lipid/cholesterol profile, preventing diabetes or keeping diabetes numbers under good control, not smoking or using tobacco products, exercising most days per week or at least 150 minutes per week of exercise, and eating healthy variety of fruits and vegetables, healthy oils, and avoiding unhealthy food choices like fried food, fast food, high sugar and high cholesterol foods.    Other tests may possibly include EKG test, CT coronary calcium score, echocardiogram, exercise treadmill stress test.   Consider CT coronary test, offered.  She declines for now   Medical care options: I recommend you continue to seek care here first for routine care.  We try really hard to have available appointments Monday through Friday daytime hours for sick  visits, acute visits, and physicals.  Urgent care should be used for after hours and weekends for significant issues that cannot wait till the next day.  The emergency department should be used for significant potentially life-threatening emergencies.  The emergency department is expensive, can often have long wait times for less significant concerns, so try to utilize primary care, urgent care, or telemedicine when possible to avoid unnecessary trips to the emergency department.  Virtual visits and telemedicine have been introduced since the pandemic started in 2020, and can be convenient ways to receive medical care.  We offer virtual appointments as well to assist you in a variety of options to seek medical care.   Advanced Directives: I recommend you consider completing a Health Care Power of Attorney and Living Will.   These documents respect your wishes and help alleviate burdens on your loved ones if you were to become terminally ill or be in a position to need those documents enforced.    You can complete Advanced Directives yourself, have them notarized, then have copies made for our office, for you and for anybody you feel should have them in safe keeping.  Or, you can have an attorney prepare these documents.   If you haven't updated your Last Will and Testament in a while, it may be worthwhile having an attorney prepare these documents together and save on some costs.       Separate significant issues discussed: Menopausal hot flashes - discussed possible treatments, good hydration, avoid spicy foods, use fan at bedtime, consider new medication that was just brought to market Veozah, vs clonidine vs  other.  She was on paxil, not much benefit and caused weight gain  Left hip pain - seems like some mild trochanter bursitis.  Discussed options for therapy  Hyperlipidemia - f/u pending labs   [place] was seen today for new pt, fasting cpe .  Diagnoses and all orders for this  visit:  Encounter for health maintenance examination in adult -     Hemoglobin A1c -     VITAMIN D 25 Hydroxy (Vit-D Deficiency, Fractures) -     Comprehensive metabolic panel -     CBC -     POCT Urinalysis DIP (Proadvantage Device) -     Lipid panel  Hyperlipidemia, unspecified hyperlipidemia type -     Lipid panel  Screening for diabetes mellitus -     Hemoglobin A1c  Vaccine counseling  Hot flashes  Menopause  Screening for colon cancer -     Fecal occult blood, imunochemical  Screening for cervical cancer -     Cancel: Cytology - PAP([facility]) -     Cytology - PAP()     Follow-up pending labs, yearly for physical

## 2022-01-13 ENCOUNTER — Ambulatory Visit (INDEPENDENT_AMBULATORY_CARE_PROVIDER_SITE_OTHER): Payer: No Typology Code available for payment source | Admitting: Family Medicine

## 2022-01-13 ENCOUNTER — Other Ambulatory Visit (HOSPITAL_COMMUNITY): Payer: Self-pay

## 2022-01-13 ENCOUNTER — Encounter (INDEPENDENT_AMBULATORY_CARE_PROVIDER_SITE_OTHER): Payer: Self-pay | Admitting: Family Medicine

## 2022-01-13 VITALS — BP 127/75 | HR 82 | Temp 98.1°F | Ht 65.0 in | Wt 184.0 lb

## 2022-01-13 DIAGNOSIS — R4589 Other symptoms and signs involving emotional state: Secondary | ICD-10-CM

## 2022-01-13 DIAGNOSIS — E669 Obesity, unspecified: Secondary | ICD-10-CM | POA: Diagnosis not present

## 2022-01-13 DIAGNOSIS — R632 Polyphagia: Secondary | ICD-10-CM

## 2022-01-13 DIAGNOSIS — Z9189 Other specified personal risk factors, not elsewhere classified: Secondary | ICD-10-CM

## 2022-01-13 DIAGNOSIS — Z6830 Body mass index (BMI) 30.0-30.9, adult: Secondary | ICD-10-CM

## 2022-01-13 LAB — CBC
Hematocrit: 42.6 % (ref 34.0–46.6)
Hemoglobin: 14.2 g/dL (ref 11.1–15.9)
MCH: 30.3 pg (ref 26.6–33.0)
MCHC: 33.3 g/dL (ref 31.5–35.7)
MCV: 91 fL (ref 79–97)
Platelets: 223 10*3/uL (ref 150–450)
RBC: 4.68 x10E6/uL (ref 3.77–5.28)
RDW: 12 % (ref 11.7–15.4)
WBC: 6.7 10*3/uL (ref 3.4–10.8)

## 2022-01-13 LAB — LIPID PANEL
Chol/HDL Ratio: 4.2 ratio (ref 0.0–4.4)
Cholesterol, Total: 228 mg/dL — ABNORMAL HIGH (ref 100–199)
HDL: 54 mg/dL (ref >39)
LDL Chol Calc (NIH): 157 mg/dL — ABNORMAL HIGH (ref 0–99)
Triglycerides: 97 mg/dL (ref 0–149)
VLDL Cholesterol Cal: 17 mg/dL (ref 5–40)

## 2022-01-13 LAB — COMPREHENSIVE METABOLIC PANEL
ALT: 29 IU/L (ref 0–32)
AST: 23 IU/L (ref 0–40)
Albumin/Globulin Ratio: 1.4 (ref 1.2–2.2)
Albumin: 4.6 g/dL (ref 3.8–4.9)
Alkaline Phosphatase: 90 IU/L (ref 44–121)
BUN/Creatinine Ratio: 20 (ref 12–28)
BUN: 16 mg/dL (ref 8–27)
Bilirubin Total: 0.6 mg/dL (ref 0.0–1.2)
CO2: 27 mmol/L (ref 20–29)
Calcium: 10 mg/dL (ref 8.7–10.3)
Chloride: 99 mmol/L (ref 96–106)
Creatinine, Ser: 0.81 mg/dL (ref 0.57–1.00)
Globulin, Total: 3.2 g/dL (ref 1.5–4.5)
Glucose: 88 mg/dL (ref 70–99)
Potassium: 4.4 mmol/L (ref 3.5–5.2)
Sodium: 139 mmol/L (ref 134–144)
Total Protein: 7.8 g/dL (ref 6.0–8.5)
eGFR: 83 mL/min/{1.73_m2} (ref >59)

## 2022-01-13 LAB — HEMOGLOBIN A1C
Est. average glucose Bld gHb Est-mCnc: 128 mg/dL
Hgb A1c MFr Bld: 6.1 % — ABNORMAL HIGH (ref 4.8–5.6)

## 2022-01-13 LAB — VITAMIN D 25 HYDROXY (VIT D DEFICIENCY, FRACTURES): Vit D, 25-Hydroxy: 48.7 ng/mL (ref 30.0–100.0)

## 2022-01-13 MED ORDER — BUPROPION HCL ER (SR) 150 MG PO TB12
150.0000 mg | ORAL_TABLET | Freq: Two times a day (BID) | ORAL | 0 refills | Status: DC
  Filled 2022-01-13: qty 60, 30d supply, fill #0

## 2022-01-13 MED ORDER — SAXENDA 18 MG/3ML ~~LOC~~ SOPN
3.0000 mg | PEN_INJECTOR | Freq: Every day | SUBCUTANEOUS | 0 refills | Status: DC
  Filled 2022-01-13: qty 15, 30d supply, fill #0

## 2022-01-14 ENCOUNTER — Other Ambulatory Visit (HOSPITAL_COMMUNITY): Payer: Self-pay

## 2022-01-14 ENCOUNTER — Other Ambulatory Visit: Payer: Self-pay | Admitting: Medical

## 2022-01-14 DIAGNOSIS — E7849 Other hyperlipidemia: Secondary | ICD-10-CM

## 2022-01-14 MED ORDER — ATORVASTATIN CALCIUM 40 MG PO TABS
40.0000 mg | ORAL_TABLET | Freq: Every day | ORAL | 3 refills | Status: DC
  Filled 2022-01-14: qty 90, 90d supply, fill #0
  Filled 2022-04-19: qty 90, 90d supply, fill #1
  Filled 2022-07-14 – 2022-07-20 (×3): qty 90, 90d supply, fill #2
  Filled 2022-10-26: qty 90, 90d supply, fill #3

## 2022-01-15 LAB — FECAL OCCULT BLOOD, IMMUNOCHEMICAL: Fecal Occult Bld: NEGATIVE

## 2022-01-18 LAB — CYTOLOGY - PAP
Comment: NEGATIVE
Diagnosis: NEGATIVE
High risk HPV: NEGATIVE

## 2022-01-20 NOTE — Progress Notes (Signed)
+    Chief Complaint:   OBESITY Victoria Garcia is here to discuss her progress with her obesity treatment plan along with follow-up of her obesity related diagnoses. [name] is on the Category 2 Plan and states she is following her eating plan approximately 50% of the time. [name] states she is not currently exercising.  Today's visit was #: 22 Starting weight: 200 lbs Starting date: 10/27/2020 Today's weight: 184 lbs Today's date: 01/13/2022 Total lbs lost to date: 16 Total lbs lost since last in-office visit: 0  Interim History: [name] recently started [name] by Judeth Cornfield, FNP, at her last OV. She is tolerating it well without side effects. Pt is not eating much and skipping meals.  Subjective:   1. Depressed mood with emotional eating Controlled. Medication: Wellbutrin.  2. Polyphagia Controlled. Current treatment: Saxenda.   3. At risk for malnutrition Samyiah is at risk for malnutrition due to poor intake.  Assessment/Plan:  No orders of the defined types were placed in this encounter.   Medications Discontinued During This Encounter  Medication Reason   Liraglutide -Weight Management (SAXENDA) 18 MG/3ML SOPN Reorder   buPROPion (WELLBUTRIN SR) 150 MG 12 hr tablet Reorder     Meds ordered this encounter  Medications   buPROPion (WELLBUTRIN SR) 150 MG 12 hr tablet    Sig: Take 1 tablet (150 mg total) by mouth 2 (two) times daily.    Dispense:  60 tablet    Refill:  0    Ov for rf   Liraglutide -Weight Management (SAXENDA) 18 MG/3ML SOPN    Sig: Inject 3 mg into the skin daily.    Dispense:  15 mL    Refill:  0     1. Depressed mood with emotional eating Behavior modification techniques were discussed today to help [name] deal with her emotional/non-hunger eating behaviors.  Orders and follow up as documented in patient record.   Refill- buPROPion (WELLBUTRIN SR) 150 MG 12 hr tablet; Take 1 tablet (150 mg total) by mouth 2 (two) times daily.  Dispense: 60 tablet;  Refill: 0  2. Polyphagia Intensive lifestyle modifications are the first line treatment for this issue. We discussed several lifestyle modifications today and she will continue to work on diet, exercise and weight loss efforts. Orders and follow up as documented in patient record.  Counseling Polyphagia is excessive hunger. Causes can include: low blood sugars, hypERthyroidism, PMS, lack of sleep, stress, insulin resistance, diabetes, certain medications, and diets that are deficient in protein and fiber.   Refill- Liraglutide -Weight Management (SAXENDA) 18 MG/3ML SOPN; Inject 3 mg into the skin daily.  Dispense: 15 mL; Refill: 0  3. At risk for malnutrition [name] was given extensive malnutrition prevention education and counseling today of more than 8 minutes.  Counseled her that malnutrition refers to inappropriate nutrients or not the right balance of nutrients for optimal health.  Discussed with [name] that it is absolutely possible to be malnourished but yet obese.  Risk factors, including but not limited to, inappropriate dietary choices, difficulty with obtaining food due to physical or financial limitations, and various physical and mental health conditions were reviewed with [name].   4. Obesity, Current BMI 30.8 [name] is currently in the action stage of change. As such, her goal is to continue with weight loss efforts. She has agreed to the Category 2 Plan and add breakfast and lunch options.   Reestablish exercise routine, with 20-30 minutes of walking 3 days a week. Consider getting  a Veterinary surgeon.  Exercise goals:  As is  Behavioral modification strategies: increasing lean protein intake and decreasing simple carbohydrates.  [name] has agreed to follow-up with our clinic in 3-4 weeks. She was informed of the importance of frequent follow-up visits to maximize her success with intensive lifestyle modifications for her multiple health conditions.   Objective:    Blood pressure 127/75, pulse 82, temperature 98.1 F (36.7 C), height 5\' 5"  (1.651 m), weight 184 lb (83.5 kg), SpO2 100 %. Body mass index is 30.62 kg/m.  General: Cooperative, alert, well developed, in no acute distress. HEENT: Conjunctivae and lids unremarkable. Cardiovascular: Regular rhythm.  Lungs: Normal work of breathing. Neurologic: No focal deficits.   Lab Results  Component Value Date   CREATININE 0.81 01/12/2022   BUN 16 01/12/2022   NA 139 01/12/2022   K 4.4 01/12/2022   CL 99 01/12/2022   CO2 27 01/12/2022   Lab Results  Component Value Date   ALT 29 01/12/2022   AST 23 01/12/2022   ALKPHOS 90 01/12/2022   BILITOT 0.6 01/12/2022   Lab Results  Component Value Date   HGBA1C 6.1 (H) 01/12/2022   HGBA1C 6.0 (H) 06/17/2021   HGBA1C 6.0 (H) 01/04/2021   HGBA1C 5.9 (H) 08/17/2020   HGBA1C 6.1 (H) 03/30/2020   Lab Results  Component Value Date   INSULIN 14.3 10/27/2020   Lab Results  Component Value Date   TSH 0.859 10/27/2020   Lab Results  Component Value Date   CHOL 228 (H) 01/12/2022   HDL 54 01/12/2022   LDLCALC 157 (H) 01/12/2022   LDLDIRECT 85 06/17/2021   TRIG 97 01/12/2022   CHOLHDL 4.2 01/12/2022   Lab Results  Component Value Date   VD25OH 48.7 01/12/2022   VD25OH 54 08/17/2020   VD25OH 58 08/05/2019   Lab Results  Component Value Date   WBC 6.7 01/12/2022   HGB 14.2 01/12/2022   HCT 42.6 01/12/2022   MCV 91 01/12/2022   PLT 223 01/12/2022   Lab Results  Component Value Date   IRON 50 09/24/2014    Attestation Statements:   Reviewed by clinician on day of visit: allergies, medications, problem list, medical history, surgical history, family history, social history, and previous encounter notes.  I, 11/24/2014, BS, CMA, am acting as transcriptionist for Kyung Rudd, DO.   I have reviewed the above documentation for accuracy and completeness, and I agree with the above. [employer], D.O.  The 21st  Century Cures Act was signed into law in 2016 which includes the topic of electronic health records.  This provides immediate access to information in MyChart.  This includes consultation notes, operative notes, office notes, lab results and pathology reports.  If you have any questions about what you read please let 2017 know at your next visit so we can discuss your concerns and take corrective action if need be.  We are right here with you.

## 2022-01-26 ENCOUNTER — Encounter: Payer: Self-pay | Admitting: Internal Medicine

## 2022-02-09 ENCOUNTER — Other Ambulatory Visit (HOSPITAL_COMMUNITY): Payer: Self-pay

## 2022-02-09 ENCOUNTER — Encounter (INDEPENDENT_AMBULATORY_CARE_PROVIDER_SITE_OTHER): Payer: Self-pay | Admitting: Family Medicine

## 2022-02-09 ENCOUNTER — Ambulatory Visit (INDEPENDENT_AMBULATORY_CARE_PROVIDER_SITE_OTHER): Payer: No Typology Code available for payment source | Admitting: Family Medicine

## 2022-02-09 VITALS — BP 119/76 | HR 77 | Temp 98.2°F | Ht 65.0 in | Wt 184.0 lb

## 2022-02-09 DIAGNOSIS — E669 Obesity, unspecified: Secondary | ICD-10-CM

## 2022-02-09 DIAGNOSIS — R632 Polyphagia: Secondary | ICD-10-CM

## 2022-02-09 DIAGNOSIS — Z9189 Other specified personal risk factors, not elsewhere classified: Secondary | ICD-10-CM | POA: Diagnosis not present

## 2022-02-09 DIAGNOSIS — F39 Unspecified mood [affective] disorder: Secondary | ICD-10-CM

## 2022-02-09 DIAGNOSIS — Z6830 Body mass index (BMI) 30.0-30.9, adult: Secondary | ICD-10-CM

## 2022-02-09 MED ORDER — BUPROPION HCL ER (SR) 150 MG PO TB12
150.0000 mg | ORAL_TABLET | Freq: Two times a day (BID) | ORAL | 0 refills | Status: DC
  Filled 2022-02-09: qty 60, 30d supply, fill #0

## 2022-02-09 MED ORDER — SAXENDA 18 MG/3ML ~~LOC~~ SOPN
3.0000 mg | PEN_INJECTOR | Freq: Every day | SUBCUTANEOUS | 0 refills | Status: DC
  Filled 2022-02-09 – 2022-02-11 (×3): qty 15, 30d supply, fill #0

## 2022-02-11 ENCOUNTER — Other Ambulatory Visit (HOSPITAL_COMMUNITY): Payer: Self-pay

## 2022-02-12 NOTE — Progress Notes (Unsigned)
Chief Complaint:   OBESITY Victoria Garcia is here to discuss her progress with her obesity treatment plan along with follow-up of her obesity related diagnoses. Victoria Garcia is on the Category 2 Plan with breakfast options and states she is following her eating plan approximately 50% of the time. Victoria Garcia states she is walking for 45 minutes 3 times per week.  Today's visit was #: 23  Starting weight: 200 lbs Starting date: 10/27/2020 Today's weight: 184 lbs Today's date: 02/09/2022 Total lbs lost to date: 16 lbs Total lbs lost since last in-office visit: 0 lbs  Interim History: Victoria Garcia was on vacation in [place] for one week. She also had an anniversary and birthday celebration - she di not gain any weight; which was her goal from the last office visit. She is struggling, just a little,with staying within her snack calories.  Subjective:   1. Polyphagia Controlled, current treatment is [name].    2. Mood disorder (A[facility]), emotional eating Very well controlled due to her recent vacation - Victoria Garcia feels refreshed.  3. 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   Assessment/Plan:  No orders of the defined types were placed in this encounter.   Medications Discontinued During This Encounter  Medication Reason   buPROPion (WELLBUTRIN SR) 150 MG 12 hr tablet Reorder   Liraglutide -Weight Management (SAXENDA) 18 MG/3ML SOPN Reorder     Meds ordered this encounter  Medications   Liraglutide -Weight Management (SAXENDA) 18 MG/3ML SOPN    Sig: Inject 3 mg into the skin daily.    Dispense:  15 mL    Refill:  0   buPROPion (WELLBUTRIN SR) 150 MG 12 hr tablet    Sig: Take 1 tablet (150 mg total) by mouth 2 (two) times daily.    Dispense:  60 tablet    Refill:  0    Victoria Garcia     1. Polyphagia Will refill Saxenda as follows: - Liraglutide -Weight Management (SAXENDA) 18 MG/3ML SOPN; Inject 3 mg into  the skin daily.  Dispense: 15 mL; Refill: 0  2. Mood disorder ([place]), emotional eating Discussed how thoughts affect eating habits, modeling of thoughts, feelings, and behaviors, and strategies for change.  Importance of not skipping meals and getting all her proteins and fiber in on a daily basis discussed.   - Discussed cognitive distortions, coping thoughts, and how to change our thoughts/ self talk regarding foods/ eating patterns. - No SI/ HI.  Mood stable currently - Behavior modification techniques were discussed today to help deal with emotional/ non-hunger eating behaviors including but not limited to exercise for stress management, meditation/prayer, behavorial sessions with her therapist and self care activities like adequate sleep (7-9 hrs/nite). - Importance of following up with PCP and others was stressed  - Educational handouts given to pt at their request - Reminded patient of the importance of following their prudent nutrition plan and how food can affect mood as well to support emotional wellbeing.  - We will continue to monitor closely alongside PCP / other specialists.  Will refill Wellbutrin as follows: - buPROPion (WELLBUTRIN SR) 150 MG 12 hr tablet; Take 1 tablet (150 mg total) by mouth 2 (two) times daily.  Dispense: 60 tablet; Refill: 0  3. At increased risk of exposure to COVID-19 virus Victoria Garcia was given approximately 9 minutes of coronary artery disease prevention counseling today. She is 61 y.o. female  and has risk factors for heart disease including obesity. We discussed intensive lifestyle modifications today with an emphasis on specific weight loss instructions and strategies.  Repetitive spaced learning was employed today to elicit superior memory formation and behavioral change.    4. Obesity, Current BMI 30.8 Victoria Garcia is currently in the action stage of change. As such, her goal is to continue with weight loss efforts. She has agreed to the Category 2 Plan with  breakfast options. Victoria Garcia was given our snack ideas, meal planning, protein equivalents, and 100 snack calorie handouts.  Will refill Saxenda as follows: - Liraglutide -Weight Management (SAXENDA) 18 MG/3ML SOPN; Inject 3 mg into the skin daily.  Dispense: 15 mL; Refill: 0 Exercise goals: As is  Behavioral modification strategies: keeping healthy foods in the home and planning for success.  Victoria Garcia has agreed to follow-up with our clinic in 3 weeks. She was informed of the importance of frequent follow-up visits to maximize her success with intensive lifestyle modifications for her multiple health conditions.   Objective:   Blood pressure 119/76, pulse 77, temperature 98.2 F (36.8 C), height 5\' 5"  (1.651 m), weight 184 lb (83.5 kg), SpO2 99 %. Body mass index is 30.62 kg/m.  General: Cooperative, alert, well developed, in no acute distress. HEENT: Conjunctivae and lids unremarkable. Cardiovascular: Regular rhythm.  Lungs: Normal work of breathing. Neurologic: No focal deficits.   Lab Results  Component Value Date   CREATININE 0.81 01/12/2022   BUN 16 01/12/2022   NA 139 01/12/2022   K 4.4 01/12/2022   CL 99 01/12/2022   CO2 27 01/12/2022   Lab Results  Component Value Date   ALT 29 01/12/2022   AST 23 01/12/2022   ALKPHOS 90 01/12/2022   BILITOT 0.6 01/12/2022   Lab Results  Component Value Date   HGBA1C 6.1 (H) 01/12/2022   HGBA1C 6.0 (H) 06/17/2021   HGBA1C 6.0 (H) 01/04/2021   HGBA1C 5.9 (H) 08/17/2020   HGBA1C 6.1 (H) 03/30/2020   Lab Results  Component Value Date   INSULIN 14.3 10/27/2020   Lab Results  Component Value Date   TSH 0.859 10/27/2020   Lab Results  Component Value Date   CHOL 228 (H) 01/12/2022   HDL 54 01/12/2022   LDLCALC 157 (H) 01/12/2022   LDLDIRECT 85 06/17/2021   TRIG 97 01/12/2022   CHOLHDL 4.2 01/12/2022   Lab Results  Component Value Date   VD25OH 48.7 01/12/2022   VD25OH 54 08/17/2020   VD25OH 58 08/05/2019   Lab  Results  Component Value Date   WBC 6.7 01/12/2022   HGB 14.2 01/12/2022   HCT 42.6 01/12/2022   MCV 91 01/12/2022   PLT 223 01/12/2022   Lab Results  Component Value Date   IRON 50 09/24/2014   Attestation Statements:   Reviewed by clinician on day of visit: allergies, medications, problem list, medical history, surgical history, family history, social history, and previous encounter notes.  I07/08/2014, CMA, am acting as transcriptionist for Dr. Paulla Fore, DO.   I have reviewed the above documentation for accuracy and completeness, and I agree with the above. Sharee Holster, D.O.  The 21st Century Cures Act was signed into law in 2016 which includes the topic of electronic health records.  This provides immediate access to information in MyChart.  This includes consultation notes, operative notes, office notes, lab results and pathology reports.  If you have any questions about what you read please let 2017 know at your  next visit so we can discuss your concerns and take corrective action if need be.  We are right here with you.

## 2022-02-15 ENCOUNTER — Other Ambulatory Visit (HOSPITAL_COMMUNITY): Payer: Self-pay

## 2022-03-01 ENCOUNTER — Encounter: Payer: Self-pay | Admitting: Internal Medicine

## 2022-03-08 ENCOUNTER — Ambulatory Visit (INDEPENDENT_AMBULATORY_CARE_PROVIDER_SITE_OTHER): Payer: No Typology Code available for payment source | Admitting: Family Medicine

## 2022-04-04 ENCOUNTER — Telehealth (INDEPENDENT_AMBULATORY_CARE_PROVIDER_SITE_OTHER): Payer: Self-pay

## 2022-04-04 NOTE — Telephone Encounter (Signed)
[employer] Approval  PA reference # [id number] Bernie Covey has been approved effective from 04/13/2022 to 04/13/2023, as long as pt is enrolled as a member of her current health plan.  This request has been approved for 24mL (5 pens) per 30 days.   Pt has been contacted.

## 2022-04-12 ENCOUNTER — Encounter (INDEPENDENT_AMBULATORY_CARE_PROVIDER_SITE_OTHER): Payer: Self-pay | Admitting: Family Medicine

## 2022-04-12 ENCOUNTER — Ambulatory Visit (INDEPENDENT_AMBULATORY_CARE_PROVIDER_SITE_OTHER): Payer: No Typology Code available for payment source | Admitting: Family Medicine

## 2022-04-12 ENCOUNTER — Other Ambulatory Visit (HOSPITAL_COMMUNITY): Payer: Self-pay

## 2022-04-12 VITALS — BP 130/84 | HR 82 | Temp 98.8°F | Ht 65.0 in | Wt 186.4 lb

## 2022-04-12 DIAGNOSIS — J3089 Other allergic rhinitis: Secondary | ICD-10-CM | POA: Insufficient documentation

## 2022-04-12 DIAGNOSIS — E669 Obesity, unspecified: Secondary | ICD-10-CM | POA: Diagnosis not present

## 2022-04-12 DIAGNOSIS — Z6831 Body mass index (BMI) 31.0-31.9, adult: Secondary | ICD-10-CM

## 2022-04-12 DIAGNOSIS — E66811 Obesity, class 1: Secondary | ICD-10-CM

## 2022-04-12 DIAGNOSIS — F39 Unspecified mood [affective] disorder: Secondary | ICD-10-CM

## 2022-04-12 DIAGNOSIS — R632 Polyphagia: Secondary | ICD-10-CM | POA: Diagnosis not present

## 2022-04-12 DIAGNOSIS — E663 Overweight: Secondary | ICD-10-CM | POA: Insufficient documentation

## 2022-04-12 MED ORDER — SAXENDA 18 MG/3ML ~~LOC~~ SOPN
3.0000 mg | PEN_INJECTOR | Freq: Every day | SUBCUTANEOUS | 1 refills | Status: DC
  Filled 2022-04-12: qty 15, 30d supply, fill #0
  Filled 2022-05-08: qty 15, 30d supply, fill #1

## 2022-04-12 MED ORDER — LEVOCETIRIZINE DIHYDROCHLORIDE 5 MG PO TABS
5.0000 mg | ORAL_TABLET | Freq: Every evening | ORAL | 0 refills | Status: DC
  Filled 2022-04-12: qty 60, 60d supply, fill #0

## 2022-04-12 MED ORDER — BUPROPION HCL ER (SR) 150 MG PO TB12
150.0000 mg | ORAL_TABLET | Freq: Two times a day (BID) | ORAL | 1 refills | Status: DC
  Filled 2022-04-12: qty 60, 30d supply, fill #0
  Filled 2022-05-08: qty 60, 30d supply, fill #1

## 2022-04-20 ENCOUNTER — Other Ambulatory Visit (HOSPITAL_COMMUNITY): Payer: Self-pay

## 2022-04-26 NOTE — Progress Notes (Signed)
Chief Complaint:   OBESITY Victoria Garcia is here to discuss her progress with her obesity treatment plan along with follow-up of her obesity related diagnoses. [name] is on the Category 2 Plan + breakfast options and states she is following her eating plan approximately 60% of the time. [name] states she is walking 45 minutes 2 times per week.  Today's visit was #: 24 Starting weight: 200 lbs Starting date: 10/27/2020 Today's weight: 186 lbs Today's date: 04/11/2022 Total lbs lost to date: 14 lbs Total lbs lost since last in-office visit: +2 lbs  Interim History: Work has been a little stressful but patient handling it well.  Being more intentional and mindful with food and water.   Subjective:   1. Polyphagia Well controlled overall.   2. Environmental and seasonal allergies She has no symptoms, no side effects.  She is tolerating her medications well.    3. Mood disorder (HCC), emotional eating She is taking Wellbutrin every morning.  She occasionally skips and forgets second dose, but mood is stable, no side effects, denies emotional eating.   Assessment/Plan:  No orders of the defined types were placed in this encounter.   Medications Discontinued During This Encounter  Medication Reason   levocetirizine (XYZAL) 5 MG tablet Reorder   Liraglutide -Weight Management (SAXENDA) 18 MG/3ML SOPN Reorder   buPROPion (WELLBUTRIN SR) 150 MG 12 hr tablet Reorder     Meds ordered this encounter  Medications   Liraglutide -Weight Management (SAXENDA) 18 MG/3ML SOPN    Sig: Inject 3 mg into the skin daily.    Dispense:  15 mL    Refill:  1   buPROPion (WELLBUTRIN SR) 150 MG 12 hr tablet    Sig: Take 1 tablet (150 mg total) by mouth 2 (two) times daily.    Dispense:  60 tablet    Refill:  1    Ov for rf   levocetirizine (XYZAL) 5 MG tablet    Sig: Take 1 tablet (5 mg total) by mouth every evening.    Dispense:  60 tablet    Refill:  0     1. Polyphagia Refill -  Liraglutide -Weight Management (SAXENDA) 18 MG/3ML SOPN; Inject 3 mg into the skin daily.  Dispense: 15 mL; Refill: 1  2. Environmental and seasonal allergies Refill - levocetirizine (XYZAL) 5 MG tablet; Take 1 tablet (5 mg total) by mouth every evening.  Dispense: 60 tablet; Refill: 0  3. Mood disorder (HCC), emotional eating Refill - buPROPion (WELLBUTRIN SR) 150 MG 12 hr tablet; Take 1 tablet (150 mg total) by mouth 2 (two) times daily.  Dispense: 60 tablet; Refill: 1  4. Obesity, Current BMI 31.0 Thanksgiving handouts given to patient.   Refill - Liraglutide -Weight Management (SAXENDA) 18 MG/3ML SOPN; Inject 3 mg into the skin daily.  Dispense: 15 mL; Refill: 1  [name] is currently in the action stage of change. As such, her goal is to continue with weight loss efforts. She has agreed to the Category 2 Plan+ breakfast options.    Exercise goals:  As is.   Behavioral modification strategies: increasing lean protein intake and holiday eating strategies .  [name] has agreed to follow-up with our clinic in 4 weeks. She was informed of the importance of frequent follow-up visits to maximize her success with intensive lifestyle modifications for her multiple health conditions.   Objective:   Blood pressure 130/84, pulse 82, temperature 98.8 F (37.1 C), height 5\' 5"  (1.651 m), weight 186  lb 6.4 oz (84.6 kg), SpO2 99 %. Body mass index is 31.02 kg/m.  General: Cooperative, alert, well developed, in no acute distress. HEENT: Conjunctivae and lids unremarkable. Cardiovascular: Regular rhythm.  Lungs: Normal work of breathing. Neurologic: No focal deficits.   Lab Results  Component Value Date   CREATININE 0.81 01/12/2022   BUN 16 01/12/2022   NA 139 01/12/2022   K 4.4 01/12/2022   CL 99 01/12/2022   CO2 27 01/12/2022   Lab Results  Component Value Date   ALT 29 01/12/2022   AST 23 01/12/2022   ALKPHOS 90 01/12/2022   BILITOT 0.6 01/12/2022   Lab Results  Component Value  Date   HGBA1C 6.1 (H) 01/12/2022   HGBA1C 6.0 (H) 06/17/2021   HGBA1C 6.0 (H) 01/04/2021   HGBA1C 5.9 (H) 08/17/2020   HGBA1C 6.1 (H) 03/30/2020   Lab Results  Component Value Date   INSULIN 14.3 10/27/2020   Lab Results  Component Value Date   TSH 0.859 10/27/2020   Lab Results  Component Value Date   CHOL 228 (H) 01/12/2022   HDL 54 01/12/2022   LDLCALC 157 (H) 01/12/2022   LDLDIRECT 85 06/17/2021   TRIG 97 01/12/2022   CHOLHDL 4.2 01/12/2022   Lab Results  Component Value Date   VD25OH 48.7 01/12/2022   VD25OH 54 08/17/2020   VD25OH 58 08/05/2019   Lab Results  Component Value Date   WBC 6.7 01/12/2022   HGB 14.2 01/12/2022   HCT 42.6 01/12/2022   MCV 91 01/12/2022   PLT 223 01/12/2022   Lab Results  Component Value Date   IRON 50 09/24/2014   Attestation Statements:   Reviewed by clinician on day of visit: allergies, medications, problem list, medical history, surgical history, family history, social history, and previous encounter notes.  I, Malcolm Metro, RMA, am acting as Energy manager for [employer], DO.  I have reviewed the above documentation for accuracy and completeness, and I agree with the above. [name], D.O.  The 21st Century Cures Act was signed into law in 2016 which includes the topic of electronic health records.  This provides immediate access to information in MyChart.  This includes consultation notes, operative notes, office notes, lab results and pathology reports.  If you have any questions about what you read please let us know at your next visit so we can discuss your concerns and take corrective action if need be.  We are right here with you.

## 2022-04-29 ENCOUNTER — Encounter (INDEPENDENT_AMBULATORY_CARE_PROVIDER_SITE_OTHER): Payer: Self-pay | Admitting: Family Medicine

## 2022-05-09 ENCOUNTER — Ambulatory Visit (INDEPENDENT_AMBULATORY_CARE_PROVIDER_SITE_OTHER): Payer: No Typology Code available for payment source | Admitting: Physician Assistant

## 2022-06-13 ENCOUNTER — Encounter (INDEPENDENT_AMBULATORY_CARE_PROVIDER_SITE_OTHER): Payer: Self-pay | Admitting: Physician Assistant

## 2022-06-13 ENCOUNTER — Other Ambulatory Visit (HOSPITAL_COMMUNITY): Payer: Self-pay

## 2022-06-13 ENCOUNTER — Ambulatory Visit (INDEPENDENT_AMBULATORY_CARE_PROVIDER_SITE_OTHER): Payer: Commercial Managed Care - PPO | Admitting: Physician Assistant

## 2022-06-13 VITALS — BP 117/77 | HR 80 | Temp 98.0°F | Ht 65.0 in

## 2022-06-13 DIAGNOSIS — E559 Vitamin D deficiency, unspecified: Secondary | ICD-10-CM | POA: Diagnosis not present

## 2022-06-13 DIAGNOSIS — J3089 Other allergic rhinitis: Secondary | ICD-10-CM | POA: Diagnosis not present

## 2022-06-13 DIAGNOSIS — F39 Unspecified mood [affective] disorder: Secondary | ICD-10-CM

## 2022-06-13 DIAGNOSIS — R632 Polyphagia: Secondary | ICD-10-CM | POA: Diagnosis not present

## 2022-06-13 DIAGNOSIS — R7303 Prediabetes: Secondary | ICD-10-CM | POA: Diagnosis not present

## 2022-06-13 DIAGNOSIS — Z6834 Body mass index (BMI) 34.0-34.9, adult: Secondary | ICD-10-CM

## 2022-06-13 DIAGNOSIS — R5383 Other fatigue: Secondary | ICD-10-CM

## 2022-06-13 DIAGNOSIS — E7849 Other hyperlipidemia: Secondary | ICD-10-CM

## 2022-06-13 DIAGNOSIS — E669 Obesity, unspecified: Secondary | ICD-10-CM

## 2022-06-13 DIAGNOSIS — Z6831 Body mass index (BMI) 31.0-31.9, adult: Secondary | ICD-10-CM | POA: Diagnosis not present

## 2022-06-13 MED ORDER — INSULIN PEN NEEDLE 31G X 5 MM MISC
0 refills | Status: DC
  Filled 2022-06-13: qty 100, 90d supply, fill #0

## 2022-06-13 MED ORDER — SAXENDA 18 MG/3ML ~~LOC~~ SOPN
3.0000 mg | PEN_INJECTOR | Freq: Every day | SUBCUTANEOUS | 1 refills | Status: DC
  Filled 2022-06-13 – 2022-07-04 (×2): qty 15, 30d supply, fill #0

## 2022-06-13 MED ORDER — BUPROPION HCL ER (SR) 150 MG PO TB12
150.0000 mg | ORAL_TABLET | Freq: Two times a day (BID) | ORAL | 1 refills | Status: DC
  Filled 2022-06-13: qty 60, 30d supply, fill #0
  Filled 2022-07-14 – 2022-07-20 (×3): qty 60, 30d supply, fill #1

## 2022-06-13 MED ORDER — LEVOCETIRIZINE DIHYDROCHLORIDE 5 MG PO TABS
5.0000 mg | ORAL_TABLET | Freq: Every evening | ORAL | 0 refills | Status: DC
  Filled 2022-06-13: qty 60, 60d supply, fill #0

## 2022-06-14 ENCOUNTER — Other Ambulatory Visit: Payer: Self-pay

## 2022-06-14 ENCOUNTER — Other Ambulatory Visit (HOSPITAL_COMMUNITY): Payer: Self-pay

## 2022-06-14 LAB — CMP14+EGFR
ALT: 30 IU/L (ref 0–32)
AST: 20 IU/L (ref 0–40)
Albumin/Globulin Ratio: 1.4 (ref 1.2–2.2)
Albumin: 4.1 g/dL (ref 3.8–4.9)
Alkaline Phosphatase: 94 IU/L (ref 44–121)
BUN/Creatinine Ratio: 24 (ref 12–28)
BUN: 18 mg/dL (ref 8–27)
Bilirubin Total: 0.7 mg/dL (ref 0.0–1.2)
CO2: 27 mmol/L (ref 20–29)
Calcium: 9.4 mg/dL (ref 8.7–10.3)
Chloride: 104 mmol/L (ref 96–106)
Creatinine, Ser: 0.75 mg/dL (ref 0.57–1.00)
Globulin, Total: 3 g/dL (ref 1.5–4.5)
Glucose: 90 mg/dL (ref 70–99)
Potassium: 4.4 mmol/L (ref 3.5–5.2)
Sodium: 143 mmol/L (ref 134–144)
Total Protein: 7.1 g/dL (ref 6.0–8.5)
eGFR: 91 mL/min/{1.73_m2} (ref >59)

## 2022-06-14 LAB — LIPID PANEL WITH LDL/HDL RATIO
Cholesterol, Total: 144 mg/dL (ref 100–199)
HDL: 49 mg/dL (ref >39)
LDL Chol Calc (NIH): 81 mg/dL (ref 0–99)
LDL/HDL Ratio: 1.7 ratio (ref 0.0–3.2)
Triglycerides: 71 mg/dL (ref 0–149)
VLDL Cholesterol Cal: 14 mg/dL (ref 5–40)

## 2022-06-14 LAB — HEMOGLOBIN A1C
Est. average glucose Bld gHb Est-mCnc: 126 mg/dL
Hgb A1c MFr Bld: 6 % — ABNORMAL HIGH (ref 4.8–5.6)

## 2022-06-14 LAB — CBC WITH DIFFERENTIAL/PLATELET
Basophils Absolute: 0 10*3/uL (ref 0.0–0.2)
Basos: 1 %
EOS (ABSOLUTE): 0.1 10*3/uL (ref 0.0–0.4)
Eos: 2 %
Hematocrit: 39.5 % (ref 34.0–46.6)
Hemoglobin: 13.3 g/dL (ref 11.1–15.9)
Immature Grans (Abs): 0 10*3/uL (ref 0.0–0.1)
Immature Granulocytes: 0 %
Lymphocytes Absolute: 1.7 10*3/uL (ref 0.7–3.1)
Lymphs: 28 %
MCH: 30.9 pg (ref 26.6–33.0)
MCHC: 33.7 g/dL (ref 31.5–35.7)
MCV: 92 fL (ref 79–97)
Monocytes Absolute: 0.4 10*3/uL (ref 0.1–0.9)
Monocytes: 7 %
Neutrophils Absolute: 3.8 10*3/uL (ref 1.4–7.0)
Neutrophils: 62 %
Platelets: 216 10*3/uL (ref 150–450)
RBC: 4.31 x10E6/uL (ref 3.77–5.28)
RDW: 12.2 % (ref 11.7–15.4)
WBC: 6.1 10*3/uL (ref 3.4–10.8)

## 2022-06-14 LAB — VITAMIN B12: Vitamin B-12: 850 pg/mL (ref 232–1245)

## 2022-06-14 LAB — TSH: TSH: 1.38 u[IU]/mL (ref 0.450–4.500)

## 2022-06-14 LAB — INSULIN, RANDOM: INSULIN: 13.8 u[IU]/mL (ref 2.6–24.9)

## 2022-06-14 LAB — VITAMIN D 25 HYDROXY (VIT D DEFICIENCY, FRACTURES): Vit D, 25-Hydroxy: 55.7 ng/mL (ref 30.0–100.0)

## 2022-06-15 ENCOUNTER — Other Ambulatory Visit (HOSPITAL_COMMUNITY): Payer: Self-pay

## 2022-06-21 NOTE — Progress Notes (Signed)
Chief Complaint:   OBESITY Victoria Garcia is here to discuss her progress with her obesity treatment plan along with follow-up of her obesity related diagnoses. Victoria Garcia is on the Category 2 Plan and states she is following her eating plan approximately 50% of the time. [name] states she is walking 30 minutes 1 times per week.  Today's visit was #: 25 Starting weight: 200 lbs Starting date: 10/27/2020 Today's weight: 191 lbs Today's date: 06/13/2022 Total lbs lost to date: 9 lbs Total lbs lost since last in-office visit: 0  Interim History: Victoria Garcia reports she struggled to stay on her nutritional plan over the holidays.  Her daughter and son recently were ill as well making it even more difficult to focus on her nutrition plan. She reports increased stressors at work as well. She reports she has had an increased appetite and hunger lately and is doing more comfort eating.  She has been on Saxenda 3 mg daily.  She reports no side effects with Saxenda such as nausea vomiting diarrhea constipation, no difficulty swallowing or mass in the neck.  Subjective:   1. Fatigue, unspecified type [name] endorses fatigue.  Takes multivitamin daily.  2. Vitamin D deficiency Victoria Garcia is taking over-the-counter vitamin D 2000 units daily.  Denies side effects.  Last vitamin D of 48.7-nearing goal of 50-70 on 01/12/2022.  3. Environmental and seasonal allergies Victoria Garcia is taking Xyzal with good control of allergies.  No side effects.  4. Other hyperlipidemia Victoria Garcia is taking Lipitor 40 mg daily.  Last LDL 157, HDL of 54, trig of 97 on 01/12/2022.  LDL not at goal.  5. Polyphagia On Saxenda 3 mg daily with no side effects.  Last A1c at 6.1 on 01/12/2022-nearing goal.    6. Pre-diabetes A1c 6.1-nearing goal on 01/12/2022/no recent insulin level.  On GLP-1 medication, Saxenda.  7. Mood disorder ([place]), emotional eating Taking Wellbutrin 150 mg daily takes it all in the morning.  Still some cravings midday-  evening.  Assessment/Plan:   1. Fatigue, unspecified type We will obtain labs today.  Continue multivitamin.  Continue Prescribed Nutrition Plan and exercise to promote weight loss.  - Vitamin B12 - CBC with Differential/Platelet - TSH  2. Vitamin D deficiency We will obtain labs today.  Continue over-the-counter vitamin D.  Will adjust dose/supplementation as indicated by labs.  - VITAMIN D 25 Hydroxy (Vit-D Deficiency, Fractures)  3. Environmental and seasonal allergies Refill Xyzal 5 mg once daily for 2 months with 0 refills.   -Refill levocetirizine (XYZAL) 5 MG tablet; Take 1 tablet (5 mg total) by mouth every evening.  Dispense: 60 tablet; Refill: 0  4. Other hyperlipidemia We will obtain labs today.  Continue taking Lipitor.  Continue Prescribed Nutrition Plan and exercise to promote weight loss and improve lipids.  - Lipid Panel With LDL/HDL Ratio  5. Polyphagia We will obtain labs today.  Continue Saxenda.   Continue Prescribed Nutrition Plan and exercise to promote weight loss.  6. Pre-diabetes We will obtain labs today.  Continue Prescribed Nutrition Plan and exercise to promote weight loss/improved glycemic control/prevent development of type 2 diabetes..  - CMP14+EGFR - Hemoglobin A1c - Insulin, random  7. Mood disorder (Victoria Garcia), emotional eating Refill Wellbutrin SR 150 mg twice daily for 1 month with 0 refills.  Referral back to Dr. Mallie Mussel with recent increased stress.  Strategies for emotional eating behavior were discussed.  -Refill buPROPion (WELLBUTRIN SR) 150 MG 12 hr tablet; Take 1 tablet (150 mg total) by mouth  2 (two) times daily.  Dispense: 60 tablet; Refill: 1  8. Obesity, Current BMI 31.8 Refill Saxenda 3 mg subcu daily for 1 month with 0 refills.  -Refill Liraglutide -Weight Management (SAXENDA) 18 MG/3ML SOPN; Inject 3 mg into the skin daily.  Dispense: 15 mL; Refill: 1 -Refill Insulin Pen Needle 31G X 5 MM MISC; Take as directed with Saxenda   Dispense: 100 each; Refill: 0   Getsemani is currently in the action stage of change. As such, her goal is to continue with weight loss efforts. She has agreed to the Category 2 Plan.   Exercise goals: As is.  Behavioral modification strategies: increasing lean protein intake, decreasing simple carbohydrates, emotional eating strategies, and planning for success.  [name] has agreed to follow-up with our clinic in 4 weeks. She was informed of the importance of frequent follow-up visits to maximize her success with intensive lifestyle modifications for her multiple health conditions.   Victoria Garcia was informed we would discuss her lab results at her next visit unless there is a critical issue that needs to be addressed sooner. Victoria Garcia agreed to keep her next visit at the agreed upon time to discuss these results.  Objective:   Blood pressure 117/77, pulse 80, temperature 98 F (36.7 C), height 5\' 5"  (1.651 m), SpO2 98 %. Body mass index is 31.02 kg/m.  General: Cooperative, alert, well developed, in no acute distress. HEENT: Conjunctivae and lids unremarkable. Cardiovascular: Regular rhythm.  Lungs: Normal work of breathing. Neurologic: No focal deficits.   Lab Results  Component Value Date   CREATININE 0.75 06/13/2022   BUN 18 06/13/2022   NA 143 06/13/2022   K 4.4 06/13/2022   CL 104 06/13/2022   CO2 27 06/13/2022   Lab Results  Component Value Date   ALT 30 06/13/2022   AST 20 06/13/2022   ALKPHOS 94 06/13/2022   BILITOT 0.7 06/13/2022   Lab Results  Component Value Date   HGBA1C 6.0 (H) 06/13/2022   HGBA1C 6.1 (H) 01/12/2022   HGBA1C 6.0 (H) 06/17/2021   HGBA1C 6.0 (H) 01/04/2021   HGBA1C 5.9 (H) 08/17/2020   Lab Results  Component Value Date   INSULIN 13.8 06/13/2022   INSULIN 14.3 10/27/2020   Lab Results  Component Value Date   TSH 1.380 06/13/2022   Lab Results  Component Value Date   CHOL 144 06/13/2022   HDL 49 06/13/2022   LDLCALC 81 06/13/2022    LDLDIRECT 85 06/17/2021   TRIG 71 06/13/2022   CHOLHDL 4.2 01/12/2022   Lab Results  Component Value Date   VD25OH 55.7 06/13/2022   VD25OH 48.7 01/12/2022   VD25OH 54 08/17/2020   Lab Results  Component Value Date   WBC 6.1 06/13/2022   HGB 13.3 06/13/2022   HCT 39.5 06/13/2022   MCV 92 06/13/2022   PLT 216 06/13/2022   Lab Results  Component Value Date   IRON 50 09/24/2014   Attestation Statements:   Reviewed by clinician on day of visit: allergies, medications, problem list, medical history, surgical history, family history, social history, and previous encounter notes.  I, Brendell Tyus, am acting as transcriptionist for [employer], PA.  I have reviewed the above documentation for accuracy and completeness, and I agree with the above. -  Asaad Gulley,PA-C

## 2022-06-23 ENCOUNTER — Other Ambulatory Visit (HOSPITAL_COMMUNITY): Payer: Self-pay

## 2022-07-04 ENCOUNTER — Other Ambulatory Visit (HOSPITAL_COMMUNITY): Payer: Self-pay

## 2022-07-15 ENCOUNTER — Other Ambulatory Visit (HOSPITAL_COMMUNITY): Payer: Self-pay

## 2022-07-18 ENCOUNTER — Ambulatory Visit (INDEPENDENT_AMBULATORY_CARE_PROVIDER_SITE_OTHER): Payer: Commercial Managed Care - PPO | Admitting: Family Medicine

## 2022-07-19 ENCOUNTER — Other Ambulatory Visit (HOSPITAL_COMMUNITY): Payer: Self-pay

## 2022-07-20 ENCOUNTER — Other Ambulatory Visit (HOSPITAL_COMMUNITY): Payer: Self-pay

## 2022-07-20 ENCOUNTER — Other Ambulatory Visit: Payer: Self-pay

## 2022-08-03 ENCOUNTER — Other Ambulatory Visit (HOSPITAL_COMMUNITY): Payer: Self-pay

## 2022-08-03 ENCOUNTER — Encounter (INDEPENDENT_AMBULATORY_CARE_PROVIDER_SITE_OTHER): Payer: Self-pay | Admitting: Physician Assistant

## 2022-08-03 ENCOUNTER — Ambulatory Visit (INDEPENDENT_AMBULATORY_CARE_PROVIDER_SITE_OTHER): Payer: Commercial Managed Care - PPO | Admitting: Physician Assistant

## 2022-08-03 VITALS — BP 114/69 | HR 71 | Temp 97.9°F | Ht 65.0 in | Wt 196.0 lb

## 2022-08-03 DIAGNOSIS — R7303 Prediabetes: Secondary | ICD-10-CM

## 2022-08-03 DIAGNOSIS — Z6833 Body mass index (BMI) 33.0-33.9, adult: Secondary | ICD-10-CM | POA: Insufficient documentation

## 2022-08-03 DIAGNOSIS — Z6832 Body mass index (BMI) 32.0-32.9, adult: Secondary | ICD-10-CM | POA: Diagnosis not present

## 2022-08-03 DIAGNOSIS — E559 Vitamin D deficiency, unspecified: Secondary | ICD-10-CM

## 2022-08-03 DIAGNOSIS — F39 Unspecified mood [affective] disorder: Secondary | ICD-10-CM

## 2022-08-03 DIAGNOSIS — E669 Obesity, unspecified: Secondary | ICD-10-CM | POA: Diagnosis not present

## 2022-08-03 DIAGNOSIS — J3089 Other allergic rhinitis: Secondary | ICD-10-CM | POA: Diagnosis not present

## 2022-08-03 MED ORDER — LEVOCETIRIZINE DIHYDROCHLORIDE 5 MG PO TABS
5.0000 mg | ORAL_TABLET | Freq: Every evening | ORAL | 0 refills | Status: DC
  Filled 2022-08-03: qty 60, 60d supply, fill #0

## 2022-08-03 MED ORDER — METFORMIN HCL 500 MG PO TABS
500.0000 mg | ORAL_TABLET | Freq: Every day | ORAL | 0 refills | Status: DC
  Filled 2022-08-03: qty 30, 30d supply, fill #0

## 2022-08-03 MED ORDER — BUPROPION HCL ER (SR) 150 MG PO TB12
150.0000 mg | ORAL_TABLET | Freq: Two times a day (BID) | ORAL | 1 refills | Status: DC
  Filled 2022-08-03 – 2022-08-17 (×2): qty 60, 30d supply, fill #0

## 2022-08-03 NOTE — Progress Notes (Signed)
Office: [phone]  /  Fax: [phone]  WEIGHT SUMMARY AND BIOMETRICS  Vitals Temp: 97.9 F (36.6 C) BP: 114/69 Pulse Rate: 71 SpO2: 100 %   Anthropometric Measurements Height: '5\' 5"'$  (1.651 m) Weight: 196 lb (88.9 kg) BMI (Calculated): 32.62 Weight at Last Visit: 191 lb Weight Lost Since Last Visit: 0 Starting Weight: 200 lb Total Weight Loss (lbs): 9 lb (4.082 kg)   Body Composition  Body Fat %: 39.3 % Fat Mass (lbs): 77.2 lbs Muscle Mass (lbs): 113 lbs Total Body Water (lbs): 78 lbs Visceral Fat Rating : 11   Other Clinical Data Fasting: yes Labs: no Today's Visit #: 26 Starting Date: 10/27/20     HPI  Chief Complaint: OBESITY  Victoria Garcia is here to discuss her progress with her obesity treatment plan. She is on the the Category 2 Plan and states she is following her eating plan approximately 50 % of the time. She states she is exercising/walking 45 minutes 2 times per week.   Interval History:  Since last office visit she has been off of GLP-1 therapy- Saxenda for the past couple of months as she will no longer have insurance coverage for GLP- 1 medications (she does not have Type 2 diabetes.) She is struggling with increased appetite and hunger off medication.  She has resumed Wellbutrin for cravings/emotional eating and feels this is helpful. Up 5 lbs over the past month. She is exercising- walking 2 times weekly and we discussed trying to add some gentle strengthening exercises with light weights 10 minutes 3 times weekly or a weighted vest for walking.    Pharmacotherapy: Saxenda discontinued as no longer has insurance coverage.   PHYSICAL EXAM:  Blood pressure 114/69, pulse 71, temperature 97.9 F (36.6 C), height '5\' 5"'$  (1.651 m), weight 196 lb (88.9 kg), SpO2 100 %. Body mass index is 32.62 kg/m.  General: She is overweight, cooperative, alert, well developed, and in no acute distress. PSYCH: Has normal mood, affect and thought process.    Lungs: Normal breathing effort, no conversational dyspnea.  DIAGNOSTIC DATA REVIEWED:  BMET    Component Value Date/Time   NA 143 06/13/2022 0808   K 4.4 06/13/2022 0808   CL 104 06/13/2022 0808   CO2 27 06/13/2022 0808   GLUCOSE 90 06/13/2022 0808   GLUCOSE 98 02/17/2021 0825   BUN 18 06/13/2022 0808   CREATININE 0.75 06/13/2022 0808   CREATININE 0.73 02/17/2021 0825   CALCIUM 9.4 06/13/2022 0808   GFRNONAA 96 08/17/2020 1000   GFRAA 111 08/17/2020 1000   Lab Results  Component Value Date   HGBA1C 6.0 (H) 06/13/2022   HGBA1C 6.5 (H) 05/07/2014   Lab Results  Component Value Date   INSULIN 13.8 06/13/2022   INSULIN 14.3 10/27/2020   Lab Results  Component Value Date   TSH 1.380 06/13/2022   CBC    Component Value Date/Time   WBC 6.1 06/13/2022 0808   WBC 7.0 08/17/2020 1000   RBC 4.31 06/13/2022 0808   RBC 4.48 08/17/2020 1000   HGB 13.3 06/13/2022 0808   HCT 39.5 06/13/2022 0808   PLT 216 06/13/2022 0808   MCV 92 06/13/2022 0808   MCH 30.9 06/13/2022 0808   MCH 30.1 08/17/2020 1000   MCHC 33.7 06/13/2022 0808   MCHC 33.3 08/17/2020 1000   RDW 12.2 06/13/2022 0808   Iron Studies    Component Value Date/Time   IRON 50 09/24/2014 1018   Lipid Panel     Component Value  Date/Time   CHOL 144 06/13/2022 0808   TRIG 71 06/13/2022 0808   HDL 49 06/13/2022 0808   CHOLHDL 4.2 01/12/2022 1552   CHOLHDL 4.4 02/17/2021 0825   VLDL 21 01/13/2017 0924   LDLCALC 81 06/13/2022 0808   LDLCALC 105 (H) 02/17/2021 0825   LDLDIRECT 85 06/17/2021 1026   Hepatic Function Panel     Component Value Date/Time   PROT 7.1 06/13/2022 0808   ALBUMIN 4.1 06/13/2022 0808   AST 20 06/13/2022 0808   ALT 30 06/13/2022 0808   ALKPHOS 94 06/13/2022 0808   BILITOT 0.7 06/13/2022 0808      Component Value Date/Time   TSH 1.380 06/13/2022 0808   Nutritional Lab Results  Component Value Date   VD25OH 55.7 06/13/2022   VD25OH 48.7 01/12/2022   VD25OH 54 08/17/2020     ASSOCIATED CONDITIONS ADDRESSED TODAY  ASSESSMENT AND PLAN  Problem List Items Addressed This Visit     Vitamin D deficiency    Vitamin D Deficiency Vitamin D is at goal of 47.  Most recent vitamin D level was 55.7. She is on OTC vitamin D3 2000 IU daily. Lab Results  Component Value Date   VD25OH 55.7 06/13/2022   VD25OH 48.7 01/12/2022   VD25OH 54 08/17/2020   Plan: Continue OTC vitamin D 2000 IU daily.        Prediabetes - Primary    Prediabetes Last A1c was 6.0  Medication(s): None Lab Results  Component Value Date   HGBA1C 6.0 (H) 06/13/2022   HGBA1C 6.1 (H) 01/12/2022   HGBA1C 6.0 (H) 06/17/2021   HGBA1C 6.0 (H) 01/04/2021   HGBA1C 5.9 (H) 08/17/2020   Lab Results  Component Value Date   INSULIN 13.8 06/13/2022   INSULIN 14.3 10/27/2020   Plan: Continue working on nutrition plan to decrease simple carbohydrates, increase lean proteins and exercise to promote weight loss, improve glycemic control and prevent progression to Type 2 diabetes.  Discussed options for treatment now that she is no longer taking GLP-1- Saxenda as lost insurance coverage.  We discussed starting metformin including risks and benefits and possible side effects and she is agreeable to start metformin at this time.  Start Metformin 500 mg once daily with meals Will plan to recheck A1c and insulin levels again in 2-3 months.        Relevant Medications   metFORMIN (GLUCOPHAGE) 500 MG tablet   Mood disorder (HCC), emotional eating    Resumed wellbutrin SR 150 mg 2 tablets in the morning. Feels it helps with cravings as noted increase when off of Wellbutrin for a couple of weeks. No side effects with Wellbutrin.   Refill and continue Wellbutrin 150 mg 2 tablets in the morning daily.  Continue to work on emotional eating strategies.       Relevant Medications   buPROPion (WELLBUTRIN SR) 150 MG 12 hr tablet   Generalized obesity    Start BMI 34.33      Relevant Medications    metFORMIN (GLUCOPHAGE) 500 MG tablet   Environmental and seasonal allergies    Takes xyzal for seasonal allergies without side effects and requests refill. Refilled xyzal 5 mg daily in evening x 2 months.       Relevant Medications   levocetirizine (XYZAL) 5 MG tablet   BMI 32.0-32.9,adult Current BMI 32.6    Current BMI 32.6         TREATMENT PLAN FOR OBESITY:  Recommended Dietary Goals  Mable is currently in the  action stage of change. As such, her goal is to continue weight management plan. She has agreed to the Category 2 Plan.  Behavioral Intervention  We discussed the following Behavioral Modification Strategies today: increasing lean protein intake, decreasing simple carbohydrates , increasing water intake, work on meal planning and easy cooking plans, and emotional eating strategies and understanding the difference between hunger signals and cravings.  Additional resources provided today: NA  Recommended Physical Activity Goals  Brenda has been advised to work up to 150 minutes of moderate intensity aerobic activity a week and strengthening exercises 2-3 times per week for cardiovascular health, weight loss maintenance and preservation of muscle mass.   She has agreed to increase physical activity in their day and reduce sedentary time (increase NEAT).    Pharmacotherapy We discussed various medication options to help Lajessica with her weight loss efforts and we both agreed to begin metformin for primary indication of prediabetes.    Return in about 4 weeks (around 08/31/2022).Marland Kitchen She was informed of the importance of frequent follow up visits to maximize her success with intensive lifestyle modifications for her multiple health conditions.   ATTESTASTION STATEMENTS:  Reviewed by clinician on day of visit: allergies, medications, problem list, medical history, surgical history, family history, social history, and previous encounter notes.   I have personally spent  42 minutes total time today in preparation, patient care, nutritional counseling and documentation for this visit, including the following: review of clinical lab tests; review of medical tests/procedures/services.      Alexismarie Flaim, PA-C

## 2022-08-03 NOTE — Assessment & Plan Note (Signed)
Takes xyzal for seasonal allergies without side effects and requests refill. Refilled xyzal 5 mg daily in evening x 2 months.

## 2022-08-03 NOTE — Assessment & Plan Note (Signed)
Resumed wellbutrin SR 150 mg 2 tablets in the morning. Feels it helps with cravings as noted increase when off of Wellbutrin for a couple of weeks. No side effects with Wellbutrin.   Refill and continue Wellbutrin 150 mg 2 tablets in the morning daily.  Continue to work on emotional eating strategies.

## 2022-08-03 NOTE — Assessment & Plan Note (Signed)
Start BMI 34.33

## 2022-08-03 NOTE — Assessment & Plan Note (Signed)
Prediabetes Last A1c was 6.0  Medication(s): None Lab Results  Component Value Date   HGBA1C 6.0 (H) 06/13/2022   HGBA1C 6.1 (H) 01/12/2022   HGBA1C 6.0 (H) 06/17/2021   HGBA1C 6.0 (H) 01/04/2021   HGBA1C 5.9 (H) 08/17/2020   Lab Results  Component Value Date   INSULIN 13.8 06/13/2022   INSULIN 14.3 10/27/2020    Plan: Continue working on nutrition plan to decrease simple carbohydrates, increase lean proteins and exercise to promote weight loss, improve glycemic control and prevent progression to Type 2 diabetes.  Discussed options for treatment now that she is no longer taking GLP-1- Saxenda as lost insurance coverage.  We discussed starting metformin including risks and benefits and possible side effects and she is agreeable to start metformin at this time.  Start Metformin 500 mg once daily with meals Will plan to recheck A1c and insulin levels again in 2-3 months.

## 2022-08-03 NOTE — Assessment & Plan Note (Addendum)
>>  ASSESSMENT AND PLAN FOR OBESITY (BMI 30.0-34.9) WRITTEN ON 05/08/2023  3:48 PM BY Kyan Yurkovich, DO   >>ASSESSMENT AND PLAN FOR BMI 33.0-33.9,ADULT WRITTEN ON 08/03/2022 12:26 PM BY RAYBURN, SHAWN MONTGOMERY, PA-C  Current BMI 32.6   >>ASSESSMENT AND PLAN FOR GENERALIZED OBESITY WRITTEN ON 08/03/2022 12:28 PM BY RAYBURN, SHAWN MONTGOMERY, PA-C  Start BMI 34.33

## 2022-08-03 NOTE — Assessment & Plan Note (Signed)
Current BMI 32.6

## 2022-08-03 NOTE — Assessment & Plan Note (Signed)
Vitamin D Deficiency Vitamin D is at goal of 50.  Most recent vitamin D level was 55.7. She is on OTC vitamin D3 2000 IU daily. Lab Results  Component Value Date   VD25OH 55.7 06/13/2022   VD25OH 48.7 01/12/2022   VD25OH 54 08/17/2020    Plan: Continue OTC vitamin D 2000 IU daily.

## 2022-08-18 ENCOUNTER — Other Ambulatory Visit (HOSPITAL_COMMUNITY): Payer: Self-pay

## 2022-08-30 ENCOUNTER — Encounter (INDEPENDENT_AMBULATORY_CARE_PROVIDER_SITE_OTHER): Payer: Self-pay | Admitting: Physician Assistant

## 2022-08-30 ENCOUNTER — Other Ambulatory Visit (HOSPITAL_COMMUNITY): Payer: Self-pay

## 2022-08-30 ENCOUNTER — Ambulatory Visit (INDEPENDENT_AMBULATORY_CARE_PROVIDER_SITE_OTHER): Payer: Commercial Managed Care - PPO | Admitting: Physician Assistant

## 2022-08-30 VITALS — BP 116/74 | HR 73 | Temp 98.3°F | Ht 65.0 in | Wt 197.0 lb

## 2022-08-30 DIAGNOSIS — F39 Unspecified mood [affective] disorder: Secondary | ICD-10-CM

## 2022-08-30 DIAGNOSIS — R7303 Prediabetes: Secondary | ICD-10-CM

## 2022-08-30 DIAGNOSIS — E559 Vitamin D deficiency, unspecified: Secondary | ICD-10-CM

## 2022-08-30 DIAGNOSIS — Z6832 Body mass index (BMI) 32.0-32.9, adult: Secondary | ICD-10-CM | POA: Diagnosis not present

## 2022-08-30 DIAGNOSIS — E669 Obesity, unspecified: Secondary | ICD-10-CM

## 2022-08-30 DIAGNOSIS — J3089 Other allergic rhinitis: Secondary | ICD-10-CM | POA: Diagnosis not present

## 2022-08-30 MED ORDER — LEVOCETIRIZINE DIHYDROCHLORIDE 5 MG PO TABS
5.0000 mg | ORAL_TABLET | Freq: Every evening | ORAL | 0 refills | Status: DC
  Filled 2022-08-30: qty 60, 60d supply, fill #0

## 2022-08-30 MED ORDER — BUPROPION HCL ER (SR) 150 MG PO TB12
300.0000 mg | ORAL_TABLET | Freq: Every day | ORAL | 1 refills | Status: DC
  Filled 2022-08-30 – 2022-09-21 (×4): qty 60, 30d supply, fill #0

## 2022-08-30 MED ORDER — METFORMIN HCL 500 MG PO TABS
500.0000 mg | ORAL_TABLET | Freq: Every day | ORAL | 0 refills | Status: DC
Start: 2022-08-30 — End: 2022-09-26
  Filled 2022-08-30: qty 30, 30d supply, fill #0

## 2022-08-30 NOTE — Assessment & Plan Note (Signed)
Prediabetes Last A1c was 6.0/ Insulin 13.8- not at goal.   Medication(s): Metformin 500 mg once daily breakfast Lab Results  Component Value Date   HGBA1C 6.0 (H) 06/13/2022   HGBA1C 6.1 (H) 01/12/2022   HGBA1C 6.0 (H) 06/17/2021   HGBA1C 6.0 (H) 01/04/2021   HGBA1C 5.9 (H) 08/17/2020   Lab Results  Component Value Date   INSULIN 13.8 06/13/2022   INSULIN 14.3 10/27/2020    Plan: Continue and refill Metformin 500 mg once daily breakfast Continue working on nutrition plan to decrease simple carbohydrates, increase lean proteins and exercise to promote weight loss, improve glycemic control and prevent progression to Type 2 diabetes.

## 2022-08-30 NOTE — Progress Notes (Signed)
Office: [phone]  /  Fax: [phone]  WEIGHT SUMMARY AND BIOMETRICS  Vitals Temp: 98.3 F (36.8 C) BP: 116/74 Pulse Rate: 73 SpO2: 99 %   Anthropometric Measurements Height: 5\' 5"  (1.651 m) Weight: 197 lb (89.4 kg) BMI (Calculated): 32.78 Weight at Last Visit: 196 lb Weight Lost Since Last Visit: 0 lb Weight Gained Since Last Visit: 1 lb Starting Weight: 200 lb Total Weight Loss (lbs): 8 lb (3.629 kg)   Body Composition  Body Fat %: 38.9 % Fat Mass (lbs): 76.6 lbs Muscle Mass (lbs): 114.4 lbs Total Body Water (lbs): 77.8 lbs Visceral Fat Rating : 11   Other Clinical Data Fasting: yes Labs: No Today's Visit #: 27 Starting Date: 10/27/20     HPI  Chief Complaint: OBESITY  Victoria Garcia is here to discuss her progress with her obesity treatment plan. She is on the the Category 2 Plan and states she is following her eating plan approximately 50 % of the time. She states she is exercising/walking 45 minutes 3 times per week.   Interval History:  Since last office visit she is up 1 lb. But up in muscle mass rather than adipose.  Bio impedence scale : Up 1.4 lbs in muscle mass, down 0.8 lbs adipose mass-  She is walking more at work, wearing lead apron as well and averaging 10K steps daily now.  Doing well with meal planning and prepping.  Good adherence to nutrition plan.  Sleep- not consistently restorative  Pharmacotherapy: Metformin- changed to morning dosing and this seems to help control hunger during day better. No side effects with metformin.  On Ozempic/Wegovy and then Saxenda in 2023 but no insurance coverage since Jan. 2024,and off all GLP-1 medications.   PHYSICAL EXAM:  Blood pressure 116/74, pulse 73, temperature 98.3 F (36.8 C), height 5\' 5"  (1.651 m), weight 197 lb (89.4 kg), SpO2 99 %. Body mass index is 32.78 kg/m.  General: She is overweight, cooperative, alert, well developed, and in no acute distress. Cardiovascular: regular  rhythm PSYCH: Has normal mood, affect and thought process.   Lungs: Normal breathing effort, no conversational dyspnea. Neuro: no focal deficits  DIAGNOSTIC DATA REVIEWED:  BMET    Component Value Date/Time   NA 143 06/13/2022 0808   K 4.4 06/13/2022 0808   CL 104 06/13/2022 0808   CO2 27 06/13/2022 0808   GLUCOSE 90 06/13/2022 0808   GLUCOSE 98 02/17/2021 0825   BUN 18 06/13/2022 0808   CREATININE 0.75 06/13/2022 0808   CREATININE 0.73 02/17/2021 0825   CALCIUM 9.4 06/13/2022 0808   GFRNONAA 96 08/17/2020 1000   GFRAA 111 08/17/2020 1000   Lab Results  Component Value Date   HGBA1C 6.0 (H) 06/13/2022   HGBA1C 6.5 (H) 05/07/2014   Lab Results  Component Value Date   INSULIN 13.8 06/13/2022   INSULIN 14.3 10/27/2020   Lab Results  Component Value Date   TSH 1.380 06/13/2022   CBC    Component Value Date/Time   WBC 6.1 06/13/2022 0808   WBC 7.0 08/17/2020 1000   RBC 4.31 06/13/2022 0808   RBC 4.48 08/17/2020 1000   HGB 13.3 06/13/2022 0808   HCT 39.5 06/13/2022 0808   PLT 216 06/13/2022 0808   MCV 92 06/13/2022 0808   MCH 30.9 06/13/2022 0808   MCH 30.1 08/17/2020 1000   MCHC 33.7 06/13/2022 0808   MCHC 33.3 08/17/2020 1000   RDW 12.2 06/13/2022 0808   Iron Studies    Component Value Date/Time  IRON 50 09/24/2014 1018   Lipid Panel     Component Value Date/Time   CHOL 144 06/13/2022 0808   TRIG 71 06/13/2022 0808   HDL 49 06/13/2022 0808   CHOLHDL 4.2 01/12/2022 1552   CHOLHDL 4.4 02/17/2021 0825   VLDL 21 01/13/2017 0924   LDLCALC 81 06/13/2022 0808   LDLCALC 105 (H) 02/17/2021 0825   LDLDIRECT 85 06/17/2021 1026   Hepatic Function Panel     Component Value Date/Time   PROT 7.1 06/13/2022 0808   ALBUMIN 4.1 06/13/2022 0808   AST 20 06/13/2022 0808   ALT 30 06/13/2022 0808   ALKPHOS 94 06/13/2022 0808   BILITOT 0.7 06/13/2022 0808      Component Value Date/Time   TSH 1.380 06/13/2022 0808   Nutritional Lab Results  Component Value  Date   VD25OH 55.7 06/13/2022   VD25OH 48.7 01/12/2022   VD25OH 54 08/17/2020    ASSOCIATED CONDITIONS ADDRESSED TODAY  ASSESSMENT AND PLAN  Problem List Items Addressed This Visit     Vitamin D deficiency    Vitamin D Deficiency Vitamin D is at goal of 50.  Most recent vitamin D level was 55.7. She is on OTC vitamin D3 2000 IU daily. Lab Results  Component Value Date   VD25OH 55.7 06/13/2022   VD25OH 48.7 01/12/2022   VD25OH 54 08/17/2020   Plan: Continue OTC vitamin D3 2000 IU daily Low vitamin D levels can be associated with adiposity and may result in leptin resistance and weight gain. Also associated with fatigue. Currently on vitamin D supplementation without any adverse effects.         Prediabetes - Primary    Prediabetes Last A1c was 6.0/ Insulin 13.8- not at goal.   Medication(s): Metformin 500 mg once daily breakfast Lab Results  Component Value Date   HGBA1C 6.0 (H) 06/13/2022   HGBA1C 6.1 (H) 01/12/2022   HGBA1C 6.0 (H) 06/17/2021   HGBA1C 6.0 (H) 01/04/2021   HGBA1C 5.9 (H) 08/17/2020   Lab Results  Component Value Date   INSULIN 13.8 06/13/2022   INSULIN 14.3 10/27/2020   Plan: Continue and refill Metformin 500 mg once daily breakfast Continue working on nutrition plan to decrease simple carbohydrates, increase lean proteins and exercise to promote weight loss, improve glycemic control and prevent progression to Type 2 diabetes.         Relevant Medications   metFORMIN (GLUCOPHAGE) 500 MG tablet   Mood disorder (HCC), emotional eating    Other depression/emotional eating Victoria Garcia has had issues with stress/emotional eating. Currently this is moderately controlled. Overall mood is stable. Medication(s): Bupropion XL 300 mg daily Takes Wellbutrin SR 150 mg 2 tablets in the morning and would like to continue this way.   Plan: Continue and refill Bupropion XL 300 mg daily Continue to work on emotional eating strategies.           Relevant  Medications   buPROPion (WELLBUTRIN SR) 150 MG 12 hr tablet   Generalized obesity   Relevant Medications   metFORMIN (GLUCOPHAGE) 500 MG tablet   Environmental and seasonal allergies    Seasonal allergies continues to have symptoms.  On xyzal without side effects.  Plan: Refill/Continue xyzal 5 mg daily at bedtime.       Relevant Medications   levocetirizine (XYZAL) 5 MG tablet   Current BMI 32.8  TREATMENT PLAN FOR OBESITY:  Recommended Dietary Goals  Victoria Garcia is currently in the action stage of change. As such, her goal is  to continue weight management plan. She has agreed to the Category 2 Plan.  Behavioral Intervention  We discussed the following Behavioral Modification Strategies today: increasing lean protein intake, decreasing simple carbohydrates , increasing vegetables, increasing fiber rich foods, increasing water intake, and planning for success.  Additional resources provided today: NA  Recommended Physical Activity Goals  Victoria Garcia has been advised to work up to 150 minutes of moderate intensity aerobic activity a week and strengthening exercises 2-3 times per week for cardiovascular health, weight loss maintenance and preservation of muscle mass.   She has agreed to Continue current level of physical activity    Pharmacotherapy We discussed various medication options to help Victoria Garcia with her weight loss efforts and we both agreed to continue metformin for prediabetes and nutritional and behavioral strategies to promote weight loss.    Return in about 4 weeks (around 09/27/2022).Marland Kitchen She was informed of the importance of frequent follow up visits to maximize her success with intensive lifestyle modifications for her multiple health conditions.   ATTESTASTION STATEMENTS:  Reviewed by clinician on day of visit: allergies, medications, problem list, medical history, surgical history, family history, social history, and previous encounter notes.   I have personally spent  30 minutes total time today in preparation, patient care, nutritional counseling and documentation for this visit, including the following: review of clinical lab tests; review of medical tests/procedures/services.      Edell Mesenbrink, PA-C

## 2022-08-30 NOTE — Assessment & Plan Note (Signed)
Seasonal allergies continues to have symptoms.  On xyzal without side effects.  Plan: Refill/Continue xyzal 5 mg daily at bedtime.

## 2022-08-30 NOTE — Assessment & Plan Note (Signed)
Vitamin D Deficiency Vitamin D is at goal of 50.  Most recent vitamin D level was 55.7. She is on OTC vitamin D3 2000 IU daily. Lab Results  Component Value Date   VD25OH 55.7 06/13/2022   VD25OH 48.7 01/12/2022   VD25OH 54 08/17/2020    Plan: Continue OTC vitamin D3 2000 IU daily Low vitamin D levels can be associated with adiposity and may result in leptin resistance and weight gain. Also associated with fatigue. Currently on vitamin D supplementation without any adverse effects.

## 2022-08-30 NOTE — Assessment & Plan Note (Signed)
Other depression/emotional eating Victoria Garcia has had issues with stress/emotional eating. Currently this is moderately controlled. Overall mood is stable. Medication(s): Bupropion XL 300 mg daily Takes Wellbutrin SR 150 mg 2 tablets in the morning and would like to continue this way.   Plan: Continue and refill Bupropion XL 300 mg daily Continue to work on emotional eating strategies.

## 2022-09-05 ENCOUNTER — Other Ambulatory Visit (HOSPITAL_COMMUNITY): Payer: Self-pay

## 2022-09-09 ENCOUNTER — Other Ambulatory Visit (HOSPITAL_COMMUNITY): Payer: Self-pay

## 2022-09-21 ENCOUNTER — Other Ambulatory Visit (HOSPITAL_COMMUNITY): Payer: Self-pay

## 2022-09-26 ENCOUNTER — Ambulatory Visit (INDEPENDENT_AMBULATORY_CARE_PROVIDER_SITE_OTHER): Payer: Commercial Managed Care - PPO | Admitting: Physician Assistant

## 2022-09-26 ENCOUNTER — Encounter (INDEPENDENT_AMBULATORY_CARE_PROVIDER_SITE_OTHER): Payer: Self-pay | Admitting: Physician Assistant

## 2022-09-26 ENCOUNTER — Other Ambulatory Visit (HOSPITAL_COMMUNITY): Payer: Self-pay

## 2022-09-26 VITALS — BP 113/76 | HR 79 | Temp 98.0°F | Ht 65.0 in | Wt 198.0 lb

## 2022-09-26 DIAGNOSIS — Z6832 Body mass index (BMI) 32.0-32.9, adult: Secondary | ICD-10-CM

## 2022-09-26 DIAGNOSIS — E669 Obesity, unspecified: Secondary | ICD-10-CM | POA: Diagnosis not present

## 2022-09-26 DIAGNOSIS — F39 Unspecified mood [affective] disorder: Secondary | ICD-10-CM

## 2022-09-26 DIAGNOSIS — R7303 Prediabetes: Secondary | ICD-10-CM | POA: Diagnosis not present

## 2022-09-26 DIAGNOSIS — J3089 Other allergic rhinitis: Secondary | ICD-10-CM

## 2022-09-26 MED ORDER — METFORMIN HCL 500 MG PO TABS
500.0000 mg | ORAL_TABLET | Freq: Every day | ORAL | 0 refills | Status: DC
Start: 2022-09-26 — End: 2022-10-31
  Filled 2022-09-26: qty 30, 30d supply, fill #0

## 2022-09-26 MED ORDER — LEVOCETIRIZINE DIHYDROCHLORIDE 5 MG PO TABS
5.0000 mg | ORAL_TABLET | Freq: Every evening | ORAL | 0 refills | Status: DC
Start: 2022-09-26 — End: 2023-03-06
  Filled 2022-09-26: qty 60, 60d supply, fill #0

## 2022-09-26 MED ORDER — BUPROPION HCL ER (SR) 150 MG PO TB12
300.0000 mg | ORAL_TABLET | Freq: Every day | ORAL | 1 refills | Status: DC
Start: 2022-09-26 — End: 2022-10-31
  Filled 2022-09-26: qty 60, 30d supply, fill #0

## 2022-09-26 NOTE — Progress Notes (Unsigned)
Office: [phone]  /  Fax: [phone]  WEIGHT SUMMARY AND BIOMETRICS  Vitals Temp: 98 F (36.7 C) BP: 113/76 Pulse Rate: 79 SpO2: 100 %   Anthropometric Measurements Height: 5\' 5"  (1.651 m) Weight: 198 lb (89.8 kg) BMI (Calculated): 32.95 Weight at Last Visit: 197 lb Weight Lost Since Last Visit: 0 lb Weight Gained Since Last Visit: 1 lb Starting Weight: 200 lb Total Weight Loss (lbs): 7 lb (3.175 kg)   Body Composition  Body Fat %: 39.3 % Fat Mass (lbs): 77.8 lbs Muscle Mass (lbs): 114.2 lbs Total Body Water (lbs): 78.8 lbs Visceral Fat Rating : 11   Other Clinical Data Labs: no Today's Visit #: 27 Starting Date: 10/27/20     HPI  Chief Complaint: OBESITY  Victoria Garcia is here to discuss her progress with her obesity treatment plan. She is on the the Category 1 Plan and states she is following her eating plan approximately 50 % of the time. She states she is exercising 45 minutes 3 times per week.   Interval History:  Since last office visit she up 1 lb.  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 We discussed getting in to see her PCP and GYN for follow up.   Pharmacotherapy: Metformin- changed to morning dosing and this seems to help control hunger during day better. No GI or other side effects with metformin.  On Ozempic/Wegovy and then Saxenda in 2023 but no insurance coverage since Jan. 2024,and off all GLP-1 medications.   PHYSICAL EXAM:  Blood pressure 113/76, pulse 79, temperature 98 F (36.7 C), height 5\' 5"  (1.651 m), weight 198 lb (89.8 kg), SpO2 100 %. Body mass index is 32.95 kg/m.  General: She is overweight,  cooperative, alert, well developed, and in no acute distress. PSYCH: Has normal mood, affect and thought process.   Cardiovascular: HR 70's regular Lungs: Normal breathing effort, no conversational dyspnea. Neuro: no focal deficits  DIAGNOSTIC DATA REVIEWED:  BMET    Component Value Date/Time   NA 143 06/13/2022 0808   K 4.4 06/13/2022 0808   CL 104 06/13/2022 0808   CO2 27 06/13/2022 0808   GLUCOSE 90 06/13/2022 0808   GLUCOSE 98 02/17/2021 0825   BUN 18 06/13/2022 0808   CREATININE 0.75 06/13/2022 0808   CREATININE 0.73 02/17/2021 0825   CALCIUM 9.4 06/13/2022 0808   GFRNONAA 96 08/17/2020 1000   GFRAA 111 08/17/2020 1000   Lab Results  Component Value Date   HGBA1C 6.0 (H) 06/13/2022   HGBA1C 6.5 (H) 05/07/2014   Lab Results  Component Value Date   INSULIN 13.8 06/13/2022   INSULIN 14.3 10/27/2020   Lab Results  Component Value Date   TSH 1.380 06/13/2022   CBC    Component Value Date/Time   WBC 6.1 06/13/2022 0808   WBC 7.0 08/17/2020 1000   RBC 4.31 06/13/2022 0808   RBC 4.48 08/17/2020 1000   HGB 13.3 06/13/2022 0808   HCT 39.5 06/13/2022 0808   PLT 216 06/13/2022 0808   MCV 92 06/13/2022 0808   MCH 30.9 06/13/2022 0808   MCH 30.1 08/17/2020 1000   MCHC 33.7 06/13/2022 0808   MCHC 33.3 08/17/2020 1000   RDW 12.2  06/13/2022 0808   Iron Studies    Component Value Date/Time   IRON 50 09/24/2014 1018   Lipid Panel     Component Value Date/Time   CHOL 144 06/13/2022 0808   TRIG 71 06/13/2022 0808   HDL 49 06/13/2022 0808   CHOLHDL 4.2 01/12/2022 1552   CHOLHDL 4.4 02/17/2021 0825   VLDL 21 01/13/2017 0924   LDLCALC 81 06/13/2022 0808   LDLCALC 105 (H) 02/17/2021 0825   LDLDIRECT 85 06/17/2021 1026   Hepatic Function Panel     Component Value Date/Time   PROT 7.1 06/13/2022 0808   ALBUMIN 4.1 06/13/2022 0808   AST 20 06/13/2022 0808   ALT 30 06/13/2022 0808   ALKPHOS 94 06/13/2022 0808   BILITOT 0.7 06/13/2022 0808      Component  Value Date/Time   TSH 1.380 06/13/2022 0808   Nutritional Lab Results  Component Value Date   VD25OH 55.7 06/13/2022   VD25OH 48.7 01/12/2022   VD25OH 54 08/17/2020    ASSOCIATED CONDITIONS ADDRESSED TODAY  ASSESSMENT AND PLAN  Problem List Items Addressed This Visit     Prediabetes - Primary   Relevant Medications   metFORMIN (GLUCOPHAGE) 500 MG tablet   Mood disorder (HCC), emotional eating   Relevant Medications   buPROPion (WELLBUTRIN SR) 150 MG 12 hr tablet   Generalized obesity   Relevant Medications   metFORMIN (GLUCOPHAGE) 500 MG tablet   Environmental and seasonal allergies   Relevant Medications   levocetirizine (XYZAL) 5 MG tablet   BMI 32.0-32.9,adult Current BMI 32.6    Prediabetes Last A1c was 6.0  Medication(s): Metformin 500 mg once daily breakfast No side effects with metformin.  She is working on Engineer, technical sales to decrease simple carbohydrates, increase lean proteins and exercise to promote weight loss, improve glycemic control and prevent progression to Type 2 diabetes.   Lab Results  Component Value Date   HGBA1C 6.0 (H) 06/13/2022   HGBA1C 6.1 (H) 01/12/2022   HGBA1C 6.0 (H) 06/17/2021   HGBA1C 6.0 (H) 01/04/2021   HGBA1C 5.9 (H) 08/17/2020   Lab Results  Component Value Date   INSULIN 13.8 06/13/2022   INSULIN 14.3 10/27/2020    Plan: Continue and refill Metformin 500 mg once daily breakfast Continue working on nutrition plan to decrease simple carbohydrates, increase lean proteins and exercise to promote weight loss, improve glycemic control and prevent progression to Type 2 diabetes.    Mood disorder/emotional eating Victoria Garcia has had issues with stress/emotional eating. Currently this is moderately controlled. Overall mood is stable. Medication(s): Bupropion XL 300 mg daily  Plan: Continue and refill Bupropion XL 300 mg daily Continue to work on emotional eating strategies.     Seasonal Allergies:  Reports seasonal allergies.  On xyzal without side effects and good results.  Plan; Refill and continue Xyzal and monitor response.   TREATMENT PLAN FOR OBESITY:  Recommended Dietary Goals  Breasha is currently in the action stage of change. As such, her goal is to continue weight management plan. She has agreed to the Category 2 Plan.  Behavioral Intervention  We discussed the following Behavioral Modification Strategies today: increasing lean protein intake, decreasing simple carbohydrates , increasing vegetables, increasing lower glycemic fruits, increasing water intake, continue to practice mindfulness when eating, and planning for success.  Additional resources provided today: NA  Recommended Physical Activity Goals  Julio has been advised to work up to 150 minutes of moderate intensity aerobic activity a week and strengthening exercises 2-3 times per  week for cardiovascular health, weight loss maintenance and preservation of muscle mass.   She has agreed to Continue current level of physical activity    Pharmacotherapy We discussed various medication options to help Samyra with her weight loss efforts and we both agreed to continue metformin for prediabetes and Wellbutrin for emotional eating and continue to work on nutritional and behavioral strategies to promote weight loss.      Return in about 4 weeks (around 10/24/2022).Marland Kitchen She was informed of the importance of frequent follow up visits to maximize her success with intensive lifestyle modifications for her multiple health conditions.   ATTESTASTION STATEMENTS:  Reviewed by clinician on day of visit: allergies, medications, problem list, medical history, surgical history, family history, social history, and previous encounter notes.   I have personally spent 40 minutes total time today in preparation, patient care, nutritional counseling and documentation for this visit, including the following: review of clinical lab tests; review of medical  tests/procedures/services.      Sweetie Giebler, PA-C

## 2022-09-30 ENCOUNTER — Other Ambulatory Visit (HOSPITAL_COMMUNITY): Payer: Self-pay

## 2022-10-04 ENCOUNTER — Encounter: Payer: Self-pay | Admitting: Student

## 2022-10-04 ENCOUNTER — Ambulatory Visit: Payer: Commercial Managed Care - PPO | Admitting: Student

## 2022-10-04 ENCOUNTER — Other Ambulatory Visit: Payer: Self-pay

## 2022-10-04 VITALS — BP 115/75 | HR 82 | Temp 98.0°F | Ht 65.0 in | Wt 204.6 lb

## 2022-10-04 DIAGNOSIS — Z7689 Persons encountering health services in other specified circumstances: Secondary | ICD-10-CM

## 2022-10-04 DIAGNOSIS — E669 Obesity, unspecified: Secondary | ICD-10-CM | POA: Diagnosis not present

## 2022-10-04 DIAGNOSIS — R7303 Prediabetes: Secondary | ICD-10-CM | POA: Diagnosis not present

## 2022-10-04 DIAGNOSIS — Z6830 Body mass index (BMI) 30.0-30.9, adult: Secondary | ICD-10-CM

## 2022-10-04 LAB — POCT GLYCOSYLATED HEMOGLOBIN (HGB A1C): Hemoglobin A1C: 6.4 % — AB (ref 4.0–5.6)

## 2022-10-04 LAB — GLUCOSE, CAPILLARY: Glucose-Capillary: 101 mg/dL — ABNORMAL HIGH (ref 70–99)

## 2022-10-04 NOTE — Patient Instructions (Signed)
Thank you, [name] for allowing [name] to provide your care today. Welcome to the [facility]!  -We will recheck your A1c today. Continue your metformin and following with [facility].  -Thank you for choosing [name] as your primary care team.   I have ordered the following labs for you:  Lab Orders         POC Hbg A1C       Follow up: 6 months   Should you have any questions or concerns please call the internal medicine clinic at [phone].    Rana Snare, D.O. [facility]

## 2022-10-04 NOTE — Assessment & Plan Note (Signed)
A1c today was 6.4% from 6% in January. She was started on metformin 500 mg daily about 2 months ago. Tolerating well. She follows with [facility]. Was on GLP-1 for obesity but insurance no longer covers it.   Plan -continue f/u with [facility] -continue metformin, discuss regarding titrate up

## 2022-10-04 NOTE — Assessment & Plan Note (Signed)
Lipid panel in January showed LDL improved to 81 on atorvastatin 40 mg daily. She is working on her diet and following with [facility].   Plan -continue atorvastatin

## 2022-10-04 NOTE — Progress Notes (Unsigned)
CC: establish care  HPI:  [name] is a 62 y.o. female living with a history stated below and presents today for establish care. Please see problem based assessment and plan for additional details.  PMH: Past Medical History:  Diagnosis Date   Allergy    SEASONAL   Anemia, iron deficiency 09/24/2014   Back pain    Depression    History of stomach ulcers    Hyperlipidemia    Joint pain    Multiple food allergies    Prediabetes    Vitamin D deficiency    PSH: Past Surgical History:  Procedure Laterality Date   BREAST REDUCTION SURGERY  2004   bilat   BREAST SURGERY  2007   BREAST REDUCTION   CESAREAN SECTION  2002   COLONOSCOPY     DILITATION & CURRETTAGE/HYSTROSCOPY WITH HYDROTHERMAL ABLATION N/A 04/08/2016   Procedure: HYSTEROSCOPY , HYDROTHERMAL ABLATION;  Surgeon: Jaymes Graff, MD;  Location: [facility];  Service: Gynecology;  Laterality: N/A;   ENDOMETRIAL ABLATION     TONSILLECTOMY     TUBAL LIGATION     VAGINAL DELIVERY  1999   Meds: Current Outpatient Medications on File Prior to Visit  Medication Sig Dispense Refill   atorvastatin (LIPITOR) 40 MG tablet Take 1 tablet (40 mg total) by mouth at bedtime. 90 tablet 3   b complex vitamins capsule Take 1 capsule by mouth daily.     buPROPion (WELLBUTRIN SR) 150 MG 12 hr tablet Take 2 tablets (300 mg total) by mouth daily. 60 tablet 1   Cholecalciferol (VITAMIN D) 2000 units tablet Take 1 tablet (2,000 Units total) by mouth daily.     levocetirizine (XYZAL) 5 MG tablet Take 1 tablet (5 mg total) by mouth every evening. 60 tablet 0   metFORMIN (GLUCOPHAGE) 500 MG tablet Take 1 tablet (500 mg total) by mouth daily. 30 tablet 0   Multiple Vitamin (MULTIVITAMIN WITH MINERALS) TABS tablet Take 1 tablet by mouth daily.     No current facility-administered medications on file prior to visit.   FH:  Family History  Problem Relation Age of Onset   Cancer Mother 62       Squamous cell Primary unknown    Hypertension Mother    Hyperlipidemia Mother    Thyroid disease Mother    Cancer Father        Prostate cancer   Diabetes Father    Thyroid disease Maternal Aunt    Cancer Maternal Aunt        All mother siblings died from cancer   Thyroid disease Maternal Uncle    Cancer Maternal Uncle    Thyroid disease Maternal Grandmother    Cancer Maternal Grandmother        STOMACH CANCER   Cancer Maternal Grandfather        Prostate cancer   SH: -lives in [place] with husband and 2 kids -works in [employer] here at [employer]  -denies tobacco and illicit drug use, social/occasional alcohol use Social History   Socioeconomic History   Marital status: Married    Spouse name: Sakiya Hulm   Number of children: 2   Years of education: Not on file   Highest education level: Not on file  Occupational History   Occupation: Cardiovascular Specialist  Tobacco Use   Smoking status: Never   Smokeless tobacco: Never  Vaping Use   Vaping Use: Never used  Substance and Sexual Activity   Alcohol use: Yes    Comment:  few drinks per month   Drug use: No   Sexual activity: Yes    Birth control/protection: Surgical  Other Topics Concern   Not on file  Social History Narrative   Married, has 2 children, boy and girl.  Works at cardiac cath lab, cardiovascular specialist, degree in radiology.  Exercise - walks at least a mile daily.    12/2021.   Social Determinants of Health   Financial Resource Strain: Not on file  Food Insecurity: Not on file  Transportation Needs: Not on file  Physical Activity: Not on file  Stress: Not on file  Social Connections: Not on file  Intimate Partner Violence: Not on file    Review of Systems: ROS negative except for what is noted on the assessment and plan.  Vitals:   10/04/22 1101  BP: 115/75  Pulse: 82  Temp: 98 F (36.7 C)  TempSrc: Oral  SpO2: 100%  Weight: 204 lb 9.6 oz (92.8 kg)  Height: 5\' 5"  (1.651 m)   Physical Exam: Constitutional:  well-appearing female sitting in chair comfortably, in no acute distress HENT: normocephalic atraumatic Cardiovascular: regular rate and rhythm Pulmonary/Chest: normal work of breathing on room air, lungs clear to auscultation bilaterally MSK: normal bulk and tone Neurological: alert & oriented x 3 Skin: warm and dry Psych: pleasant mood  Assessment & Plan:   Prediabetes A1c today was 6.4% from 6% in January. She was started on metformin 500 mg daily about 2 months ago. Tolerating well. She follows with [facility]. Was on GLP-1 for obesity but insurance no longer covers it.   Plan -continue f/u with [facility] -continue metformin, discuss regarding titrate up   Hyperlipidemia Lipid panel in January showed LDL improved to 81 on atorvastatin 40 mg daily. She is working on her diet and following with [facility].   Plan -continue atorvastatin  Obesity (BMI 30.0-34.9) BMI at today's visit 34.05. Follows with [facility]. She was started on metformin 2 months ago. She is also on Wellbutrin for emotional eating/cravings. States it has helped. Was on GLP-1 but lost insurance coverage this year. She continues to work on healthy eating habits.     Patient discussed with Dr. Rollene Fare, D.O. [facility], PGY-1 Phone: [phone] Date 10/04/2022 Time 1:23 PM

## 2022-10-05 DIAGNOSIS — E669 Obesity, unspecified: Secondary | ICD-10-CM | POA: Insufficient documentation

## 2022-10-05 DIAGNOSIS — Z7689 Persons encountering health services in other specified circumstances: Secondary | ICD-10-CM | POA: Insufficient documentation

## 2022-10-05 NOTE — Assessment & Plan Note (Signed)
BMI at today's visit 34.05. Follows with [facility]. She was started on metformin 2 months ago. She is also on Wellbutrin for emotional eating/cravings. States it has helped. Was on GLP-1 but lost insurance coverage this year. She continues to work on healthy eating habits.

## 2022-10-07 NOTE — Progress Notes (Signed)
Internal Medicine Clinic Attending  Case discussed with Dr. Zheng  At the time of the visit.  We reviewed the resident's history and exam and pertinent patient test results.  I agree with the assessment, diagnosis, and plan of care documented in the resident's note.  

## 2022-10-10 ENCOUNTER — Encounter: Payer: Self-pay | Admitting: Student

## 2022-10-28 ENCOUNTER — Other Ambulatory Visit (HOSPITAL_COMMUNITY): Payer: Self-pay

## 2022-10-31 ENCOUNTER — Ambulatory Visit (INDEPENDENT_AMBULATORY_CARE_PROVIDER_SITE_OTHER): Payer: Commercial Managed Care - PPO | Admitting: Physician Assistant

## 2022-10-31 ENCOUNTER — Other Ambulatory Visit (HOSPITAL_COMMUNITY): Payer: Self-pay

## 2022-10-31 ENCOUNTER — Encounter (INDEPENDENT_AMBULATORY_CARE_PROVIDER_SITE_OTHER): Payer: Self-pay | Admitting: Physician Assistant

## 2022-10-31 VITALS — BP 115/76 | HR 76 | Temp 98.0°F | Ht 65.0 in | Wt 201.0 lb

## 2022-10-31 DIAGNOSIS — Z6833 Body mass index (BMI) 33.0-33.9, adult: Secondary | ICD-10-CM | POA: Diagnosis not present

## 2022-10-31 DIAGNOSIS — E669 Obesity, unspecified: Secondary | ICD-10-CM | POA: Diagnosis not present

## 2022-10-31 DIAGNOSIS — R7303 Prediabetes: Secondary | ICD-10-CM

## 2022-10-31 DIAGNOSIS — F39 Unspecified mood [affective] disorder: Secondary | ICD-10-CM

## 2022-10-31 MED ORDER — BUPROPION HCL ER (SR) 150 MG PO TB12
150.0000 mg | ORAL_TABLET | Freq: Every day | ORAL | 1 refills | Status: DC
Start: 2022-10-31 — End: 2023-01-09
  Filled 2022-10-31: qty 30, 30d supply, fill #0

## 2022-10-31 MED ORDER — METFORMIN HCL 500 MG PO TABS
500.0000 mg | ORAL_TABLET | Freq: Every day | ORAL | 0 refills | Status: DC
Start: 2022-10-31 — End: 2023-01-09
  Filled 2022-10-31: qty 30, 30d supply, fill #0

## 2022-10-31 NOTE — Progress Notes (Signed)
.smr  Office: [phone]  /  Fax: [phone]  WEIGHT SUMMARY AND BIOMETRICS  Vitals Temp: 98 F (36.7 C) BP: 115/76 Pulse Rate: 76 SpO2: 99 %   Anthropometric Measurements Height: 5\' 5"  (1.651 m) Weight: 201 lb (91.2 kg) BMI (Calculated): 33.45 Weight at Last Visit: 198 lb Weight Lost Since Last Visit: 0 Weight Gained Since Last Visit: 3 lb Starting Weight: 200 lb   Body Composition  Body Fat %: 38.8 % Fat Mass (lbs): 78.2 lbs Muscle Mass (lbs): 117 lbs Total Body Water (lbs): 80.2 lbs Visceral Fat Rating : 11   Other Clinical Data Fasting: no Labs: no Today's Visit #: 28 Starting Date: 10/27/20     HPI  Chief Complaint: OBESITY  Victoria Garcia is here to discuss her progress with her obesity treatment plan. She is on the the Category 2 Plan and states she is following her eating plan approximately 50 % of the time. She states she is exercising/walking 45 minutes 3 times per week.   Interval History:  Since last office visit she is up 3 pounds Hunger/appetite-moderate control Cravings-increased lately with increased work stress.  She is indulging more on the weekends and is drinking some [employer] Frappuccino when she has had more stressful work days. Stress-very high at work.  She did take some time off and has felt that this has been helpful. Sleep-not restorative.  She wakes up frequently due to night sweats.  We discussed seeing a GYN for further evaluation. Exercise-she is trying to exercise, walking up to 45 minutes 3 times per week.  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  We discussed trying to look at getting more prebiotic fiber, at least 3 servings of smart fruit or vegetable options.  We did discuss some [place] yogurt ranch dip with vegetables as a possible option to try to increase fiber.   Pharmacotherapy: She is on metformin 500 mg once daily.  No GI side effects with metformin.   Recent A1c was up.  She recently established care with a new PCP-Dr. Rana Snare . will plan to get follow-up A1c and insulin levels fasting at next visit. She is on was Ozempic/Wegovy and then [name] in 2023-but no insurance coverage for this since January 2024, she is off of all GLP-1 medications since that time. Bupropion for cravings/emotional eating - no side effects, but she is not sure that it is helping anymore. Reports difficulty sleeping as noted.   TREATMENT PLAN FOR OBESITY:  Recommended Dietary Goals  Gearlene is currently in the action stage of change. As such, her goal is to continue weight management plan. She has agreed to the Category 1 Plan.  Behavioral Intervention  We discussed the following Behavioral Modification Strategies today: increasing lean protein intake, decreasing simple carbohydrates , increasing vegetables, increasing lower glycemic fruits, increasing fiber rich foods, increasing water intake, identifying sources and decreasing liquid calories, emotional eating strategies and understanding the difference between hunger signals and cravings, work on managing stress, creating time for self-care and relaxation measures, continue to practice mindfulness when eating, and planning for success.  Additional resources provided today: NA  Recommended Physical Activity Goals  Paizlee has been advised to work up to 150 minutes of moderate intensity aerobic activity a week and strengthening exercises 2-3 times per week for cardiovascular health, weight loss maintenance and preservation of muscle mass.   She has agreed to Continue current level of physical activity  and Think about ways to increase daily physical activity and overcoming barriers to exercise   Pharmacotherapy We discussed various medication options to help Nimisha with her weight loss efforts and we both agreed to continue metformin 500 mg once daily for primary indication of prediabetes and we are going  to decrease Wellbutrin to 150 mg once a day for cravings..    Return in about 4 weeks (around 11/28/2022) for Fasting Lab.Marland Kitchen She was informed of the importance of frequent follow up visits to maximize her success with intensive lifestyle modifications for her multiple health conditions.  PHYSICAL EXAM:  Blood pressure 115/76, pulse 76, temperature 98 F (36.7 C), height 5\' 5"  (1.651 m), weight 201 lb (91.2 kg), SpO2 99 %. Body mass index is 33.45 kg/m.  General: She is overweight, cooperative, alert, well developed, and in no acute distress. PSYCH: Has normal mood, affect and thought process.   Cardiovascular:  HR 70's regular.  Lungs: Normal breathing effort, no conversational dyspnea. Neuro: No gross deficits  DIAGNOSTIC DATA REVIEWED:  BMET    Component Value Date/Time   NA 143 06/13/2022 0808   K 4.4 06/13/2022 0808   CL 104 06/13/2022 0808   CO2 27 06/13/2022 0808   GLUCOSE 90 06/13/2022 0808   GLUCOSE 98 02/17/2021 0825   BUN 18 06/13/2022 0808   CREATININE 0.75 06/13/2022 0808   CREATININE 0.73 02/17/2021 0825   CALCIUM 9.4 06/13/2022 0808   GFRNONAA 96 08/17/2020 1000   GFRAA 111 08/17/2020 1000   Lab Results  Component Value Date   HGBA1C 6.4 (A) 10/04/2022   HGBA1C 6.5 (H) 05/07/2014   Lab Results  Component Value Date   INSULIN 13.8 06/13/2022   INSULIN 14.3 10/27/2020   Lab Results  Component Value Date   TSH 1.380 06/13/2022   CBC    Component Value Date/Time   WBC 6.1 06/13/2022 0808   WBC 7.0 08/17/2020 1000   RBC 4.31 06/13/2022 0808   RBC 4.48 08/17/2020 1000   HGB 13.3 06/13/2022 0808   HCT 39.5 06/13/2022 0808   PLT 216 06/13/2022 0808   MCV 92 06/13/2022 0808   MCH 30.9 06/13/2022 0808   MCH 30.1 08/17/2020 1000   MCHC 33.7 06/13/2022 0808   MCHC 33.3 08/17/2020 1000   RDW 12.2 06/13/2022 0808   Iron Studies    Component Value Date/Time   IRON 50 09/24/2014 1018   Lipid Panel     Component Value Date/Time   CHOL 144  06/13/2022 0808   TRIG 71 06/13/2022 0808   HDL 49 06/13/2022 0808   CHOLHDL 4.2 01/12/2022 1552   CHOLHDL 4.4 02/17/2021 0825   VLDL 21 01/13/2017 0924   LDLCALC 81 06/13/2022 0808   LDLCALC 105 (H) 02/17/2021 0825   LDLDIRECT 85 06/17/2021 1026   Hepatic Function Panel     Component Value Date/Time   PROT 7.1 06/13/2022 0808   ALBUMIN 4.1 06/13/2022 0808   AST 20 06/13/2022 0808   ALT 30 06/13/2022 0808   ALKPHOS 94 06/13/2022 0808   BILITOT 0.7 06/13/2022 0808      Component Value Date/Time   TSH 1.380 06/13/2022 0808   Nutritional Lab Results  Component Value Date   VD25OH 55.7 06/13/2022   VD25OH 48.7 01/12/2022   VD25OH 54 08/17/2020    ASSOCIATED CONDITIONS ADDRESSED TODAY  ASSESSMENT AND PLAN  Problem List Items Addressed This Visit     Prediabetes - Primary   Relevant Medications   metFORMIN (GLUCOPHAGE) 500 MG tablet  Mood disorder (HCC), emotional eating   Relevant Medications   buPROPion (WELLBUTRIN SR) 150 MG 12 hr tablet   Generalized obesity   Relevant Medications   metFORMIN (GLUCOPHAGE) 500 MG tablet   BMI 33.0-33.9,adult   Prediabetes Last A1c was 6.4  Medication(s): Metformin 500 mg once daily breakfast Polyphagia:No Lab Results  Component Value Date   HGBA1C 6.4 (A) 10/04/2022   HGBA1C 6.0 (H) 06/13/2022   HGBA1C 6.1 (H) 01/12/2022   HGBA1C 6.0 (H) 06/17/2021   HGBA1C 6.0 (H) 01/04/2021   Lab Results  Component Value Date   INSULIN 13.8 06/13/2022   INSULIN 14.3 10/27/2020    Plan: Continue and refill Metformin 500 mg once daily breakfast Will plan to recheck fasting A1c and insulin levels next visit.  Continue working on nutrition plan to decrease simple carbohydrates, increase lean proteins and exercise to promote weight loss, improve glycemic control and prevent progression to Type 2 diabetes.   Mood disorder/emotional eating Maybell has had issues with stress/emotional eating. Currently this is moderately controlled.  Overall mood is stable. Medication(s): Bupropion XL 300 mg daily No side effects with bupropion, but she is not sure it is helping. She does reports difficulty with sleeping, mainly due to night sweats, but concerned bupropion may not be helping as well. Discussed decreasing dose and monitor closely for response.   Plan: Continue and decrease dose Bupropion SR 150 mg daily in am Monitor response on decreased bupropion. May want to consider other medications at next visit.  Continue strategies for emotional eating behaviors/cravings.   ATTESTASTION STATEMENTS:  Reviewed by clinician on day of visit: allergies, medications, problem list, medical history, surgical history, family history, social history, and previous encounter notes.   I have personally spent 42 minutes total time today in preparation, patient care, nutritional counseling and documentation for this visit, including the following: review of clinical lab tests; review of medical tests/procedures/services.      Elisabeth Strom, PA-C

## 2022-11-14 ENCOUNTER — Other Ambulatory Visit (HOSPITAL_COMMUNITY): Payer: Self-pay

## 2022-11-26 ENCOUNTER — Encounter: Payer: Self-pay | Admitting: Student

## 2022-11-26 DIAGNOSIS — E7849 Other hyperlipidemia: Secondary | ICD-10-CM

## 2022-11-28 ENCOUNTER — Other Ambulatory Visit (HOSPITAL_COMMUNITY): Payer: Self-pay

## 2022-11-28 MED ORDER — ATORVASTATIN CALCIUM 40 MG PO TABS
40.0000 mg | ORAL_TABLET | Freq: Every day | ORAL | 3 refills | Status: DC
Start: 2022-11-28 — End: 2023-02-02
  Filled 2022-11-28 – 2022-11-29 (×2): qty 90, 90d supply, fill #0

## 2022-11-29 ENCOUNTER — Other Ambulatory Visit (HOSPITAL_COMMUNITY): Payer: Self-pay

## 2022-12-05 ENCOUNTER — Ambulatory Visit (INDEPENDENT_AMBULATORY_CARE_PROVIDER_SITE_OTHER): Payer: Commercial Managed Care - PPO | Admitting: Physician Assistant

## 2022-12-05 DIAGNOSIS — F39 Unspecified mood [affective] disorder: Secondary | ICD-10-CM

## 2022-12-05 DIAGNOSIS — E669 Obesity, unspecified: Secondary | ICD-10-CM

## 2022-12-05 DIAGNOSIS — E559 Vitamin D deficiency, unspecified: Secondary | ICD-10-CM

## 2022-12-05 DIAGNOSIS — R7303 Prediabetes: Secondary | ICD-10-CM

## 2022-12-05 DIAGNOSIS — R5383 Other fatigue: Secondary | ICD-10-CM

## 2022-12-05 DIAGNOSIS — E7849 Other hyperlipidemia: Secondary | ICD-10-CM

## 2022-12-17 ENCOUNTER — Encounter: Payer: Self-pay | Admitting: Student

## 2022-12-29 DIAGNOSIS — Z1231 Encounter for screening mammogram for malignant neoplasm of breast: Secondary | ICD-10-CM | POA: Diagnosis not present

## 2023-01-05 NOTE — Progress Notes (Signed)
.smr  Office: [phone]  /  Fax: [phone]  WEIGHT SUMMARY AND BIOMETRICS  Vitals Temp: 98.1 F (36.7 C) BP: 127/82 Pulse Rate: 74 SpO2: 100 %   Anthropometric Measurements Height: 5\' 5"  (1.651 m) Weight: 210 lb (95.3 kg) BMI (Calculated): 34.95 Weight at Last Visit: 201lb Weight Lost Since Last Visit: 0lb Weight Gained Since Last Visit: 9lb Starting Weight: 200lb Total Weight Loss (lbs): 0 lb (0 kg)   Body Composition  Body Fat %: 39.5 % Fat Mass (lbs): 83 lbs Muscle Mass (lbs): 120.6 lbs Total Body Water (lbs): 85.2 lbs Visceral Fat Rating : 11   Other Clinical Data Fasting: Yes Labs: Yes Today's Visit #: 29 Starting Date: 10/27/20     HPI  Chief Complaint: OBESITY  Victoria Garcia is here to discuss her progress with her obesity treatment plan. She is on the the Category 2 Plan and states she is following her eating plan approximately 40 % of the time. She states she is exercising walking 45  minutes 3 times per week.   Interval History:  Since last office visit she is up 9 pounds since her last visit in June. She got off track with her nutrition plan this summer, but is working to get back on track and has been much more mindful over the past 3 weeks. Hunger/appetite-she has noticed some increased hunger and appetite but this has improved since she has been back on plan.   Cravings-she denies excessive cravings since she has been back on plan Victoria Garcia is had some increased work stress with frequent scheduled changes and not always getting adequate hours lately.  She works in the [employer] at [employer] hopes to possibly be able to get back into the gym now that her hours may be changing Fasting labs obtained this visit She was informed we would discuss her lab results at her next visit unless there is a critical issue that needs to be addressed sooner. She agreed to keep her next visit at the agreed upon time to discuss these results.    Pharmacotherapy: Previously on metformin 500 mg once daily.  She has been off of medication for couple of months.  She had no GI side effects with metformin.  Recent A1c was up.  When she saw PCP-Dr. Rana Snare .  She is on was Ozempic/Wegovy and then [name] in 2023-but no insurance coverage for this since January 2024, she is off of all GLP-1 medications since that time. Previously on bupropion for cravings/emotional eating - no side effects-she was not totally sure that it was helping but would like to resume and we can monitor and see if it is having any positive effect.  TREATMENT PLAN FOR OBESITY:  Recommended Dietary Goals  [name] is currently in the action stage of change. As such, her goal is to continue weight management plan. She has agreed to the Category 2 Plan.  Behavioral Intervention  We discussed the following Behavioral Modification Strategies today: increasing lean protein intake, decreasing simple carbohydrates , increasing vegetables, increasing lower glycemic fruits, increasing fiber rich foods, avoiding skipping meals, increasing water intake, emotional eating strategies and understanding the difference between hunger signals and cravings, work on managing stress, creating time for self-care and relaxation measures, continue to practice mindfulness when eating, and planning for success.  Additional resources provided today: NA  Recommended Physical Activity Goals  [name] has been advised to work up to 150 minutes of moderate intensity aerobic activity a week and strengthening exercises 2-3  times per week for cardiovascular health, weight loss maintenance and preservation of muscle mass.   She has agreed to Continue current level of physical activity  and Think about ways to increase daily physical activity and overcoming barriers to exercise   Pharmacotherapy We discussed various medication options to help [name] with her weight loss efforts and we both agreed  to resume metformin and increase to 500 mg twice daily, resume bupropion 150 mg daily.    Return in about 4 weeks (around 02/06/2023).Marland Kitchen She was informed of the importance of frequent follow up visits to maximize her success with intensive lifestyle modifications for her multiple health conditions.  PHYSICAL EXAM:  Blood pressure 127/82, pulse 74, temperature 98.1 F (36.7 C), height 5\' 5"  (1.651 m), weight 210 lb (95.3 kg), SpO2 100%. Body mass index is 34.95 kg/m.  General: She is overweight, cooperative, alert, well developed, and in no acute distress. PSYCH: Has normal mood, affect and thought process.   Cardiovascular: HR 74, Regular. BP 127/82 Lungs: Normal breathing effort, no conversational dyspnea. Neuro: no focal deficits  DIAGNOSTIC DATA REVIEWED:  BMET    Component Value Date/Time   NA 143 06/13/2022 0808   K 4.4 06/13/2022 0808   CL 104 06/13/2022 0808   CO2 27 06/13/2022 0808   GLUCOSE 90 06/13/2022 0808   GLUCOSE 98 02/17/2021 0825   BUN 18 06/13/2022 0808   CREATININE 0.75 06/13/2022 0808   CREATININE 0.73 02/17/2021 0825   CALCIUM 9.4 06/13/2022 0808   GFRNONAA 96 08/17/2020 1000   GFRAA 111 08/17/2020 1000   Lab Results  Component Value Date   HGBA1C 6.4 (A) 10/04/2022   HGBA1C 6.5 (H) 05/07/2014   Lab Results  Component Value Date   INSULIN 13.8 06/13/2022   INSULIN 14.3 10/27/2020   Lab Results  Component Value Date   TSH 1.380 06/13/2022   CBC    Component Value Date/Time   WBC 6.1 06/13/2022 0808   WBC 7.0 08/17/2020 1000   RBC 4.31 06/13/2022 0808   RBC 4.48 08/17/2020 1000   HGB 13.3 06/13/2022 0808   HCT 39.5 06/13/2022 0808   PLT 216 06/13/2022 0808   MCV 92 06/13/2022 0808   MCH 30.9 06/13/2022 0808   MCH 30.1 08/17/2020 1000   MCHC 33.7 06/13/2022 0808   MCHC 33.3 08/17/2020 1000   RDW 12.2 06/13/2022 0808   Iron Studies    Component Value Date/Time   IRON 50 09/24/2014 1018   Lipid Panel     Component Value Date/Time    CHOL 144 06/13/2022 0808   TRIG 71 06/13/2022 0808   HDL 49 06/13/2022 0808   CHOLHDL 4.2 01/12/2022 1552   CHOLHDL 4.4 02/17/2021 0825   VLDL 21 01/13/2017 0924   LDLCALC 81 06/13/2022 0808   LDLCALC 105 (H) 02/17/2021 0825   LDLDIRECT 85 06/17/2021 1026   Hepatic Function Panel     Component Value Date/Time   PROT 7.1 06/13/2022 0808   ALBUMIN 4.1 06/13/2022 0808   AST 20 06/13/2022 0808   ALT 30 06/13/2022 0808   ALKPHOS 94 06/13/2022 0808   BILITOT 0.7 06/13/2022 0808      Component Value Date/Time   TSH 1.380 06/13/2022 0808   Nutritional Lab Results  Component Value Date   VD25OH 55.7 06/13/2022   VD25OH 48.7 01/12/2022   VD25OH 54 08/17/2020    ASSOCIATED CONDITIONS ADDRESSED TODAY  ASSESSMENT AND PLAN  Problem List Items Addressed This Visit     Vitamin D deficiency  Relevant Orders   VITAMIN D 25 Hydroxy (Vit-D Deficiency, Fractures)   Hyperlipidemia   Relevant Orders   Lipid Panel With LDL/HDL Ratio   Prediabetes - Primary   Relevant Medications   metFORMIN (GLUCOPHAGE) 500 MG tablet   Other Relevant Orders   Hemoglobin A1c   Insulin, random   CMP14+EGFR   CBC with Differential/Platelet   Mood disorder (HCC), emotional eating   Relevant Medications   buPROPion (WELLBUTRIN SR) 150 MG 12 hr tablet   Other Relevant Orders   Vitamin B12   TSH   Generalized obesity   Relevant Medications   metFORMIN (GLUCOPHAGE) 500 MG tablet   Prediabetes Last A1c was 6.4- worsening and not at goal. Insulin 13.8 not at goal.   Medication(s): Metformin 500 mg once daily breakfast She has not take over the past couple of months.  Polyphagia:No But notes increased hunger/appetite when off plan.  She is working on [employer] to decrease simple carbohydrates, increase lean proteins and exercise to promote weight loss, improve glycemic control and prevent progression to Type 2 diabetes.   Lab Results  Component Value Date   HGBA1C 6.4 (A) 10/04/2022    HGBA1C 6.0 (H) 06/13/2022   HGBA1C 6.1 (H) 01/12/2022   HGBA1C 6.0 (H) 06/17/2021   HGBA1C 6.0 (H) 01/04/2021   Lab Results  Component Value Date   INSULIN 13.8 06/13/2022   INSULIN 14.3 10/27/2020    Plan: Resume/Continue and increase dose Metformin 500 mg twice daily with meals Recheck fasting A1c and insulin levels today.  Continue working on nutrition plan to decrease simple carbohydrates, increase lean proteins and exercise to promote weight loss, improve glycemic control and prevent progression to Type 2 diabetes.    Hyperlipidemia LDL is at goal. HDL 49/ TRIG 71- at goals Medication(s): Lipitor 40 mg daily. No side effects.  Cardiovascular risk factors: dyslipidemia and obesity (BMI >= 30 kg/m2)  Lab Results  Component Value Date   CHOL 144 06/13/2022   HDL 49 06/13/2022   LDLCALC 81 06/13/2022   LDLDIRECT 85 06/17/2021   TRIG 71 06/13/2022   CHOLHDL 4.2 01/12/2022   CHOLHDL 3.6 06/17/2021   CHOLHDL 4.4 02/17/2021   Lab Results  Component Value Date   ALT 30 06/13/2022   AST 20 06/13/2022   ALKPHOS 94 06/13/2022   BILITOT 0.7 06/13/2022   The 10-year ASCVD risk score (Arnett DK, et al., 2019) is: 9.3%   Values used to calculate the score:     Age: 62 years     Sex: Female     Is Non-Hispanic African American: Yes     Diabetic: Yes     Tobacco smoker: No     Systolic Blood Pressure: 127 mmHg     Is BP treated: No     HDL Cholesterol: 49 mg/dL     Total Cholesterol: 144 mg/dL  Plan: Continue statin. Continue to work on nutrition plan -decreasing simple carbohydrates, increasing lean proteins, decreasing saturated fats and cholesterol , avoiding trans fats and exercise as able to promote weight loss, improve lipids and decrease cardiovascular risks. Recheck fasting lipids today.    Vitamin D Deficiency Vitamin D is at goal of 50.  Most recent vitamin D level was 55.7. She is on OTC vitamin D3 2000 IU daily. Lab Results  Component Value Date   VD25OH  55.7 06/13/2022   VD25OH 48.7 01/12/2022   VD25OH 54 08/17/2020    Plan: Continue OTC vitamin D3 2000 IU daily Low vitamin D  levels can be associated with adiposity and may result in leptin resistance and weight gain. Also associated with fatigue. Currently on vitamin D supplementation without any adverse effects.  Recheck vitamin D level today.   Mood disorder/emotional eating Onesty has had issues with stress/emotional eating. Currently this is poorly controlled. Overall mood is stable. Medication(s): None for past couple of months.  Endorses fatigue.   Plan: Resume/Continue and refill Bupropion SR 150 mg daily in am Recheck labs today- CBC, B 12, vitamin D and TSH.  Previously worked with Dr. Dewaine Conger on EE strategies in 2022. May want to re-consult   ATTESTASTION STATEMENTS:  Reviewed by clinician on day of visit: allergies, medications, problem list, medical history, surgical history, family history, social history, and previous encounter notes.   I have personally spent 37 minutes total time today in preparation, patient care, nutritional counseling and documentation for this visit, including the following: review of clinical lab tests; review of medical tests/procedures/services.      Bruk Tumolo, PA-C

## 2023-01-09 ENCOUNTER — Encounter (INDEPENDENT_AMBULATORY_CARE_PROVIDER_SITE_OTHER): Payer: Self-pay | Admitting: Physician Assistant

## 2023-01-09 ENCOUNTER — Ambulatory Visit (INDEPENDENT_AMBULATORY_CARE_PROVIDER_SITE_OTHER): Payer: Commercial Managed Care - PPO | Admitting: Physician Assistant

## 2023-01-09 ENCOUNTER — Other Ambulatory Visit (HOSPITAL_COMMUNITY): Payer: Self-pay

## 2023-01-09 VITALS — BP 127/82 | HR 74 | Temp 98.1°F | Ht 65.0 in | Wt 210.0 lb

## 2023-01-09 DIAGNOSIS — E669 Obesity, unspecified: Secondary | ICD-10-CM

## 2023-01-09 DIAGNOSIS — F39 Unspecified mood [affective] disorder: Secondary | ICD-10-CM | POA: Diagnosis not present

## 2023-01-09 DIAGNOSIS — E559 Vitamin D deficiency, unspecified: Secondary | ICD-10-CM | POA: Diagnosis not present

## 2023-01-09 DIAGNOSIS — E785 Hyperlipidemia, unspecified: Secondary | ICD-10-CM | POA: Diagnosis not present

## 2023-01-09 DIAGNOSIS — E7849 Other hyperlipidemia: Secondary | ICD-10-CM | POA: Diagnosis not present

## 2023-01-09 DIAGNOSIS — R7303 Prediabetes: Secondary | ICD-10-CM

## 2023-01-09 DIAGNOSIS — Z6834 Body mass index (BMI) 34.0-34.9, adult: Secondary | ICD-10-CM | POA: Diagnosis not present

## 2023-01-09 MED ORDER — BUPROPION HCL ER (SR) 150 MG PO TB12
150.0000 mg | ORAL_TABLET | Freq: Every day | ORAL | 1 refills | Status: DC
  Filled 2023-01-09: qty 30, 30d supply, fill #0

## 2023-01-09 MED ORDER — METFORMIN HCL 500 MG PO TABS
500.0000 mg | ORAL_TABLET | Freq: Two times a day (BID) | ORAL | 0 refills | Status: DC
  Filled 2023-01-09: qty 60, 30d supply, fill #0

## 2023-01-10 ENCOUNTER — Encounter (INDEPENDENT_AMBULATORY_CARE_PROVIDER_SITE_OTHER): Payer: Self-pay | Admitting: Physician Assistant

## 2023-01-10 LAB — LIPID PANEL WITH LDL/HDL RATIO
Cholesterol, Total: 148 mg/dL (ref 100–199)
HDL: 43 mg/dL (ref >39)
LDL Chol Calc (NIH): 86 mg/dL (ref 0–99)
LDL/HDL Ratio: 2 ratio (ref 0.0–3.2)
Triglycerides: 101 mg/dL (ref 0–149)
VLDL Cholesterol Cal: 19 mg/dL (ref 5–40)

## 2023-01-10 LAB — VITAMIN B12: Vitamin B-12: 811 pg/mL (ref 232–1245)

## 2023-01-10 LAB — CBC WITH DIFFERENTIAL/PLATELET
Basophils Absolute: 0 10*3/uL (ref 0.0–0.2)
Basos: 1 %
EOS (ABSOLUTE): 0.2 10*3/uL (ref 0.0–0.4)
Eos: 2 %
Hematocrit: 39.9 % (ref 34.0–46.6)
Hemoglobin: 12.9 g/dL (ref 11.1–15.9)
Immature Grans (Abs): 0 10*3/uL (ref 0.0–0.1)
Immature Granulocytes: 0 %
Lymphocytes Absolute: 2.1 10*3/uL (ref 0.7–3.1)
Lymphs: 32 %
MCH: 29.7 pg (ref 26.6–33.0)
MCHC: 32.3 g/dL (ref 31.5–35.7)
MCV: 92 fL (ref 79–97)
Monocytes Absolute: 0.5 10*3/uL (ref 0.1–0.9)
Monocytes: 8 %
Neutrophils Absolute: 3.8 10*3/uL (ref 1.4–7.0)
Neutrophils: 57 %
Platelets: 211 10*3/uL (ref 150–450)
RBC: 4.34 x10E6/uL (ref 3.77–5.28)
RDW: 12.6 % (ref 11.7–15.4)
WBC: 6.6 10*3/uL (ref 3.4–10.8)

## 2023-01-10 LAB — CMP14+EGFR
ALT: 22 IU/L (ref 0–32)
AST: 19 IU/L (ref 0–40)
Albumin: 4.1 g/dL (ref 3.8–4.9)
Alkaline Phosphatase: 98 IU/L (ref 44–121)
BUN/Creatinine Ratio: 24 (ref 12–28)
BUN: 17 mg/dL (ref 8–27)
Bilirubin Total: 0.7 mg/dL (ref 0.0–1.2)
CO2: 25 mmol/L (ref 20–29)
Calcium: 9.3 mg/dL (ref 8.7–10.3)
Chloride: 101 mmol/L (ref 96–106)
Creatinine, Ser: 0.72 mg/dL (ref 0.57–1.00)
Globulin, Total: 2.8 g/dL (ref 1.5–4.5)
Glucose: 112 mg/dL — ABNORMAL HIGH (ref 70–99)
Potassium: 4.3 mmol/L (ref 3.5–5.2)
Sodium: 140 mmol/L (ref 134–144)
Total Protein: 6.9 g/dL (ref 6.0–8.5)
eGFR: 96 mL/min/{1.73_m2} (ref >59)

## 2023-01-10 LAB — TSH: TSH: 1.51 u[IU]/mL (ref 0.450–4.500)

## 2023-01-10 LAB — HEMOGLOBIN A1C
Est. average glucose Bld gHb Est-mCnc: 143 mg/dL
Hgb A1c MFr Bld: 6.6 % — ABNORMAL HIGH (ref 4.8–5.6)

## 2023-01-10 LAB — VITAMIN D 25 HYDROXY (VIT D DEFICIENCY, FRACTURES): Vit D, 25-Hydroxy: 49.8 ng/mL (ref 30.0–100.0)

## 2023-01-10 LAB — INSULIN, RANDOM: INSULIN: 23.6 u[IU]/mL (ref 2.6–24.9)

## 2023-01-16 ENCOUNTER — Encounter: Payer: No Typology Code available for payment source | Admitting: Medical

## 2023-01-16 NOTE — Progress Notes (Unsigned)
.smr  Office: [phone]  /  Fax: [phone]  WEIGHT SUMMARY AND BIOMETRICS  Vitals Temp: 98.3 F (36.8 C) BP: 116/73 Pulse Rate: 74 SpO2: 96 %   Anthropometric Measurements Height: 5\' 4"  (1.626 m) (rechecked height today) Weight: 208 lb (94.3 kg) BMI (Calculated): 35.69 Weight at Last Visit: 210 lb Weight Lost Since Last Visit: 2 lb Weight Gained Since Last Visit: 0 Starting Weight: 200 lb Total Weight Loss (lbs): 8 lb (3.629 kg) Peak Weight: 220 lb   Body Composition  Body Fat %: 39.7 % Fat Mass (lbs): 39.7 lbs Muscle Mass (lbs): 119.2 lbs Total Body Water (lbs): 81.6 lbs Visceral Fat Rating : 12   Other Clinical Data Fasting: yes Labs: no Today's Visit #: 31 Starting Date: 10/27/20     HPI  Chief Complaint: OBESITY  Victoria Garcia is here to discuss her progress with her obesity treatment plan. She is on the the Category 2 Plan and states she is following her eating plan approximately 90% of the time. She states she is exercising 0 minutes 0 times per week.   Interval History:  Since last office visit she down 2 lbs.  Bio impedence scale reviewed with the patient:  Down 1.4 lbs muscle mass Down 0.2 lbs adipose mass  Hunger/appetite-fair control.  Cravings- denies excessive cravings Stress- High work related stressors Exercise-Nothing consistent currently Hydration-Discussed goal of 80 oz of fluids daily.    Pharmacotherapy: metformin 500 mg Twice daily.   She had no GI side effects with metformin.   bupropion for cravings/emotional eating - no side effects. We discussed starting Mounjaro for type 2 diabetes management.  We have reviewed the risks and benefits of using a GLP-1 RA-GIP medication, Mounjaro. The patient denies a personal or family history of medullary thyroid cancer or MENII. The patient denies a history of pancreatitis. The potential risks and benefits of Mounjaro were reviewed with the patient, and alternative treatment options were  discussed. All questions were answered, and the patient wishes to move forward with starting Mounjaro at this time for management of Type 2 diabetes.    She is on was Ozempic/Wegovy and then [name] in 2023-but no insurance coverage for this since January 2024, she is off of all GLP-1 medications since that time.   TREATMENT PLAN FOR OBESITY:  Recommended Dietary Goals  Victoria Garcia is currently in the action stage of change. As such, her goal is to continue weight management plan. She has agreed to the Category 2 Plan.  Behavioral Intervention  We discussed the following Behavioral Modification Strategies today: increasing lean protein intake, decreasing simple carbohydrates , increasing vegetables, increasing lower glycemic fruits, increasing fiber rich foods, avoiding skipping meals, increasing water intake, emotional eating strategies and understanding the difference between hunger signals and cravings, work on managing stress, creating time for self-care and relaxation measures, continue to practice mindfulness when eating, and planning for success.  Additional resources provided today: NA  Recommended Physical Activity Goals  Victoria Garcia has been advised to work up to 150 minutes of moderate intensity aerobic activity a week and strengthening exercises 2-3 times per week for cardiovascular health, weight loss maintenance and preservation of muscle mass.   She has agreed to Continue current level of physical activity  and Think about ways to increase daily physical activity and overcoming barriers to exercise   Pharmacotherapy We discussed various medication options to help Victoria Garcia with her weight loss efforts and we both agreed to begin [facility] for new onset Type 2 diabetes and continue  metformin for diabetes and insulin resistance, bupropion for emotional eating/cravings.    Return in about 3 weeks (around 02/07/2023).Marland Kitchen She was informed of the importance of frequent follow up visits to  maximize her success with intensive lifestyle modifications for her multiple health conditions.  PHYSICAL EXAM:  Blood pressure 116/73, pulse 74, temperature 98.3 F (36.8 C), height 5\' 4"  (1.626 m), weight 208 lb (94.3 kg), SpO2 96%. Body mass index is 35.7 kg/m.  General: She is overweight, cooperative, alert, well developed, and in no acute distress. PSYCH: Has normal mood, affect and thought process.   Cardiovascular: HR 70's regular, BP 116/73 Lungs: Normal breathing effort, no conversational dyspnea.  DIAGNOSTIC DATA REVIEWED:  BMET    Component Value Date/Time   NA 140 01/09/2023 0833   K 4.3 01/09/2023 0833   CL 101 01/09/2023 0833   CO2 25 01/09/2023 0833   GLUCOSE 112 (H) 01/09/2023 0833   GLUCOSE 98 02/17/2021 0825   BUN 17 01/09/2023 0833   CREATININE 0.72 01/09/2023 0833   CREATININE 0.73 02/17/2021 0825   CALCIUM 9.3 01/09/2023 0833   GFRNONAA 96 08/17/2020 1000   GFRAA 111 08/17/2020 1000   Lab Results  Component Value Date   HGBA1C 6.6 (H) 01/09/2023   HGBA1C 6.5 (H) 05/07/2014   Lab Results  Component Value Date   INSULIN 23.6 01/09/2023   INSULIN 14.3 10/27/2020   Lab Results  Component Value Date   TSH 1.510 01/09/2023   CBC    Component Value Date/Time   WBC 6.6 01/09/2023 0833   WBC 7.0 08/17/2020 1000   RBC 4.34 01/09/2023 0833   RBC 4.48 08/17/2020 1000   HGB 12.9 01/09/2023 0833   HCT 39.9 01/09/2023 0833   PLT 211 01/09/2023 0833   MCV 92 01/09/2023 0833   MCH 29.7 01/09/2023 0833   MCH 30.1 08/17/2020 1000   MCHC 32.3 01/09/2023 0833   MCHC 33.3 08/17/2020 1000   RDW 12.6 01/09/2023 0833   Iron Studies    Component Value Date/Time   IRON 50 09/24/2014 1018   Lipid Panel     Component Value Date/Time   CHOL 148 01/09/2023 0833   TRIG 101 01/09/2023 0833   HDL 43 01/09/2023 0833   CHOLHDL 4.2 01/12/2022 1552   CHOLHDL 4.4 02/17/2021 0825   VLDL 21 01/13/2017 0924   LDLCALC 86 01/09/2023 0833   LDLCALC 105 (H)  02/17/2021 0825   LDLDIRECT 85 06/17/2021 1026   Hepatic Function Panel     Component Value Date/Time   PROT 6.9 01/09/2023 0833   ALBUMIN 4.1 01/09/2023 0833   AST 19 01/09/2023 0833   ALT 22 01/09/2023 0833   ALKPHOS 98 01/09/2023 0833   BILITOT 0.7 01/09/2023 0833      Component Value Date/Time   TSH 1.510 01/09/2023 0833   Nutritional Lab Results  Component Value Date   VD25OH 49.8 01/09/2023   VD25OH 55.7 06/13/2022   VD25OH 48.7 01/12/2022    ASSOCIATED CONDITIONS ADDRESSED TODAY  ASSESSMENT AND PLAN  Problem List Items Addressed This Visit     Generalized obesity   Relevant Medications   tirzepatide (MOUNJARO) 2.5 MG/0.5ML Pen   New diagnosis of Type 2 diabetes without long term current  insulin use - Primary   Relevant Medications   tirzepatide (MOUNJARO) 2.5 MG/0.5ML Pen   Other Visit Diagnoses     Hyperlipidemia associated with type 2 diabetes mellitus (HCC)       Relevant Medications   tirzepatide (MOUNJARO) 2.5 MG/0.5ML  Pen   BMI 35.0-35.9,adult Current BMI 35.8          New diagnosis of Type 2 Diabetes Mellitus with hyperglycemia, without long-term current use of insulin Labs were reviewed today and discussed with the patient.  HgbA1c is not at goal. Last A1c was 6.6- worsening- Now in Type 2 diabetic range. Discussed treatment options including use of Mounjaro as noted. .  On Statin therapy. Not on ACE or ARB.  Medication(s): Metformin 500 mg twice daily with meals No GI or other side effects with metformin.  She is working on [employer] to decrease simple carbohydrates, increase lean proteins and exercise to promote weight loss, improve glycemic control and prevent progression to Type 2 diabetes.    Lab Results  Component Value Date   HGBA1C 6.6 (H) 01/09/2023   HGBA1C 6.4 (A) 10/04/2022   HGBA1C 6.0 (H) 06/13/2022   Lab Results  Component Value Date   LDLCALC 86 01/09/2023   CREATININE 0.72 01/09/2023   No results found for:  "GFR"  Plan: Start Mounjaro 2.5 mg SQ weekly and continue metformin 500 mg twice daily.  Continue working on nutrition plan to decrease simple carbohydrates, increase lean proteins and exercise to promote weight loss, improve glycemic control and prevent progression to Type 2 diabetes.    Hyperlipidemia associated with Type 2 diabetes Labs were reviewed today and discussed with the patient.  LDL is at goal. HDL 43- just above goal . Trig 101- at goal of < 150.  Medication(s): Lipitor 40 mg daily Cardiovascular risk factors: diabetes mellitus, dyslipidemia, and obesity (BMI >= 30 kg/m2)  Lab Results  Component Value Date   CHOL 148 01/09/2023   HDL 43 01/09/2023   LDLCALC 86 01/09/2023   LDLDIRECT 85 06/17/2021   TRIG 101 01/09/2023   CHOLHDL 4.2 01/12/2022   CHOLHDL 3.6 06/17/2021   CHOLHDL 4.4 02/17/2021   Lab Results  Component Value Date   ALT 22 01/09/2023   AST 19 01/09/2023   ALKPHOS 98 01/09/2023   BILITOT 0.7 01/09/2023   The 10-year ASCVD risk score (Arnett DK, et al., 2019) is: 8.7%   Values used to calculate the score:     Age: 38 years     Sex: Female     Is Non-Hispanic African American: Yes     Diabetic: Yes     Tobacco smoker: No     Systolic Blood Pressure: 116 mmHg     Is BP treated: No     HDL Cholesterol: 43 mg/dL     Total Cholesterol: 148 mg/dL  Plan: Continue statin, Lipitor 40 mg daily. Start Mounjaro for Type 2 diabetes management which should lower CV risks as well.  Continue to work on nutrition plan -decreasing simple carbohydrates, increasing lean proteins, decreasing saturated fats and cholesterol , avoiding trans fats and exercise as able to promote weight loss, improve lipids and decrease cardiovascular risks.    ATTESTASTION STATEMENTS:  Reviewed by clinician on day of visit: allergies, medications, problem list, medical history, surgical history, family history, social history, and previous encounter notes.   I have personally  spent 40 minutes total time today in preparation, patient care, nutritional counseling and documentation for this visit, including the following: review of clinical lab tests; review of medical tests/procedures/services.      Birdell Frasier, PA-C

## 2023-01-17 ENCOUNTER — Encounter (INDEPENDENT_AMBULATORY_CARE_PROVIDER_SITE_OTHER): Payer: Self-pay | Admitting: Physician Assistant

## 2023-01-17 ENCOUNTER — Ambulatory Visit (INDEPENDENT_AMBULATORY_CARE_PROVIDER_SITE_OTHER): Payer: Commercial Managed Care - PPO | Admitting: Physician Assistant

## 2023-01-17 ENCOUNTER — Other Ambulatory Visit (HOSPITAL_COMMUNITY): Payer: Self-pay

## 2023-01-17 VITALS — BP 116/73 | HR 74 | Temp 98.3°F | Ht 64.0 in | Wt 208.0 lb

## 2023-01-17 DIAGNOSIS — E1165 Type 2 diabetes mellitus with hyperglycemia: Secondary | ICD-10-CM | POA: Diagnosis not present

## 2023-01-17 DIAGNOSIS — Z6835 Body mass index (BMI) 35.0-35.9, adult: Secondary | ICD-10-CM

## 2023-01-17 DIAGNOSIS — Z7984 Long term (current) use of oral hypoglycemic drugs: Secondary | ICD-10-CM

## 2023-01-17 DIAGNOSIS — E669 Obesity, unspecified: Secondary | ICD-10-CM

## 2023-01-17 DIAGNOSIS — E559 Vitamin D deficiency, unspecified: Secondary | ICD-10-CM

## 2023-01-17 DIAGNOSIS — E1169 Type 2 diabetes mellitus with other specified complication: Secondary | ICD-10-CM

## 2023-01-17 DIAGNOSIS — E785 Hyperlipidemia, unspecified: Secondary | ICD-10-CM

## 2023-01-17 DIAGNOSIS — E119 Type 2 diabetes mellitus without complications: Secondary | ICD-10-CM | POA: Insufficient documentation

## 2023-01-17 DIAGNOSIS — Z7985 Long-term (current) use of injectable non-insulin antidiabetic drugs: Secondary | ICD-10-CM | POA: Diagnosis not present

## 2023-01-17 DIAGNOSIS — F39 Unspecified mood [affective] disorder: Secondary | ICD-10-CM

## 2023-01-17 MED ORDER — TIRZEPATIDE 2.5 MG/0.5ML ~~LOC~~ SOAJ
2.5000 mg | SUBCUTANEOUS | 0 refills | Status: DC
Start: 2023-01-17 — End: 2023-02-09
  Filled 2023-01-17: qty 2, 28d supply, fill #0

## 2023-02-02 ENCOUNTER — Other Ambulatory Visit (HOSPITAL_COMMUNITY): Payer: Self-pay

## 2023-02-02 ENCOUNTER — Encounter: Payer: Self-pay | Admitting: Student

## 2023-02-02 ENCOUNTER — Ambulatory Visit (INDEPENDENT_AMBULATORY_CARE_PROVIDER_SITE_OTHER): Payer: Commercial Managed Care - PPO | Admitting: Student

## 2023-02-02 VITALS — BP 131/75 | HR 89 | Temp 98.6°F | Ht 64.0 in | Wt 210.9 lb

## 2023-02-02 DIAGNOSIS — M25561 Pain in right knee: Secondary | ICD-10-CM

## 2023-02-02 DIAGNOSIS — E1165 Type 2 diabetes mellitus with hyperglycemia: Secondary | ICD-10-CM

## 2023-02-02 DIAGNOSIS — E785 Hyperlipidemia, unspecified: Secondary | ICD-10-CM

## 2023-02-02 DIAGNOSIS — E7849 Other hyperlipidemia: Secondary | ICD-10-CM

## 2023-02-02 DIAGNOSIS — R7303 Prediabetes: Secondary | ICD-10-CM

## 2023-02-02 DIAGNOSIS — E041 Nontoxic single thyroid nodule: Secondary | ICD-10-CM | POA: Diagnosis not present

## 2023-02-02 DIAGNOSIS — E669 Obesity, unspecified: Secondary | ICD-10-CM

## 2023-02-02 DIAGNOSIS — Z0000 Encounter for general adult medical examination without abnormal findings: Secondary | ICD-10-CM | POA: Insufficient documentation

## 2023-02-02 DIAGNOSIS — E782 Mixed hyperlipidemia: Secondary | ICD-10-CM

## 2023-02-02 DIAGNOSIS — G8929 Other chronic pain: Secondary | ICD-10-CM

## 2023-02-02 MED ORDER — DICLOFENAC SODIUM 1 % EX GEL
2.0000 g | Freq: Four times a day (QID) | CUTANEOUS | 2 refills | Status: AC
Start: 2023-02-02 — End: ?
  Filled 2023-02-02: qty 100, 10d supply, fill #0
  Filled 2023-04-06: qty 100, 10d supply, fill #1

## 2023-02-02 MED ORDER — ATORVASTATIN CALCIUM 40 MG PO TABS
40.0000 mg | ORAL_TABLET | Freq: Every day | ORAL | 3 refills | Status: DC
Start: 2023-02-02 — End: 2024-02-13
  Filled 2023-02-02 – 2023-02-25 (×2): qty 90, 90d supply, fill #0
  Filled 2023-06-01: qty 90, 90d supply, fill #1
  Filled 2023-09-04: qty 90, 90d supply, fill #2
  Filled 2023-10-15 – 2023-11-30 (×2): qty 90, 90d supply, fill #3

## 2023-02-02 NOTE — Assessment & Plan Note (Signed)
>>  ASSESSMENT AND PLAN FOR NEW DIAGNOSIS OF TYPE 2 DIABETES WITHOUT LONG TERM CURRENT  INSULIN USE WRITTEN ON 02/02/2023  9:42 AM BY JUSTUS, Jeanpaul Biehl, MD  Recent hemoglobin A1c was 6.6% on 8/19.  She is currently taking metformin 500 mg twice daily and Mounjaro 2.5 mg weekly. She denies any side effects of these medications. She was seen in the healthy weight clinic in late August and at that time she was started on [facility] after a long period of time in which her insurance was refusing to cover a GLP-1. She is actively working on her dietary and lifestyle changes with a healthy weight and wellness clinic, with whom she has an appointment in 2 weeks.    Plan: - Continue metformin and Mounjaro - Follow-up appointment in 3 months to recheck A1c

## 2023-02-02 NOTE — Assessment & Plan Note (Signed)
>>  ASSESSMENT AND PLAN FOR KNEE PAIN, RIGHT WRITTEN ON 02/02/2023  9:40 AM BY JUSTUS, Rise Traeger, MD  Patient reports chronic knee pain, which improved with prior weight loss but has been worsening in the past 3 weeks.  She attributes her knee pain to her increased weight recently.  The pain is predominantly in her right knee and worsens when she is on her feet for long periods of time, which she often is at work. She has been taking ibuprofen  most days to help with the pain.  On exam, the knee is nontender to palpation and alignment of the knee appears normal.  Range of motion is intact.  There is no apparent erythema or swelling.  She has not had a knee x-ray or formal evaluation by her orthopedic surgeon.  This is most likely pain secondary to osteoarthritis, but given the acute worsening of symptoms, it could also be a tendinitis or patellofemoral pain syndrome.  For now we will treat her conservatively and follow-up at her next visit.  Plan: - Voltaren  gel up to 4 times daily as needed - Ibuprofen  or Tylenol  may be added if pain is moderate - Reevaluate pain at next visit, consider x-ray

## 2023-02-02 NOTE — Patient Instructions (Signed)
Thank you, [name] for allowing [name] to provide your care today. Today we discussed your diabetes, thyroid, and knee pain.   For diabetes, we will not make any changes at this time since you had a recent visit at the healthy weight wellness clinic.  Please continue taking your metformin and Mounjaro as prescribed.  We will recheck your A1c at a 8-month follow-up visit.  For your right knee pain, as we discussed, you may use Voltaren gel up to 4 times daily on the affected knee.  You may continue to use ibuprofen as needed, but Voltaren is preferred when your pain is mild.  If your pain continues to worsen and starts to impact her life despite over-the-counter pain medication, please let us know and we will order an x-ray and send a referral to an orthopedic specialist.  For your thyroid, we felt a slight nodule on the right side.  You had a bedside ultrasound at your appointment today, which showed a small nodule on the right.  We have put in a referral for a formal thyroid ultrasound.  Your recent thyroid labs were reassuring and within normal limits.  Tests ordered today:  Thyroid Ultrasound  I have ordered the following medications:   Meds ordered this encounter  Medications   atorvastatin (LIPITOR) 40 MG tablet    Sig: Take 1 tablet (40 mg total) by mouth at bedtime.    Dispense:  90 tablet    Refill:  3   diclofenac Sodium (VOLTAREN ARTHRITIS PAIN) 1 % GEL    Sig: Apply 2 g topically 4 (four) times daily.    Dispense:  100 g    Refill:  2     Follow up: 3 months   We look forward to seeing you next time. Please call our clinic at [phone] if you have any questions or concerns. The best time to call is Monday-Friday from 9am-4pm, but there is someone available 24/7. If after hours or the weekend, call the main hospital number and ask for the Internal Medicine Resident On-Call. If you need medication refills, please notify your pharmacy one week in advance and they will  send [name] a request.   Thank you for trusting me with your care. Wishing you the best!   Annett Fabian, MD [facility]

## 2023-02-02 NOTE — Assessment & Plan Note (Signed)
Recent hemoglobin A1c was 6.6% on 8/19.  She is currently taking metformin 500 mg twice daily and Mounjaro 2.5 mg weekly. She denies any side effects of these medications. She was seen in the healthy weight clinic in late August and at that time she was started on [facility] after a long period of time in which her insurance was refusing to cover a GLP-1. She is actively working on her dietary and lifestyle changes with a healthy weight and wellness clinic, with whom she has an appointment in 2 weeks.    Plan: - Continue metformin and Mounjaro - Follow-up appointment in 3 months to recheck A1c

## 2023-02-02 NOTE — Assessment & Plan Note (Deleted)
Recent hemoglobin A1c was 6.6% on 8/19.  She was currently taking metformin 500 mg twice daily and is using Mounjaro 2.5 mg weekly. She denies any side effects of the medication. She was seen in the healthy weight clinic in late August and at that time she was started on [facility].  She previously used GLP-1's, but her insurance stopped covering it, so she ended up regaining the weight, which discouraged her.  She is actively working on her dietary and lifestyle changes with a healthy weight and wellness clinic, with whom she has an appointment in 2 weeks.  Will make a follow-up appointment in 3 months to recheck an A1c.

## 2023-02-02 NOTE — Assessment & Plan Note (Addendum)
Patient reports chronic knee pain, which improved with prior weight loss but has been worsening in the past 3 weeks.  She attributes her knee pain to her increased weight recently.  The pain is predominantly in her right knee and worsens when she is on her feet for long periods of time, which she often is at work. She has been taking ibuprofen most days to help with the pain.  On exam, the knee is nontender to palpation and alignment of the knee appears normal.  Range of motion is intact.  There is no apparent erythema or swelling.  She has not had a knee x-ray or formal evaluation by her orthopedic surgeon.  This is most likely pain secondary to osteoarthritis, but given the acute worsening of symptoms, it could also be a tendinitis or patellofemoral pain syndrome.  For now we will treat her conservatively and follow-up at her next visit.  Plan: - Voltaren gel up to 4 times daily as needed - Ibuprofen or Tylenol may be added if pain is moderate - Reevaluate pain at next visit, consider x-ray

## 2023-02-02 NOTE — Progress Notes (Addendum)
CC: Annual physical  HPI:  [name] is a 62 y.o. female living with a history stated below and presents today for an annual physical. Please see problem based assessment and plan for additional details.  Past Medical History:  Diagnosis Date   Allergy    SEASONAL   Anemia, iron deficiency 09/24/2014   Back pain    Depression    History of stomach ulcers    Hyperlipidemia    Joint pain    Multiple food allergies    Prediabetes    Vitamin D deficiency     Current Outpatient Medications on File Prior to Visit  Medication Sig Dispense Refill   b complex vitamins capsule Take 1 capsule by mouth daily.     buPROPion (WELLBUTRIN SR) 150 MG 12 hr tablet Take 1 tablet (150 mg total) by mouth daily. 30 tablet 1   Cholecalciferol (VITAMIN D) 2000 units tablet Take 1 tablet (2,000 Units total) by mouth daily.     levocetirizine (XYZAL) 5 MG tablet Take 1 tablet (5 mg total) by mouth every evening. 60 tablet 0   metFORMIN (GLUCOPHAGE) 500 MG tablet Take 1 tablet (500 mg total) by mouth 2 (two) times daily with a meal. 60 tablet 0   Multiple Vitamin (MULTIVITAMIN WITH MINERALS) TABS tablet Take 1 tablet by mouth daily.     tirzepatide [facility]) 2.5 MG/0.5ML Pen Inject 2.5 mg into the skin once a week. 2 mL 0   No current facility-administered medications on file prior to visit.   Family History  Problem Relation Age of Onset   Cancer Mother 43       Squamous cell Primary unknown   Hypertension Mother    Hyperlipidemia Mother    Thyroid disease Mother    Cancer Father        Prostate cancer   Diabetes Father    Thyroid disease Maternal Aunt    Cancer Maternal Aunt        All mother siblings died from cancer   Thyroid disease Maternal Uncle    Cancer Maternal Uncle    Thyroid disease Maternal Grandmother    Cancer Maternal Grandmother        STOMACH CANCER   Cancer Maternal Grandfather        Prostate cancer   Social History   Socioeconomic History   Marital  status: Married    Spouse name: Victoria Garcia   Number of children: 2   Years of education: Not on file   Highest education level: Not on file  Occupational History   Occupation: Cardiovascular Specialist  Tobacco Use   Smoking status: Never   Smokeless tobacco: Never  Vaping Use   Vaping status: Never Used  Substance and Sexual Activity   Alcohol use: Yes    Comment: few drinks per month   Drug use: No   Sexual activity: Yes    Birth control/protection: Surgical  Other Topics Concern   Not on file  Social History Narrative   Married, has 2 children, boy and girl.  Works at cardiac cath lab, cardiovascular specialist, degree in radiology.  Exercise - walks at least a mile daily.    12/2021.   Social Determinants of Health   Financial Resource Strain: Not on file  Food Insecurity: Not on file  Transportation Needs: Not on file  Physical Activity: Not on file  Stress: Not on file  Social Connections: Not on file  Intimate Partner Violence: Not on file   Review  of Systems: ROS negative except for what is noted on the assessment and plan.  Vitals:   02/02/23 0851  BP: 131/75  Pulse: 89  Temp: 98.6 F (37 C)  TempSrc: Oral  SpO2: 100%  Weight: 210 lb 14.4 oz (95.7 kg)  Height: 5\' 4"  (1.626 m)   Physical Exam: Constitutional: well-appearing, in NAD HENT: PERRL, nares and mouth wnl, neck fullness over R thyroid lobe, non-tender, nodule palpated Cardiovascular: regular rate and rhythm, no m/r/g Pulmonary/Chest: CTA bilaterally, normal WOB Abdominal: soft, non-tender, non-distended MSK: normal bulk and tone; ROM and strength normal; R knee is appropriately aligned and non-tender, no swelling noted.  Neurological: alert & oriented x 3, no focal deficits, no sensory or strength deficits Skin: warm and dry Psych: normal mood and behavior  Assessment & Plan:   Patient seen with Dr. Oswaldo Garcia  Hyperlipidemia Patient is currently taking atorvastatin 40 mg daily.  She  denies any side effects or problems with this medication.  She is also following with healthy weight and wellness clinic and working on making dietary improvements to improve her lipid levels.  We will refill her atorvastatin and recheck lipid panel in January.  Obesity (BMI 30.0-34.9) Patient's BMI today is 36.20.  She follows regularly with the healthy weight and wellness clinic family medicine department.  She says she feels like this is going well and has helped her.  She has been taking Mounjaro for the past 2 weeks and denies any side effects.  She has a follow-up visit with a healthy weight clinic in 2 weeks to consider increasing her Mounjaro dose.  Knee pain, right Patient reports chronic knee pain, which improved with prior weight loss but has been worsening in the past 3 weeks.  She attributes her knee pain to her increased weight recently.  The pain is predominantly in her right knee and worsens when she is on her feet for long periods of time, which she often is at work. She has been taking ibuprofen most days to help with the pain.  On exam, the knee is nontender to palpation and alignment of the knee appears normal.  Range of motion is intact.  There is no apparent erythema or swelling.  She has not had a knee x-ray or formal evaluation by her orthopedic surgeon.  This is most likely pain secondary to osteoarthritis, but given the acute worsening of symptoms, it could also be a tendinitis or patellofemoral pain syndrome.  For now we will treat her conservatively and follow-up at her next visit.  Plan: - Voltaren gel up to 4 times daily as needed - Ibuprofen or Tylenol may be added if pain is moderate - Reevaluate pain at next visit, consider x-ray  New diagnosis of Type 2 diabetes without long term current  insulin use Recent hemoglobin A1c was 6.6% on 8/19.  She is currently taking metformin 500 mg twice daily and Mounjaro 2.5 mg weekly. She denies any side effects of these medications.  She was seen in the healthy weight clinic in late August and at that time she was started on [facility] after a long period of time in which her insurance was refusing to cover a GLP-1. She is actively working on her dietary and lifestyle changes with a healthy weight and wellness clinic, with whom she has an appointment in 2 weeks.    Plan: - Continue metformin and Mounjaro - Follow-up appointment in 3 months to recheck A1c   Thyroid nodule On exam today, so right-sided neck  fullness was noted and a small nodule was palpated in the right thyroid lobe.  This was confirmed by bedside POCUS.  She denies any current active symptoms of hyper/hypothyroidism. TSH was 1.5 a few weeks ago. She does have a family history of thyroid conditions.   Plan -Order placed for thyroid ultrasound  Annett Fabian, MD  [facility], PGY-1 Phone: [phone] Date 02/02/2023 Time 9:44 AM

## 2023-02-02 NOTE — Assessment & Plan Note (Signed)
On exam today, so right-sided neck fullness was noted and a small nodule was palpated in the right thyroid lobe.  This was confirmed by bedside POCUS.  She denies any current active symptoms of hyper/hypothyroidism. TSH was 1.5 a few weeks ago. She does have a family history of thyroid conditions.   Plan -Order placed for thyroid ultrasound

## 2023-02-02 NOTE — Assessment & Plan Note (Addendum)
Patient's BMI today is 36.20.  She follows regularly with the healthy weight and wellness clinic family medicine department.  She says she feels like this is going well and has helped her.  She has been taking Mounjaro for the past 2 weeks and denies any side effects.  She has a follow-up visit with a healthy weight clinic in 2 weeks to consider increasing her Mounjaro dose.

## 2023-02-02 NOTE — Assessment & Plan Note (Addendum)
Patient is currently taking atorvastatin 40 mg daily.  She denies any side effects or problems with this medication.  She is also following with healthy weight and wellness clinic and working on making dietary improvements to improve her lipid levels.  We will refill her atorvastatin and recheck lipid panel in January.

## 2023-02-06 NOTE — Progress Notes (Signed)
Internal Medicine Clinic Attending  I was physically present during the key portions of the resident provided service and participated in the medical decision making of patient's management care. I reviewed pertinent patient test results.  The assessment, diagnosis, and plan were formulated together and I agree with the documentation in the resident's note.  Erlinda Hong, MD FACP

## 2023-02-09 ENCOUNTER — Encounter (INDEPENDENT_AMBULATORY_CARE_PROVIDER_SITE_OTHER): Payer: Self-pay | Admitting: Family Medicine

## 2023-02-09 ENCOUNTER — Other Ambulatory Visit (HOSPITAL_COMMUNITY): Payer: Self-pay

## 2023-02-09 ENCOUNTER — Ambulatory Visit (INDEPENDENT_AMBULATORY_CARE_PROVIDER_SITE_OTHER): Payer: Commercial Managed Care - PPO | Admitting: Family Medicine

## 2023-02-09 VITALS — BP 123/67 | HR 79 | Temp 98.7°F | Ht 64.0 in | Wt 205.0 lb

## 2023-02-09 DIAGNOSIS — F39 Unspecified mood [affective] disorder: Secondary | ICD-10-CM

## 2023-02-09 DIAGNOSIS — Z6835 Body mass index (BMI) 35.0-35.9, adult: Secondary | ICD-10-CM | POA: Diagnosis not present

## 2023-02-09 DIAGNOSIS — Z7984 Long term (current) use of oral hypoglycemic drugs: Secondary | ICD-10-CM

## 2023-02-09 DIAGNOSIS — Z7985 Long-term (current) use of injectable non-insulin antidiabetic drugs: Secondary | ICD-10-CM | POA: Diagnosis not present

## 2023-02-09 DIAGNOSIS — E669 Obesity, unspecified: Secondary | ICD-10-CM | POA: Diagnosis not present

## 2023-02-09 DIAGNOSIS — E1165 Type 2 diabetes mellitus with hyperglycemia: Secondary | ICD-10-CM | POA: Diagnosis not present

## 2023-02-09 MED ORDER — BUPROPION HCL ER (SR) 150 MG PO TB12
150.0000 mg | ORAL_TABLET | Freq: Every day | ORAL | 1 refills | Status: DC
Start: 2023-02-09 — End: 2023-03-06
  Filled 2023-02-09 – 2023-02-10 (×2): qty 30, 30d supply, fill #0

## 2023-02-09 MED ORDER — TIRZEPATIDE 5 MG/0.5ML ~~LOC~~ SOAJ
5.0000 mg | SUBCUTANEOUS | 0 refills | Status: DC
Start: 2023-02-09 — End: 2023-03-06
  Filled 2023-02-09 – 2023-02-13 (×3): qty 2, 28d supply, fill #0

## 2023-02-09 MED ORDER — METFORMIN HCL 500 MG PO TABS
500.0000 mg | ORAL_TABLET | Freq: Two times a day (BID) | ORAL | 0 refills | Status: DC
  Filled 2023-02-09 – 2023-02-10 (×2): qty 60, 30d supply, fill #0

## 2023-02-09 NOTE — Progress Notes (Signed)
Victoria Garcia, D.O.  ABFM, ABOM Specializing in Clinical Bariatric Medicine  Office located at: [address] [place], [place]  [address]     Assessment and Plan:   Medications Discontinued During This Encounter  Medication Reason   tirzepatide (MOUNJARO) 2.5 MG/0.5ML Pen    buPROPion (WELLBUTRIN SR) 150 MG 12 hr tablet Reorder   metFORMIN (GLUCOPHAGE) 500 MG tablet Reorder     Meds ordered this encounter  Medications   buPROPion (WELLBUTRIN SR) 150 MG 12 hr tablet    Sig: Take 1 tablet (150 mg total) by mouth daily.    Dispense:  30 tablet    Refill:  1    Ov for rf   metFORMIN (GLUCOPHAGE) 500 MG tablet    Sig: Take 1 tablet (500 mg total) by mouth 2 (two) times daily with a meal.    Dispense:  60 tablet    Refill:  0   tirzepatide (MOUNJARO) 5 MG/0.5ML Pen    Sig: Inject 5 mg into the skin once a week.    Dispense:  2 mL    Refill:  0     Mood disorder (HCC), emotional eating Assessment & Plan: No issues with Wellbutrin SR 150 mg daily, compliance good. Mood has been stable. Reports that work dynamics have improved- [name] working shorter days. Has a counselor and walks for stress management. Endorses that [name] successfully implementating the strategies she learned from Dr.Barker. Will refill Wellbutrin today- no dose change. C/w healthy habits and eating.    New diagnosis of Type 2 diabetes without long term current  insulin use Assessment & Plan: Lab Results  Component Value Date   HGBA1C 6.6 (H) 01/09/2023   HGBA1C 6.4 (A) 10/04/2022   HGBA1C 6.0 (H) 06/13/2022   INSULIN 23.6 01/09/2023   INSULIN 13.8 06/13/2022   INSULIN 14.3 10/27/2020   Diabetes mellitus treated with Metformin 500 mg bid & Mounjaro 2.5 mg once a wk. Pt was started on Mounjaro last OV (8/27) with Victoria Garcia. Pt Garcia responding to [facility] well and notes experiencing "earlier satiety and a quieting of the food noise in my head". Pt does report still having some hunger in the am. We  agreed mutually to increase Mounjaro to 5 mg once a wk to help with her morning hunger and to further aid with weight loss. Additionally, we will refill Metformin today - no dose change.    BMI 33.0-33.9,adult Current BMI 33.5 Obesity, Current BMI 31.8 Assessment & Plan: Since last office visit on 01/17/23 patient's muscle mass has increased by 5.8 lb. Fat mass Garcia 73.8 lbs.Total body water has not changed. Counseling done on how various foods will affect these numbers and how to maximize success  Total lbs lost to date: + 5 lbs  Total weight loss percentage to date: 2.50%   No change to meal plan - see Subjective  Protein Equivalence handout explained to pt. I showed her different forms of protein that she can have: [place] Yogurt, cup of [employer] Skim Milk, Pillars Greek Yogurt drink, etc..  Behavioral Intervention Additional resources provided today: protein equivalence handout Evidence-based interventions for health behavior change were utilized today including the discussion of self monitoring techniques, problem-solving barriers and SMART goal setting techniques.   Regarding patient's less desirable eating habits and patterns, we employed the technique of small changes.  Pt will specifically work on: continuing to increase lean protein intake  for next visit.    FOLLOW UP: Return in about  25 days (around 03/06/2023). She was informed of the importance of frequent follow up visits to maximize her success with intensive lifestyle modifications for her multiple health conditions.  Subjective:   Chief complaint: Obesity Victoria Garcia here to discuss her progress with her obesity treatment plan. [name] on the Category 2 Plan and states [name] following her eating plan approximately 90% of the time. She states [name] walking 45 minutes 5 days per week.   Interval History:  Victoria Garcia Garcia here for a follow up office visit. The last time I saw Victoria Garcia was on 03/2022. She typically sees Victoria Garcia.  Victoria Garcia reports "getting back on track" with the meal plan. She started Mounjaro 2.5 mg once a wk per Victoria Garcia & has been tolerating it well, without any adverse effects. Overall, she has noticed earlier satiety and a decrease in food noise. She reports being satisified with her meal plan.   Pharmacotherapy for weight loss: [name] currently taking  Metformin 500 mg bid, Wellbutrin SR 150 mg daily, Mounjaro 2.5 mg once wkly  for medical weight loss.  Denies side effects.   Review of Systems:  Pertinent positives were addressed with patient today.  Reviewed by clinician on day of visit: allergies, medications, problem list, medical history, surgical history, family history, social history, and previous encounter notes.  Weight Summary and Biometrics   Weight Lost Since Last Visit: 3lb  Weight Gained Since Last Visit: 0lb   Vitals Temp: 98.7 F (37.1 C) BP: 123/67 Pulse Rate: 79 SpO2: 99 %   Anthropometric Measurements Height: 5\' 4"  (1.626 m) Weight: 205 lb (93 kg) BMI (Calculated): 35.17 Weight at Last Visit: 208lb Weight Lost Since Last Visit: 3lb Weight Gained Since Last Visit: 0lb Starting Weight: 200lb Total Weight Loss (lbs): 11 lb (4.99 kg) Peak Weight: 220lb   Body Composition  Body Fat %: 35.9 % Fat Mass (lbs): 73.8 lbs Muscle Mass (lbs): 125 lbs Total Body Water (lbs): 81.6 lbs Visceral Fat Rating : 11   Other Clinical Data Fasting: no Labs: no Today's Visit #: 32 Starting Date: 10/27/20    Objective:   PHYSICAL EXAM: Blood pressure 123/67, pulse 79, temperature 98.7 F (37.1 C), height 5\' 4"  (1.626 m), weight 205 lb (93 kg), SpO2 99%. Body mass index Garcia 35.19 kg/m.  General: Well Developed, well nourished, and in no acute distress.  HEENT: Normocephalic, atraumatic Skin: Warm and dry, cap RF less 2 sec, good turgor Chest:  Normal excursion, shape, no gross abn Respiratory: speaking in full sentences, no conversational dyspnea NeuroM-Sk: Ambulates  w/o assistance, moves * 4 Psych: A and O *3, insight good, mood-full  DIAGNOSTIC DATA REVIEWED:  BMET    Component Value Date/Time   NA 140 01/09/2023 0833   K 4.3 01/09/2023 0833   CL 101 01/09/2023 0833   CO2 25 01/09/2023 0833   GLUCOSE 112 (H) 01/09/2023 0833   GLUCOSE 98 02/17/2021 0825   BUN 17 01/09/2023 0833   CREATININE 0.72 01/09/2023 0833   CREATININE 0.73 02/17/2021 0825   CALCIUM 9.3 01/09/2023 0833   GFRNONAA 96 08/17/2020 1000   GFRAA 111 08/17/2020 1000   Lab Results  Component Value Date   HGBA1C 6.6 (H) 01/09/2023   HGBA1C 6.5 (H) 05/07/2014   Lab Results  Component Value Date   INSULIN 23.6 01/09/2023   INSULIN 14.3 10/27/2020   Lab Results  Component Value Date   TSH 1.510 01/09/2023   CBC    Component Value Date/Time  WBC 6.6 01/09/2023 0833   WBC 7.0 08/17/2020 1000   RBC 4.34 01/09/2023 0833   RBC 4.48 08/17/2020 1000   HGB 12.9 01/09/2023 0833   HCT 39.9 01/09/2023 0833   PLT 211 01/09/2023 0833   MCV 92 01/09/2023 0833   MCH 29.7 01/09/2023 0833   MCH 30.1 08/17/2020 1000   MCHC 32.3 01/09/2023 0833   MCHC 33.3 08/17/2020 1000   RDW 12.6 01/09/2023 0833   Iron Studies    Component Value Date/Time   IRON 50 09/24/2014 1018   Lipid Panel     Component Value Date/Time   CHOL 148 01/09/2023 0833   TRIG 101 01/09/2023 0833   HDL 43 01/09/2023 0833   CHOLHDL 4.2 01/12/2022 1552   CHOLHDL 4.4 02/17/2021 0825   VLDL 21 01/13/2017 0924   LDLCALC 86 01/09/2023 0833   LDLCALC 105 (H) 02/17/2021 0825   LDLDIRECT 85 06/17/2021 1026   Hepatic Function Panel     Component Value Date/Time   PROT 6.9 01/09/2023 0833   ALBUMIN 4.1 01/09/2023 0833   AST 19 01/09/2023 0833   ALT 22 01/09/2023 0833   ALKPHOS 98 01/09/2023 0833   BILITOT 0.7 01/09/2023 0833      Component Value Date/Time   TSH 1.510 01/09/2023 0833   Nutritional Lab Results  Component Value Date   VD25OH 49.8 01/09/2023   VD25OH 55.7 06/13/2022   VD25OH 48.7  01/12/2022    Attestations:   I, Special Puri, acting as a Stage manager for [employer], DO., have compiled all relevant documentation for today's office visit on behalf of Thomasene Lot, DO, while in the presence of [employer], DO.  I have reviewed the above documentation for accuracy and completeness, and I agree with the above. Victoria Garcia, D.O.  The 21st Century Cures Act was signed into law in 2016 which includes the topic of electronic health records.  This provides immediate access to information in MyChart.  This includes consultation notes, operative notes, office notes, lab results and pathology reports.  If you have any questions about what you read please let us know at your next visit so we can discuss your concerns and take corrective action if need be.  We are right here with you.

## 2023-02-10 ENCOUNTER — Other Ambulatory Visit (HOSPITAL_COMMUNITY): Payer: Self-pay

## 2023-02-10 ENCOUNTER — Other Ambulatory Visit: Payer: Self-pay

## 2023-02-13 ENCOUNTER — Other Ambulatory Visit (HOSPITAL_COMMUNITY): Payer: Self-pay

## 2023-02-13 ENCOUNTER — Telehealth: Payer: Self-pay

## 2023-02-13 NOTE — Telephone Encounter (Signed)
Started PA for [facility] via covermymeds.

## 2023-02-14 ENCOUNTER — Other Ambulatory Visit (HOSPITAL_COMMUNITY): Payer: Self-pay

## 2023-02-20 ENCOUNTER — Ambulatory Visit (HOSPITAL_COMMUNITY): Payer: Commercial Managed Care - PPO

## 2023-02-20 ENCOUNTER — Ambulatory Visit (HOSPITAL_COMMUNITY)
Admission: RE | Admit: 2023-02-20 | Discharge: 2023-02-20 | Disposition: A | Discharge status: Home / Self Care | Payer: Commercial Managed Care - PPO | Source: Ambulatory Visit | Attending: Student in an Organized Health Care Education/Training Program | Admitting: Student in an Organized Health Care Education/Training Program

## 2023-02-20 DIAGNOSIS — E041 Nontoxic single thyroid nodule: Secondary | ICD-10-CM | POA: Insufficient documentation

## 2023-02-20 DIAGNOSIS — R221 Localized swelling, mass and lump, neck: Secondary | ICD-10-CM | POA: Diagnosis not present

## 2023-02-26 ENCOUNTER — Other Ambulatory Visit (HOSPITAL_COMMUNITY): Payer: Self-pay

## 2023-02-27 ENCOUNTER — Other Ambulatory Visit (HOSPITAL_COMMUNITY): Payer: Self-pay

## 2023-03-06 ENCOUNTER — Ambulatory Visit (INDEPENDENT_AMBULATORY_CARE_PROVIDER_SITE_OTHER): Payer: Commercial Managed Care - PPO | Admitting: Physician Assistant

## 2023-03-06 ENCOUNTER — Encounter (INDEPENDENT_AMBULATORY_CARE_PROVIDER_SITE_OTHER): Payer: Self-pay | Admitting: Physician Assistant

## 2023-03-06 ENCOUNTER — Other Ambulatory Visit (HOSPITAL_COMMUNITY): Payer: Self-pay

## 2023-03-06 VITALS — BP 114/71 | HR 66 | Temp 98.8°F | Ht 64.0 in | Wt 201.0 lb

## 2023-03-06 DIAGNOSIS — F5089 Other specified eating disorder: Secondary | ICD-10-CM | POA: Diagnosis not present

## 2023-03-06 DIAGNOSIS — Z6834 Body mass index (BMI) 34.0-34.9, adult: Secondary | ICD-10-CM | POA: Diagnosis not present

## 2023-03-06 DIAGNOSIS — J3089 Other allergic rhinitis: Secondary | ICD-10-CM

## 2023-03-06 DIAGNOSIS — E669 Obesity, unspecified: Secondary | ICD-10-CM

## 2023-03-06 DIAGNOSIS — Z7985 Long-term (current) use of injectable non-insulin antidiabetic drugs: Secondary | ICD-10-CM | POA: Diagnosis not present

## 2023-03-06 DIAGNOSIS — E1165 Type 2 diabetes mellitus with hyperglycemia: Secondary | ICD-10-CM

## 2023-03-06 DIAGNOSIS — F39 Unspecified mood [affective] disorder: Secondary | ICD-10-CM

## 2023-03-06 DIAGNOSIS — Z7984 Long term (current) use of oral hypoglycemic drugs: Secondary | ICD-10-CM

## 2023-03-06 MED ORDER — BUPROPION HCL ER (SR) 150 MG PO TB12
150.0000 mg | ORAL_TABLET | Freq: Every day | ORAL | 1 refills | Status: DC
  Filled 2023-03-06: qty 30, 30d supply, fill #0

## 2023-03-06 MED ORDER — LEVOCETIRIZINE DIHYDROCHLORIDE 5 MG PO TABS
5.0000 mg | ORAL_TABLET | Freq: Every evening | ORAL | 0 refills | Status: DC
  Filled 2023-03-06: qty 60, 60d supply, fill #0

## 2023-03-06 MED ORDER — TIRZEPATIDE 5 MG/0.5ML ~~LOC~~ SOAJ
5.0000 mg | SUBCUTANEOUS | 0 refills | Status: DC
  Filled 2023-03-06 – 2023-03-08 (×3): qty 2, 28d supply, fill #0

## 2023-03-06 NOTE — Progress Notes (Signed)
.smr  Office: [phone]  /  Fax: [phone]  WEIGHT SUMMARY AND BIOMETRICS  Vitals Temp: 98.8 F (37.1 C) BP: 114/71 Pulse Rate: 66 SpO2: 97 %   Anthropometric Measurements Height: 5\' 4"  (1.626 m) Weight: 201 lb (91.2 kg) BMI (Calculated): 34.48 Weight at Last Visit: 205 lb Weight Lost Since Last Visit: 4 lb Weight Gained Since Last Visit: 0 Starting Weight: 200 lb Total Weight Loss (lbs): 0 lb (0 kg) Peak Weight: 220 lb   Body Composition  Body Fat %: 43.5 % Fat Mass (lbs): 87.8 lbs Muscle Mass (lbs): 108.2 lbs Total Body Water (lbs): 79 lbs Visceral Fat Rating : 12   Other Clinical Data Fasting: no Labs: no Today's Visit #: 33 Starting Date: 10/27/20     HPI  Chief Complaint: OBESITY  Victoria Garcia is here to discuss her progress with her obesity treatment plan. She is on the the Category 2 Plan and states she is following her eating plan approximately 85 % of the time. She states she is exercising walking 45 minutes 3 times per week.   Interval History:  Since last office visit she is down 4 lbs.   The patient, a 62 year old female with a history of obesity and newly diagnosed type 2 diabetes, presents for a follow-up of her obesity treatment plan.  She reports a "bumpy" experience after starting Victoria Garcia 5 mg weekly, with symptoms including stomach cramping and loose stools.  The patient also reports feeling lethargic after eating certain foods, particularly in the morning. Despite these challenges, the patient has made lifestyle changes, including joining a senior center for exercise classes.  She reports some improvement in her clothing fit, consistent with her weight loss.   Pharmacotherapy: Victoria Garcia 5 mg weekly. Denies mass in neck, dysphagia, dyspepsia, persistent hoarseness, abdominal pain, or V/Constipation or diarrhea. Has annual eye exam. Mood is stable. Some nausea and noted loose stools when consistently taking metformin with Victoria Garcia. Noticed  decreased nausea when avoided morning dose of metformin. ? Mild fatigue with Victoria Garcia- monitor.   We are going to stop metformin for now as increased GI upset after starting Victoria Garcia and monitor closely.   Victoria Garcia for emotional eating/cravings- no side effects.   TREATMENT PLAN FOR OBESITY: Obesity  New diagnosis of type 2 diabetes, currently on Victoria Garcia 5mg  weekly and metformin 500mg  twice daily. Experiencing gastrointestinal side effects including cramping and loose stools, potentially related to both medications. Noted improvement in symptoms when metformin was held. -Discontinue metformin for now and monitor response. -Continue Victoria Garcia 5mg  weekly. -Check insulin levels and A1C in future to assess need for metformin.  Recommended Dietary Goals  Victoria Garcia is currently in the action stage of change. As such, her goal is to continue weight management plan. She has agreed to the Category 2 Plan.  Behavioral Intervention  We discussed the following Behavioral Modification Strategies today: continue to work on maintaining a reduced calorie state, getting the recommended amount of protein, incorporating whole foods, making healthy choices, staying well hydrated and practicing mindfulness when eating..  Additional resources provided today: NA  Recommended Physical Activity Goals  Victoria Garcia has been advised to work up to 150 minutes of moderate intensity aerobic activity a week and strengthening exercises 2-3 times per week for cardiovascular health, weight loss maintenance and preservation of muscle mass.   She has agreed to Increase physical activity in their day and reduce sedentary time (increase NEAT). and She has joined [employer] [facility] and plans to start classes- pilates and yoga and then  water aerobics in November.    Pharmacotherapy We discussed various medication options to help Victoria Garcia with her weight loss efforts and we both agreed to continue Victoria Garcia 5 mg weekly and to hold  metformin for now. She will also continue Victoria Garcia for emotional eating/cravings.    Return in about 4 weeks (around 04/03/2023).Marland Kitchen She was informed of the importance of frequent follow up visits to maximize her success with intensive lifestyle modifications for her multiple health conditions.  PHYSICAL EXAM:  Blood pressure 114/71, pulse 66, temperature 98.8 F (37.1 C), height 5\' 4"  (1.626 m), weight 201 lb (91.2 kg), SpO2 97%. Body mass index is 34.5 kg/m.  General: She is overweight, cooperative, alert, well developed, and in no acute distress. PSYCH: Has normal mood, affect and thought process.   Cardiovascular: HR 60's BP 114/71 Lungs: Normal breathing effort, no conversational dyspnea. Neuro: no focal deficits  DIAGNOSTIC DATA REVIEWED:  BMET    Component Value Date/Time   NA 140 01/09/2023 0833   K 4.3 01/09/2023 0833   CL 101 01/09/2023 0833   CO2 25 01/09/2023 0833   GLUCOSE 112 (H) 01/09/2023 0833   GLUCOSE 98 02/17/2021 0825   BUN 17 01/09/2023 0833   CREATININE 0.72 01/09/2023 0833   CREATININE 0.73 02/17/2021 0825   CALCIUM 9.3 01/09/2023 0833   GFRNONAA 96 08/17/2020 1000   GFRAA 111 08/17/2020 1000   Lab Results  Component Value Date   HGBA1C 6.6 (H) 01/09/2023   HGBA1C 6.5 (H) 05/07/2014   Lab Results  Component Value Date   INSULIN 23.6 01/09/2023   INSULIN 14.3 10/27/2020   Lab Results  Component Value Date   TSH 1.510 01/09/2023   CBC    Component Value Date/Time   WBC 6.6 01/09/2023 0833   WBC 7.0 08/17/2020 1000   RBC 4.34 01/09/2023 0833   RBC 4.48 08/17/2020 1000   HGB 12.9 01/09/2023 0833   HCT 39.9 01/09/2023 0833   PLT 211 01/09/2023 0833   MCV 92 01/09/2023 0833   MCH 29.7 01/09/2023 0833   MCH 30.1 08/17/2020 1000   MCHC 32.3 01/09/2023 0833   MCHC 33.3 08/17/2020 1000   RDW 12.6 01/09/2023 0833   Iron Studies    Component Value Date/Time   IRON 50 09/24/2014 1018   Lipid Panel     Component Value Date/Time    CHOL 148 01/09/2023 0833   TRIG 101 01/09/2023 0833   HDL 43 01/09/2023 0833   CHOLHDL 4.2 01/12/2022 1552   CHOLHDL 4.4 02/17/2021 0825   VLDL 21 01/13/2017 0924   LDLCALC 86 01/09/2023 0833   LDLCALC 105 (H) 02/17/2021 0825   LDLDIRECT 85 06/17/2021 1026   Hepatic Function Panel     Component Value Date/Time   PROT 6.9 01/09/2023 0833   ALBUMIN 4.1 01/09/2023 0833   AST 19 01/09/2023 0833   ALT 22 01/09/2023 0833   ALKPHOS 98 01/09/2023 0833   BILITOT 0.7 01/09/2023 0833      Component Value Date/Time   TSH 1.510 01/09/2023 0833   Nutritional Lab Results  Component Value Date   VD25OH 49.8 01/09/2023   VD25OH 55.7 06/13/2022   VD25OH 48.7 01/12/2022    ASSOCIATED CONDITIONS ADDRESSED TODAY  ASSESSMENT AND PLAN  Problem List Items Addressed This Visit     Mood disorder (HCC), emotional eating   Relevant Medications   buPROPion (Victoria Garcia SR) 150 MG 12 hr tablet   Generalized obesity   Relevant Medications   tirzepatide (Victoria Garcia) 5 MG/0.5ML Pen  Environmental and seasonal allergies   Relevant Medications   levocetirizine (XYZAL) 5 MG tablet   New diagnosis of Type 2 diabetes without long term current  insulin use - Primary   Relevant Medications   tirzepatide (Victoria Garcia) 5 MG/0.5ML Pen   Other Visit Diagnoses     BMI 34.0-34.9,adult Current BMI 34.6         Type 2 Diabetes Mellitus with other specified complication, without long-term current use of insulin HgbA1c is not at goal. Last A1c was 6.6  Medication(s): Victoria Garcia 5.0 mg SQ weekly and Metformin 500 mg twice daily with meals Denies mass in neck, dysphagia, dyspepsia, persistent hoarseness, abdominal pain, or V/Constipation or diarrhea. Has annual eye exam. Mood is stable. Some nausea and noted loose stools when consistently taking metformin with Victoria Garcia. Noticed decreased nausea when avoided morning dose of metformin. ? Mild fatigue with Victoria Garcia- monitor.  Lab Results  Component Value Date    HGBA1C 6.6 (H) 01/09/2023   HGBA1C 6.4 (A) 10/04/2022   HGBA1C 6.0 (H) 06/13/2022   Lab Results  Component Value Date   LDLCALC 86 01/09/2023   CREATININE 0.72 01/09/2023   No results found for: "GFR"  Plan: Continue and refill Victoria Garcia 5.0 mg SQ weekly Holding metformin for increased nausea when on both medications and will monitor closely.  Will plan to repeat labs in ~ 2 months and can reassess resumption of metformin based on insulin levels,etc at that time.  She is working  on nutrition plan to decrease simple carbohydrates, increase lean proteins and exercise to promote weight loss and improve glycemic control .  Seasonal allergies: On xyzal without side effects and good result.  Plan : Refill and continue Xyzal 5 mg nightly.    Mood disorder/emotional eating Victoria Garcia has had issues with stress/emotional eating. Currently this is moderately controlled. Overall mood is stable. Medication(s): Bupropion SR 150 mg daily in am  Plan: Continue and refill Bupropion SR 150 mg daily in am Continue to work on emotional eating behavior.  ATTESTASTION STATEMENTS:  Reviewed by clinician on day of visit: allergies, medications, problem list, medical history, surgical history, family history, social history, and previous encounter notes.   I have personally spent 32 minutes total time today in preparation, patient care, nutritional counseling and documentation for this visit, including the following: review of clinical lab tests; review of medical tests/procedures/services.      Tanay Massiah, PA-C

## 2023-03-07 ENCOUNTER — Other Ambulatory Visit (HOSPITAL_COMMUNITY): Payer: Self-pay

## 2023-03-08 ENCOUNTER — Other Ambulatory Visit (HOSPITAL_COMMUNITY): Payer: Self-pay

## 2023-04-06 ENCOUNTER — Ambulatory Visit (INDEPENDENT_AMBULATORY_CARE_PROVIDER_SITE_OTHER): Payer: Commercial Managed Care - PPO | Admitting: Physician Assistant

## 2023-04-06 ENCOUNTER — Other Ambulatory Visit (HOSPITAL_COMMUNITY): Payer: Self-pay

## 2023-04-06 ENCOUNTER — Encounter (INDEPENDENT_AMBULATORY_CARE_PROVIDER_SITE_OTHER): Payer: Self-pay | Admitting: Physician Assistant

## 2023-04-06 VITALS — BP 107/70 | HR 76 | Temp 98.6°F | Ht 64.0 in | Wt 198.0 lb

## 2023-04-06 DIAGNOSIS — Z7984 Long term (current) use of oral hypoglycemic drugs: Secondary | ICD-10-CM

## 2023-04-06 DIAGNOSIS — E1169 Type 2 diabetes mellitus with other specified complication: Secondary | ICD-10-CM

## 2023-04-06 DIAGNOSIS — J3089 Other allergic rhinitis: Secondary | ICD-10-CM | POA: Diagnosis not present

## 2023-04-06 DIAGNOSIS — E669 Obesity, unspecified: Secondary | ICD-10-CM | POA: Diagnosis not present

## 2023-04-06 DIAGNOSIS — E1165 Type 2 diabetes mellitus with hyperglycemia: Secondary | ICD-10-CM

## 2023-04-06 DIAGNOSIS — E785 Hyperlipidemia, unspecified: Secondary | ICD-10-CM | POA: Diagnosis not present

## 2023-04-06 DIAGNOSIS — F39 Unspecified mood [affective] disorder: Secondary | ICD-10-CM | POA: Diagnosis not present

## 2023-04-06 DIAGNOSIS — Z7985 Long-term (current) use of injectable non-insulin antidiabetic drugs: Secondary | ICD-10-CM

## 2023-04-06 DIAGNOSIS — Z6833 Body mass index (BMI) 33.0-33.9, adult: Secondary | ICD-10-CM

## 2023-04-06 MED ORDER — TIRZEPATIDE 7.5 MG/0.5ML ~~LOC~~ SOAJ
7.5000 mg | SUBCUTANEOUS | 0 refills | Status: DC
  Filled 2023-04-06: qty 2, 28d supply, fill #0

## 2023-04-06 MED ORDER — BUPROPION HCL ER (SR) 150 MG PO TB12
150.0000 mg | ORAL_TABLET | Freq: Every day | ORAL | 1 refills | Status: DC
  Filled 2023-04-06: qty 30, 30d supply, fill #0

## 2023-04-06 MED ORDER — LEVOCETIRIZINE DIHYDROCHLORIDE 5 MG PO TABS
5.0000 mg | ORAL_TABLET | Freq: Every evening | ORAL | 0 refills | Status: DC
  Filled 2023-04-06 (×2): qty 60, 60d supply, fill #0

## 2023-04-06 MED ORDER — METFORMIN HCL 500 MG PO TABS
500.0000 mg | ORAL_TABLET | Freq: Every day | ORAL | 0 refills | Status: DC
  Filled 2023-04-06: qty 30, 30d supply, fill #0

## 2023-04-06 NOTE — Progress Notes (Signed)
SUBJECTIVE:  Chief Complaint: Obesity  Interim History: Victoria Garcia is a 62 year old  who presents for a follow-up visit regarding her obesity treatment plan. She has a recent diagnosis of type 2 diabetes and emotional eating behaviors, for which she is currently on Mounjaro 5 mg weekly and metformin 500 mg once daily.  She is also on Wellbutrin 150 mg daily for emotional eating behaviors and feels this is helpful.  Weight loss of three pounds since the last visit> Bio impedence scale reviewed with the patient:  Muscle mass - 0.8 lbs Adipose mass - 2.2 lbs  She has adjusted the timing of her Wellbutrin and metformin to the afternoon, which has helped manage hunger pangs that typically occur in the early afternoon. She has also stopped taking metformin in the morning, which has improved her morning symptoms. Towards the end of the week, she notices an increase in hunger, which may suggest a need to increase the dosage of her Mounjaro at this time. Nausea has improved .     The patient denies any bowel irregularities, difficulty swallowing, vision changes, or mood changes. She reports taking fiber daily and occasionally using a small dose of Miralax to maintain regular bowel movements. She also reports an improvement in fatigue since the last visit.We discussed monitoring bowel function and strategies to minimize nausea with increased dose of Mounjaro.   In terms of lifestyle, the patient has been attending Pilates classes several times a week and has been making an effort to stay hydrated. She reports sleeping well and has found the exercise classes to be uplifting and beneficial for stress reduction. She also reports a good support system within the exercise class community.  Victoria Garcia is here to discuss her progress with her obesity treatment plan. She is on the Category 2 Plan with breakfast and lunch options and states she is following her eating plan approximately 80 % of the time. She states  she is exercising pilates 45 minutes 3 times per week.   OBJECTIVE: Visit Diagnoses: Problem List Items Addressed This Visit     Mood disorder (HCC), emotional eating   Relevant Medications   buPROPion (WELLBUTRIN SR) 150 MG 12 hr tablet   Generalized obesity   Relevant Medications   metFORMIN (GLUCOPHAGE) 500 MG tablet   tirzepatide (MOUNJARO) 7.5 MG/0.5ML Pen   Environmental and seasonal allergies   Relevant Medications   levocetirizine (XYZAL) 5 MG tablet   New diagnosis of Type 2 diabetes without long term current  insulin use - Primary   Relevant Medications   metFORMIN (GLUCOPHAGE) 500 MG tablet   tirzepatide (MOUNJARO) 7.5 MG/0.5ML Pen   Other Visit Diagnoses     Hyperlipidemia associated with type 2 diabetes mellitus (HCC)       Relevant Medications   metFORMIN (GLUCOPHAGE) 500 MG tablet   tirzepatide (MOUNJARO) 7.5 MG/0.5ML Pen     Obesity On Mounjaro 5 mg weekly, metformin 500 mg bid, and Wellbutrin 150 mg daily for emotional eating. Weight loss of three pounds. Noted hunger towards the end of the dose interval, particularly on Fridays and Saturdays. Discussed increasing Mounjaro to 7.5 mg   Risks include potential nausea and fatigue, which should improve over time. Emphasized hydration, avoiding fatty meals, and maintaining protein intake to mitigate side effects. - Increase/Refill Mounjaro to 7.5 mg weekly - Continue/refill metformin 500 mg once daily.  - Continue/refill Wellbutrin 150 mg daily - Ensure adequate hydration - Avoid skipping meals and eat protein-based snacks - Avoid fatty meals, especially  after the injection - Refill Xyzal  Type 2 Diabetes Mellitus She is working  on nutrition plan to decrease simple carbohydrates, increase lean proteins and exercise to promote weight loss and improve glycemic control .  Lab Results  Component Value Date   HGBA1C 6.6 (H) 01/09/2023   HGBA1C 6.4 (A) 10/04/2022   HGBA1C 6.0 (H) 06/13/2022   Lab Results   Component Value Date   LDLCALC 86 01/09/2023   CREATININE 0.72 01/09/2023    Managing type 2 diabetes with metformin and Mounjaro. Tolerating medications well without significant side effects.  Plan:  - Continue/refill metformin 500 mg bid - Increase/refill Mounjaro 7.5 mg weekly Continue working  on nutrition plan to decrease simple carbohydrates, increase lean proteins and exercise to promote weight loss and improve glycemic control .   Emotional Eating Behaviors On Wellbutrin 150 mg daily to manage emotional eating behaviors. No side effects reported. Medication is helpful in managing eating habits. - Continue/refill Wellbutrin 150 mg daily  Seasonal allergies Reports seasonal allergies which respond well to Xyzal  Plan: Refill/continue Xyzal 5 mg daily.  Monitor for response  General Health Maintenance Engaging in regular physical activity, including Pilates several days a week, beneficial for strength, balance, and flexibility. Planning to swim laps starting in January. Provided strategies for managing diet during the holiday season, emphasizing portion control and mindful eating. - Continue Pilates  - Follow dietary strategies for Thanksgiving, including portion control and mindful eating - Maintain regular physical activity for strength, balance, and flexibility  Follow-up - Has Follow up in December with primary care for lab work - Message if any issues arise with the increased dose of Mounjaro.  Vitals Temp: 98.6 F (37 C) BP: 107/70 Pulse Rate: 76 SpO2: 96 %   Anthropometric Measurements Height: 5\' 4"  (1.626 m) Weight: 198 lb (89.8 kg) BMI (Calculated): 33.97 Weight at Last Visit: 201 lb Weight Lost Since Last Visit: 3 lb Weight Gained Since Last Visit: 0 Starting Weight: 200 lb Total Weight Loss (lbs): 2 lb (0.907 kg) Peak Weight: 220 lb   Body Composition  Body Fat %: 43.1 % Fat Mass (lbs): 85.6 lbs Muscle Mass (lbs): 107 lbs Total Body Water  (lbs): 79.4 lbs Visceral Fat Rating : 12   Other Clinical Data Fasting: no Labs: no Today's Visit #: 34 Starting Date: 10/27/20  General: She is overweight, cooperative, alert, well developed, and in no acute distress. PSYCH: Has normal mood, affect and thought process.   Cardiovascular: HR 70's BP 107/70 Lungs: Normal breathing effort, no conversational dyspnea. Neuro: no focal deficits   ASSESSMENT AND PLAN:  Diet: Clarene is currently in the action stage of change. As such, her goal is to continue with weight loss efforts. She has agreed to Category 2 Plan.  Exercise: Yeilani has been instructed to continue exercising as is for weight loss and overall health benefits.   Behavior Modification:  We discussed the following Behavioral Modification Strategies today: increasing lean protein intake, decreasing simple carbohydrates, increasing vegetables, increase H2O intake, increase high fiber foods, no skipping meals, emotional eating strategies , and holiday eating strategies. We discussed various medication options to help Maecee with her weight loss efforts and we both agreed to increase Mounjaro to 7.5 mg weekly for management of Type 2 diabetes and continue Metformin 500 mg daily and Wellbutrin 150 mg daily for emotional eating. .  Return in about 4 weeks (around 05/04/2023).Marland Kitchen She was informed of the importance of frequent follow up visits to maximize her  success with intensive lifestyle modifications for her multiple health conditions.  Attestation Statements:   Reviewed by clinician on day of visit: allergies, medications, problem list, medical history, surgical history, family history, social history, and previous encounter notes.   Time spent on visit including pre-visit chart review and post-visit care and charting was 35 minutes.    Shanteria Laye, PA-C

## 2023-04-07 ENCOUNTER — Other Ambulatory Visit: Payer: Self-pay

## 2023-04-10 ENCOUNTER — Other Ambulatory Visit (HOSPITAL_COMMUNITY): Payer: Self-pay

## 2023-04-11 ENCOUNTER — Other Ambulatory Visit (HOSPITAL_COMMUNITY): Payer: Self-pay

## 2023-05-08 ENCOUNTER — Ambulatory Visit (INDEPENDENT_AMBULATORY_CARE_PROVIDER_SITE_OTHER): Payer: Commercial Managed Care - PPO | Admitting: Student

## 2023-05-08 VITALS — BP 113/65 | HR 86 | Temp 98.6°F | Ht 64.0 in | Wt 198.5 lb

## 2023-05-08 DIAGNOSIS — M25561 Pain in right knee: Secondary | ICD-10-CM

## 2023-05-08 DIAGNOSIS — Z7984 Long term (current) use of oral hypoglycemic drugs: Secondary | ICD-10-CM | POA: Diagnosis not present

## 2023-05-08 DIAGNOSIS — Z0000 Encounter for general adult medical examination without abnormal findings: Secondary | ICD-10-CM | POA: Insufficient documentation

## 2023-05-08 DIAGNOSIS — Z7985 Long-term (current) use of injectable non-insulin antidiabetic drugs: Secondary | ICD-10-CM | POA: Diagnosis not present

## 2023-05-08 DIAGNOSIS — G8929 Other chronic pain: Secondary | ICD-10-CM

## 2023-05-08 DIAGNOSIS — M25562 Pain in left knee: Secondary | ICD-10-CM | POA: Diagnosis not present

## 2023-05-08 DIAGNOSIS — M79652 Pain in left thigh: Secondary | ICD-10-CM | POA: Insufficient documentation

## 2023-05-08 DIAGNOSIS — E119 Type 2 diabetes mellitus without complications: Secondary | ICD-10-CM

## 2023-05-08 DIAGNOSIS — E041 Nontoxic single thyroid nodule: Secondary | ICD-10-CM

## 2023-05-08 LAB — POCT GLYCOSYLATED HEMOGLOBIN (HGB A1C): Hemoglobin A1C: 5.8 % — AB (ref 4.0–5.6)

## 2023-05-08 LAB — GLUCOSE, CAPILLARY: Glucose-Capillary: 73 mg/dL (ref 70–99)

## 2023-05-08 NOTE — Assessment & Plan Note (Signed)
Thyroid u/s from last visit showed mildly enlarged but otherwise unremarkable thyroid gland. Remains asymptomatic. Last TSH was normal. Can consider repeat TSH in 6 months to evaluate for any changes or develops symptoms.

## 2023-05-08 NOTE — Assessment & Plan Note (Signed)
-  Reports she received flu shot this season at [facility]  -Reports she received COVID booster this year

## 2023-05-08 NOTE — Assessment & Plan Note (Signed)
Noted improvement to bilateral knee pain after losing weight with current diabetes regimen. No worsening at this time. Has voltaren gel at home to use.

## 2023-05-08 NOTE — Patient Instructions (Signed)
Thank you, Ms.Laporscha R Sheilds for allowing [name] to provide your care today. Today we discussed diabetes, left thigh pain and thyroid results.  -Diabetes:  -Rechecking your A1c today -Continue taking your metformin and Mounjaro -I am glad to hear your knee pains have improved with the weight loss  -Continue with yoga and pilates. -If you decide you are interested in Physical Therapy, please let me know.  -Thyroid ultrasound was overall normal and just mildly enlarged. I am reassured since you have no symptoms and your last TSH level was normal.  -Urine sample to check for proteins from kidney.     I have ordered the following labs for you:  Lab Orders         Microalbumin / Creatinine Urine Ratio         POC Hbg A1C       Follow up: 3-4 months   Should you have any questions or concerns please call the internal medicine clinic at [phone].    Rana Snare, D.O. [facility]

## 2023-05-08 NOTE — Progress Notes (Signed)
CC: routine f/u  HPI:  [name] is a 62 y.o. female living with a history stated below and presents today for routine f/u. Please see problem based assessment and plan for additional details.  Past Medical History:  Diagnosis Date   Allergy    SEASONAL   Anemia, iron deficiency 09/24/2014   Back pain    Depression    History of stomach ulcers    Hyperlipidemia    Joint pain    Multiple food allergies    Prediabetes    Vitamin D deficiency     Current Outpatient Medications on File Prior to Visit  Medication Sig Dispense Refill   atorvastatin (LIPITOR) 40 MG tablet Take 1 tablet (40 mg total) by mouth at bedtime. 90 tablet 3   b complex vitamins capsule Take 1 capsule by mouth daily.     buPROPion (WELLBUTRIN SR) 150 MG 12 hr tablet Take 1 tablet (150 mg total) by mouth daily. 30 tablet 1   Cholecalciferol (VITAMIN D) 2000 units tablet Take 1 tablet (2,000 Units total) by mouth daily.     diclofenac Sodium (VOLTAREN ARTHRITIS PAIN) 1 % GEL Apply 2 g topically 4 (four) times daily. 100 g 2   levocetirizine (XYZAL) 5 MG tablet Take 1 tablet (5 mg total) by mouth every evening. 60 tablet 0   metFORMIN (GLUCOPHAGE) 500 MG tablet Take 1 tablet (500 mg total) by mouth daily. 30 tablet 0   Multiple Vitamin (MULTIVITAMIN WITH MINERALS) TABS tablet Take 1 tablet by mouth daily.     tirzepatide (MOUNJARO) 7.5 MG/0.5ML Pen Inject 7.5 mg into the skin once a week. 2 mL 0   No current facility-administered medications on file prior to visit.    Family History  Problem Relation Age of Onset   Cancer Mother 67       Squamous cell Primary unknown   Hypertension Mother    Hyperlipidemia Mother    Thyroid disease Mother    Cancer Father        Prostate cancer   Diabetes Father    Thyroid disease Maternal Aunt    Cancer Maternal Aunt        All mother siblings died from cancer   Thyroid disease Maternal Uncle    Cancer Maternal Uncle    Thyroid disease Maternal  Grandmother    Cancer Maternal Grandmother        STOMACH CANCER   Cancer Maternal Grandfather        Prostate cancer    Social History   Socioeconomic History   Marital status: Married    Spouse name: Charmell Araiza   Number of children: 2   Years of education: Not on file   Highest education level: Not on file  Occupational History   Occupation: Cardiovascular Specialist  Tobacco Use   Smoking status: Never   Smokeless tobacco: Never  Vaping Use   Vaping status: Never Used  Substance and Sexual Activity   Alcohol use: Yes    Comment: few drinks per month   Drug use: No   Sexual activity: Yes    Birth control/protection: Surgical  Other Topics Concern   Not on file  Social History Narrative   Married, has 2 children, boy and girl.  Works at cardiac cath lab, cardiovascular specialist, degree in radiology.  Exercise - walks at least a mile daily.    12/2021.   Social Drivers of Corporate investment banker Strain: Not on file  Food Insecurity:  Not on file  Transportation Needs: Not on file  Physical Activity: Not on file  Stress: Not on file  Social Connections: Not on file  Intimate Partner Violence: Not on file    Review of Systems: ROS negative except for what is noted on the assessment and plan.  Vitals:   05/08/23 1550  BP: 113/65  Pulse: 86  Temp: 98.6 F (37 C)  TempSrc: Oral  SpO2: 100%  Weight: 198 lb 8 oz (90 kg)  Height: 5\' 4"  (1.626 m)   Physical Exam: Constitutional: well-appearing female sitting in chair comfortably, in no acute distress Cardiovascular: regular rate Pulmonary/Chest: normal work of breathing on room air MSK: gait intact, normal pain-free ROM of L hip and knee, no open wounds on bilateral feet with palpable 2+ DP pulses  Neurological: alert & oriented x 3 Psych: normal mood and affect  Assessment & Plan:   Type 2 diabetes mellitus without complication, without long-term current use of insulin (HCC) Last A1c was 6.6 in  August, which improved to 5.8. Taking metformin 500 mg daily and Mounjaro 7.5 mg weekly. Tolerating well and reports good adherence. Still follows with [facility]. States doing yoga and pilates. Noted weight trending down and improvement in how she feels.  Foot exam completed today.   Plan -Continue metformin 500 mg BID and Mounjaro 7.5 mg -Urine microalbumin today  -Patient to f/u with ophthalmology appt -F/u in 3 months for A1c recheck   Left thigh pain Reports ongoing left lateral hip and thigh pain that at times extend to knee. Described as throbbing/aching and tingling. Pain mainly when laying on left side in bed. Relieved with Advil. Does not affect her functional status. Ambulating well without assistance. Exam did not show any acute concerns. Suspect MSK and maybe nerve related. Discussed PT but patient deferred and will continue healthy lifestyle and exercise.   Knee pain, bilateral Noted improvement to bilateral knee pain after losing weight with current diabetes regimen. No worsening at this time. Has voltaren gel at home to use.   Thyroid nodule Thyroid u/s from last visit showed mildly enlarged but otherwise unremarkable thyroid gland. Remains asymptomatic. Last TSH was normal. Can consider repeat TSH in 6 months to evaluate for any changes or develops symptoms.   Healthcare maintenance -Reports she received flu shot this season at [facility]  -Reports she received COVID booster this year    Patient discussed with Dr. Halina Andreas, D.O. [facility], PGY-2 Phone: [phone] Date 05/08/2023 Time 6:48 PM

## 2023-05-08 NOTE — Assessment & Plan Note (Addendum)
Last A1c was 6.6 in August, which improved to 5.8. Taking metformin 500 mg daily and Mounjaro 7.5 mg weekly. Tolerating well and reports good adherence. Still follows with [facility]. States doing yoga and pilates. Noted weight trending down and improvement in how she feels.  Foot exam completed today.   Plan -Continue metformin 500 mg BID and Mounjaro 7.5 mg -Urine microalbumin today  -Patient to f/u with ophthalmology appt -F/u in 3 months for A1c recheck

## 2023-05-08 NOTE — Assessment & Plan Note (Signed)
Reports ongoing left lateral hip and thigh pain that at times extend to knee. Described as throbbing/aching and tingling. Pain mainly when laying on left side in bed. Relieved with Advil. Does not affect her functional status. Ambulating well without assistance. Exam did not show any acute concerns. Suspect MSK and maybe nerve related. Discussed PT but patient deferred and will continue healthy lifestyle and exercise.

## 2023-05-09 ENCOUNTER — Encounter: Payer: Self-pay | Admitting: Student

## 2023-05-09 LAB — MICROALBUMIN / CREATININE URINE RATIO
Creatinine, Urine: 117.2 mg/dL
Microalb/Creat Ratio: 12 mg/g{creat} (ref 0–29)
Microalbumin, Urine: 14.1 ug/mL

## 2023-05-09 NOTE — Progress Notes (Signed)
Internal Medicine Clinic Attending  Case discussed with the resident at the time of the visit.  We reviewed the resident's history and exam and pertinent patient test results.  I agree with the assessment, diagnosis, and plan of care documented in the resident's note.  

## 2023-05-10 ENCOUNTER — Encounter (INDEPENDENT_AMBULATORY_CARE_PROVIDER_SITE_OTHER): Payer: Self-pay | Admitting: Physician Assistant

## 2023-05-10 ENCOUNTER — Ambulatory Visit (INDEPENDENT_AMBULATORY_CARE_PROVIDER_SITE_OTHER): Payer: Commercial Managed Care - PPO | Admitting: Physician Assistant

## 2023-05-10 ENCOUNTER — Other Ambulatory Visit (HOSPITAL_COMMUNITY): Payer: Self-pay

## 2023-05-10 VITALS — BP 108/72 | HR 96 | Temp 98.2°F | Ht 64.0 in | Wt 195.0 lb

## 2023-05-10 DIAGNOSIS — Z6833 Body mass index (BMI) 33.0-33.9, adult: Secondary | ICD-10-CM | POA: Diagnosis not present

## 2023-05-10 DIAGNOSIS — J3089 Other allergic rhinitis: Secondary | ICD-10-CM | POA: Diagnosis not present

## 2023-05-10 DIAGNOSIS — E669 Obesity, unspecified: Secondary | ICD-10-CM

## 2023-05-10 DIAGNOSIS — Z7984 Long term (current) use of oral hypoglycemic drugs: Secondary | ICD-10-CM | POA: Diagnosis not present

## 2023-05-10 DIAGNOSIS — E1165 Type 2 diabetes mellitus with hyperglycemia: Secondary | ICD-10-CM

## 2023-05-10 DIAGNOSIS — F39 Unspecified mood [affective] disorder: Secondary | ICD-10-CM

## 2023-05-10 DIAGNOSIS — F5089 Other specified eating disorder: Secondary | ICD-10-CM | POA: Diagnosis not present

## 2023-05-10 DIAGNOSIS — E119 Type 2 diabetes mellitus without complications: Secondary | ICD-10-CM | POA: Diagnosis not present

## 2023-05-10 DIAGNOSIS — Z7985 Long-term (current) use of injectable non-insulin antidiabetic drugs: Secondary | ICD-10-CM

## 2023-05-10 MED ORDER — METFORMIN HCL 500 MG PO TABS
500.0000 mg | ORAL_TABLET | Freq: Every day | ORAL | 0 refills | Status: DC
  Filled 2023-05-10: qty 30, 30d supply, fill #0

## 2023-05-10 MED ORDER — BUPROPION HCL ER (SR) 150 MG PO TB12
150.0000 mg | ORAL_TABLET | Freq: Every day | ORAL | 1 refills | Status: DC
  Filled 2023-05-10: qty 30, 30d supply, fill #0

## 2023-05-10 MED ORDER — LEVOCETIRIZINE DIHYDROCHLORIDE 5 MG PO TABS
5.0000 mg | ORAL_TABLET | Freq: Every evening | ORAL | 0 refills | Status: DC
  Filled 2023-05-10: qty 60, 60d supply, fill #0

## 2023-05-10 MED ORDER — TIRZEPATIDE 7.5 MG/0.5ML ~~LOC~~ SOAJ
7.5000 mg | SUBCUTANEOUS | 0 refills | Status: DC
  Filled 2023-05-10: qty 2, 28d supply, fill #0

## 2023-05-10 NOTE — Progress Notes (Unsigned)
SUBJECTIVE:  Chief Complaint: Obesity Discussed the use of AI scribe software for clinical note transcription with the patient, who gave verbal consent to proceed.  History of Present Illness     Interim History: Victoria Garcia is down 3 lbs since her last visit.   Victoria Garcia, a 62 year old individual with a history of type 2 diabetes diagnosed earlier this year, presents for a follow-up visit regarding her obesity treatment plan. She has been on [facility] for several months now . She reports no significant issues with the medication and feels it is effectively controlling her appetite and suppressing hunger. She has not experienced excessive cravings or any significant side effects. However, she does report occasional constipation, which she manages with daily fiber intake and occasional use of MiraLAX, particularly when her diet includes more cheese than usual.  She also reports a sensation in her hip that radiates down the side of her leg at night, particularly after a long, hard day at work. This sensation does not occur during her workday. She has found relief from this discomfort through yoga and stretching exercises. She has also noticed that overeating or consuming sweet or high-fat foods makes her feel lethargic and groggy, which has led her to reduce her intake of such foods.  In addition to her diabetes and obesity, she has a history of an enlarged thyroid, which was recently evaluated by her primary care provider. An ultrasound of the thyroid showed no concerning nodules or other abnormalities. She also reports knee discomfort, which has been managed with Voltaren gel and shoe inserts. Her blood pressure at this visit was 108/72, and her most recent HbA1c was significantly improved- down to 5.8%, suggesting good control of her diabetes.  Victoria Garcia is here to discuss her progress with her obesity treatment plan. She is on the Category 2 Plan with breakfast and lunch options and states she is  following her eating plan approximately 75 % of the time. She states she is exercising, walking 45 minutes 3-4 times per week.  OBJECTIVE: Visit Diagnoses: Problem List Items Addressed This Visit     Mood disorder (HCC), emotional eating   Relevant Medications   buPROPion (WELLBUTRIN SR) 150 MG 12 hr tablet   Environmental and seasonal allergies   Relevant Medications   levocetirizine (XYZAL) 5 MG tablet   Other Visit Diagnoses       New diagnosis of Type 2 diabetes without long term current  insulin use    -  Primary   Relevant Medications   metFORMIN (GLUCOPHAGE) 500 MG tablet   tirzepatide (MOUNJARO) 7.5 MG/0.5ML Pen     BMI 33.0-33.9,adult, current BMI 33.6         Generalized obesity- Start BMI 34.33       Relevant Medications   metFORMIN (GLUCOPHAGE) 500 MG tablet   tirzepatide (MOUNJARO) 7.5 MG/0.5ML Pen     Obesity Follow-up for obesity management. On Mounjaro 7.5 mg with good results in hunger suppression and appetite control. No significant side effects reported. Lost weight over Thanksgiving and concerned about maintaining progress during the upcoming holiday week. Discussed the importance of not feeling deprived during holidays but maintaining control over sweet snack intake. - Continue Mounjaro 7.5 mg - Encourage exercise during the holiday week - Monitor dietary intake, especially sweet snacks - Follow up in January to reassess progress  Type 2 Diabetes Mellitus Lab Results  Component Value Date   HGBA1C 5.8 (A) 05/08/2023   HGBA1C 6.6 (H) 01/09/2023   HGBA1C 6.4 (A)  10/04/2022   Lab Results  Component Value Date   LDLCALC 86 01/09/2023   CREATININE 0.72 01/09/2023    Diagnosed earlier this year. Managed with Mounjaro and metformin. Recent A1c check showed significant improvement, likely to be out of prediabetic range soon. Discussed rechecking A1c in the new year to ensure continued progress. - Continue /refill metformin 500 mg daily Continue/refill  Mounjaro 7.5 mg weekly - Recheck A1c in the new year, at least 12 weeks from the last test She is working  on nutrition plan to decrease simple carbohydrates, increase lean proteins and exercise to promote weight loss and improve glycemic control .  Emotional eating/cravings.  Managed with bupropion (Wellbutrin). Reports it is helping and no issues with sleep. No significant side effects reported. - Continue /refill bupropion 150 mg daily(Wellbutrin)  Seasonal Allergies Managed with Xyzal. Reports occasional flare-ups but overall improvement. No significant side effects reported. - Continue/refill Xyzal at bedtime  General Health Maintenance Blood pressure well-controlled at 108/72. Thyroid scan normal despite previous concerns of enlargement. Knee pain managed with Voltaren gel and shoe inserts. Hip pain likely due to degenerative disc disease, improved with yoga and stretching. Discussed the benefits of yoga and stretching exercises for hip pain relief. - Continue Voltaren gel for knee pain - Continue yoga and stretching exercises for hip pain - Monitor hip pain and consider further investigation if symptoms worsen  Follow-up - Follow up on Wednesday, January 8th at 4:30 PM - Follow up on Wednesday, February 5th at 4:00 PM.  Vitals Temp: 98.2 F (36.8 C) BP: 108/72 Pulse Rate: 96 SpO2: 99 %   Anthropometric Measurements Height: 5\' 4"  (1.626 m) Weight: 195 lb (88.5 kg) BMI (Calculated): 33.46 Weight at Last Visit: 198 lb Weight Lost Since Last Visit: 3 lb Weight Gained Since Last Visit: 0 Starting Weight: 200 lb Total Weight Loss (lbs): 5 lb (2.268 kg) Peak Weight: 220 lb   Body Composition  Body Fat %: 42.9 % Fat Mass (lbs): 84 lbs Muscle Mass (lbs): 106 lbs Total Body Water (lbs): 80 lbs Visceral Fat Rating : 12   Other Clinical Data Fasting: no Labs: no Today's Visit #: 35 Starting Date: 10/27/20     ASSESSMENT AND PLAN:  Diet: Victoria Garcia is currently in  the action stage of change. As such, her goal is to continue with weight loss efforts. She has agreed to Category 2 Plan.  Exercise: Victoria Garcia has been instructed to work up to a goal of 150 minutes of combined cardio and strengthening exercise per week for weight loss and overall health benefits.   Behavior Modification:  We discussed the following Behavioral Modification Strategies today: increasing lean protein intake, decreasing simple carbohydrates, increasing vegetables, increase H2O intake, increase high fiber foods, no skipping meals, meal planning and cooking strategies, emotional eating strategies , holiday eating strategies, and planning for success. We discussed various medication options to help Victoria Garcia with her weight loss efforts and we both agreed to continue metformin and Mounjaro for Type 2 diabetes management.  No follow-ups on file.Marland Kitchen She was informed of the importance of frequent follow up visits to maximize her success with intensive lifestyle modifications for her multiple health conditions.  Attestation Statements:   Reviewed by clinician on day of visit: allergies, medications, problem list, medical history, surgical history, family history, social history, and previous encounter notes.   Time spent on visit including pre-visit chart review and post-visit care and charting was 33 minutes.    Raymund Manrique, PA-C

## 2023-05-31 ENCOUNTER — Other Ambulatory Visit (HOSPITAL_COMMUNITY): Payer: Self-pay

## 2023-05-31 ENCOUNTER — Ambulatory Visit (INDEPENDENT_AMBULATORY_CARE_PROVIDER_SITE_OTHER): Payer: 59 | Admitting: Physician Assistant

## 2023-05-31 ENCOUNTER — Encounter (INDEPENDENT_AMBULATORY_CARE_PROVIDER_SITE_OTHER): Payer: Self-pay | Admitting: Physician Assistant

## 2023-05-31 VITALS — BP 109/68 | HR 94 | Temp 98.5°F | Ht 64.0 in | Wt 193.0 lb

## 2023-05-31 DIAGNOSIS — E559 Vitamin D deficiency, unspecified: Secondary | ICD-10-CM

## 2023-05-31 DIAGNOSIS — E66811 Obesity, class 1: Secondary | ICD-10-CM

## 2023-05-31 DIAGNOSIS — Z7984 Long term (current) use of oral hypoglycemic drugs: Secondary | ICD-10-CM | POA: Diagnosis not present

## 2023-05-31 DIAGNOSIS — E1165 Type 2 diabetes mellitus with hyperglycemia: Secondary | ICD-10-CM

## 2023-05-31 DIAGNOSIS — E669 Obesity, unspecified: Secondary | ICD-10-CM | POA: Diagnosis not present

## 2023-05-31 DIAGNOSIS — Z7985 Long-term (current) use of injectable non-insulin antidiabetic drugs: Secondary | ICD-10-CM | POA: Diagnosis not present

## 2023-05-31 DIAGNOSIS — F5089 Other specified eating disorder: Secondary | ICD-10-CM | POA: Diagnosis not present

## 2023-05-31 DIAGNOSIS — F39 Unspecified mood [affective] disorder: Secondary | ICD-10-CM

## 2023-05-31 DIAGNOSIS — E1169 Type 2 diabetes mellitus with other specified complication: Secondary | ICD-10-CM

## 2023-05-31 DIAGNOSIS — J3089 Other allergic rhinitis: Secondary | ICD-10-CM

## 2023-05-31 MED ORDER — METFORMIN HCL 500 MG PO TABS
500.0000 mg | ORAL_TABLET | Freq: Every day | ORAL | 0 refills | Status: DC
  Filled 2023-05-31 – 2023-06-05 (×3): qty 30, 30d supply, fill #0

## 2023-05-31 MED ORDER — TIRZEPATIDE 7.5 MG/0.5ML ~~LOC~~ SOAJ
7.5000 mg | SUBCUTANEOUS | 0 refills | Status: DC
  Filled 2023-05-31 – 2023-06-01 (×2): qty 2, 28d supply, fill #0

## 2023-05-31 MED ORDER — BUPROPION HCL ER (SR) 150 MG PO TB12
150.0000 mg | ORAL_TABLET | Freq: Every day | ORAL | 1 refills | Status: DC
  Filled 2023-05-31 – 2023-06-05 (×3): qty 30, 30d supply, fill #0

## 2023-05-31 NOTE — Progress Notes (Signed)
 SUBJECTIVE: Discussed the use of AI scribe software for clinical note transcription with the patient, who gave verbal consent to proceed.  Chief Complaint: Obesity  Interim History: She is down 2 lbs since her last visit.  Bio impedence scale reviewed with the patient:  Muscle mass + 0.8 lbs Adipose mass - 3.2 lbs Total body water - 2.2 lbs  Victoria Garcia is a 63 year old female with recent diagnosis of type 2 diabetes, presents for a follow-up visit regarding her obesity treatment plan. She has been on Mounjaro  7.5 mg weekly with no reported side effects such as nausea, vomiting, diarrhea, constipation, difficulty swallowing, vision changes, or mood changes. She has also been participating in a program at [employer] for exercise, which she finds beneficial.  Weight loss of two pounds, even through the holiday season, and an increase in muscle mass. She has been trying to increase her water intake and maintain physical activity. She also reports taking Metformin  and Wellbutrin  without any issues.  She expresses concern about maintaining her weight loss and is considering journaling to track her progress. She has been taking Metamucil cookies periodically to increase her fiber intake. She has no new complaints or concerns at this time.  Victoria Garcia is here to discuss her progress with her obesity treatment plan. She is on the Category 2 Plan and states she is following her eating plan approximately 80 % of the time. She states she is exercising [employer]/walking 45 minutes 3 times per week.   OBJECTIVE: Visit Diagnoses: Problem List Items Addressed This Visit     Mood disorder (HCC), emotional eating   Relevant Medications   buPROPion  (WELLBUTRIN  SR) 150 MG 12 hr tablet   Obesity (BMI 30.0-34.9)   Other Visit Diagnoses       New diagnosis of Type 2 diabetes without long term current  insulin  use    -  Primary   Relevant Medications   metFORMIN  (GLUCOPHAGE )  500 MG tablet   tirzepatide  (MOUNJARO ) 7.5 MG/0.5ML Pen     Generalized obesity- Start BMI 34.33       Relevant Medications   metFORMIN  (GLUCOPHAGE ) 500 MG tablet   tirzepatide  (MOUNJARO ) 7.5 MG/0.5ML Pen     Obesity 63 year old actively participating in a weight loss program, showing progress with a 3.2-pound loss and  0.8 lb muscle gain. Compliant with exercise and dietary modifications. No side effects from Mounjaro  7.5 mg. Emphasized maintaining caloric intake (1200-1300 calories/day), protein (85g/day), and fiber (25g/day). - Continue current exercise regimen and dietary modifications - Encourage dietary journaling - Maintain caloric intake (1200-1300 calories/day) - Ensure daily intake of 85g protein and 25g fiber - Continue psyllium husk or Metamucil for fiber  Type 2 Diabetes Mellitus Lab Results  Component Value Date   HGBA1C 5.8 (A) 05/08/2023   HGBA1C 6.6 (H) 01/09/2023   HGBA1C 6.4 (A) 10/04/2022   Lab Results  Component Value Date   LDLCALC 86 01/09/2023   CREATININE 0.72 01/09/2023    Recently diagnosed, on Mounjaro  7.5 mg with no side effects. Denies mass in neck, dysphagia, dyspepsia, persistent hoarseness, abdominal pain, or N/V/Constipation or diarrhea. Has annual eye exam. Mood is stable.  On metformin  500 mg daily without GI or other side effects.  Last A1c was 5.8, showing improvement. Discussed potential dose increase. No vision changes, compliant with medication. - Continue/refill Mounjaro  7.5 mg weekly Continue and refill Metformin  500 mg daily.  - Recheck A1c in two months - Schedule fasting labs and fasting  IC on March 6 at 7 AM She is working  on nutrition plan to decrease simple carbohydrates, increase lean proteins and exercise to promote weight loss and improve glycemic control .  Other Depression/Emotional eating behavior On Wellbutrin  SR 150 mg daily with no side effects, taken daily around 3:00-3:30 PM. No mood changes reported. -  Continue/refill  Wellbutrin  SR 150 mg daily as prescribed - continue to work on emotional eating strategies.   General Health Maintenance Engaged in weight loss program with dietary and exercise modifications. No new issues discussed. - Encourage continued participation in the weight loss program - Monitor progress and adjust plan as needed  Follow-up - Follow-up appointment on February 5 at 4 PM - Schedule fasting labs and fasting IC on March 6 at 7 AM.  Vitals Temp: 98.5 F (36.9 C) BP: 109/68 Pulse Rate: 94 SpO2: 97 %   Anthropometric Measurements Height: 5' 4 (1.626 m) Weight: 193 lb (87.5 kg) BMI (Calculated): 33.11 Weight at Last Visit: 195 LB Weight Lost Since Last Visit: 2 lb Weight Gained Since Last Visit: 0 Starting Weight: 200 LB Total Weight Loss (lbs): 7 lb (3.175 kg) Peak Weight: 220 LB   Body Composition  Body Fat %: 41.8 % Fat Mass (lbs): 80.8 lbs Muscle Mass (lbs): 106.8 lbs Total Body Water (lbs): 77.8 lbs Visceral Fat Rating : 12   Other Clinical Data Fasting: NO Labs: NO Today's Visit #: 36 Starting Date: 10/27/20     ASSESSMENT AND PLAN:  Diet: Victoria Garcia is currently in the action stage of change. As such, her goal is to continue with weight loss efforts. She has agreed to Category 2 Plan and keeping a food journal and adhering to recommended goals of 1200-1300 calories and 85 grams of  protein.  Exercise: Victoria Garcia has been instructed to continue exercising as is for weight loss and overall health benefits.   Behavior Modification:  We discussed the following Behavioral Modification Strategies today: increasing lean protein intake, decreasing simple carbohydrates, increasing vegetables, increase H2O intake, increase high fiber foods, no skipping meals, emotional eating strategies , planning for success, and keep a strict food journal. We discussed various medication options to help Victoria Garcia with her weight loss efforts and we both agreed to  continue Mounjaro  and metformin  for Type 2 diabetes management and Wellbutrin  for emotional eating behavior.  Return in about 4 weeks (around 06/28/2023).Victoria Garcia She was informed of the importance of frequent follow up visits to maximize her success with intensive lifestyle modifications for her multiple health conditions.  Attestation Statements:   Reviewed by clinician on day of visit: allergies, medications, problem list, medical history, surgical history, family history, social history, and previous encounter notes.   Time spent on visit including pre-visit chart review and post-visit care and charting was 31 minutes.    Martice Doty, PA-C

## 2023-06-02 ENCOUNTER — Other Ambulatory Visit: Payer: Self-pay

## 2023-06-02 ENCOUNTER — Other Ambulatory Visit (HOSPITAL_COMMUNITY): Payer: Self-pay

## 2023-06-05 ENCOUNTER — Other Ambulatory Visit (HOSPITAL_COMMUNITY): Payer: Self-pay

## 2023-06-28 ENCOUNTER — Ambulatory Visit (INDEPENDENT_AMBULATORY_CARE_PROVIDER_SITE_OTHER): Payer: Commercial Managed Care - PPO | Admitting: Physician Assistant

## 2023-07-06 ENCOUNTER — Ambulatory Visit (INDEPENDENT_AMBULATORY_CARE_PROVIDER_SITE_OTHER): Payer: 59 | Admitting: Adult Health

## 2023-07-06 ENCOUNTER — Other Ambulatory Visit (HOSPITAL_COMMUNITY): Payer: Self-pay

## 2023-07-06 ENCOUNTER — Encounter (INDEPENDENT_AMBULATORY_CARE_PROVIDER_SITE_OTHER): Payer: Self-pay | Admitting: Adult Health

## 2023-07-06 VITALS — BP 113/60 | HR 84 | Temp 97.8°F | Ht 64.0 in | Wt 189.0 lb

## 2023-07-06 DIAGNOSIS — E1169 Type 2 diabetes mellitus with other specified complication: Secondary | ICD-10-CM

## 2023-07-06 DIAGNOSIS — E559 Vitamin D deficiency, unspecified: Secondary | ICD-10-CM | POA: Diagnosis not present

## 2023-07-06 DIAGNOSIS — Z6832 Body mass index (BMI) 32.0-32.9, adult: Secondary | ICD-10-CM

## 2023-07-06 DIAGNOSIS — F39 Unspecified mood [affective] disorder: Secondary | ICD-10-CM

## 2023-07-06 DIAGNOSIS — Z7985 Long-term (current) use of injectable non-insulin antidiabetic drugs: Secondary | ICD-10-CM | POA: Diagnosis not present

## 2023-07-06 DIAGNOSIS — E669 Obesity, unspecified: Secondary | ICD-10-CM

## 2023-07-06 DIAGNOSIS — E1165 Type 2 diabetes mellitus with hyperglycemia: Secondary | ICD-10-CM

## 2023-07-06 DIAGNOSIS — Z7984 Long term (current) use of oral hypoglycemic drugs: Secondary | ICD-10-CM

## 2023-07-06 DIAGNOSIS — E66811 Obesity, class 1: Secondary | ICD-10-CM

## 2023-07-06 DIAGNOSIS — E119 Type 2 diabetes mellitus without complications: Secondary | ICD-10-CM

## 2023-07-06 DIAGNOSIS — E785 Hyperlipidemia, unspecified: Secondary | ICD-10-CM | POA: Diagnosis not present

## 2023-07-06 MED ORDER — TIRZEPATIDE 7.5 MG/0.5ML ~~LOC~~ SOAJ
7.5000 mg | SUBCUTANEOUS | 0 refills | Status: DC
Start: 2023-07-06 — End: 2023-07-27
  Filled 2023-07-06: qty 2, 28d supply, fill #0

## 2023-07-06 NOTE — Progress Notes (Signed)
WEIGHT SUMMARY AND BIOMETRICS  Vitals Temp: 97.8 F (36.6 C) BP: 113/60 Pulse Rate: 84 SpO2: 98 %   Anthropometric Measurements Height: 5\' 4"  (1.626 m) Weight: 189 lb (85.7 kg) BMI (Calculated): 32.43 Weight at Last Visit: 193lb Weight Lost Since Last Visit: 4lb Weight Gained Since Last Visit: 0 Starting Weight: 200lb Total Weight Loss (lbs): 11 lb (4.99 kg) Peak Weight: 220lb   Body Composition  Body Fat %: 41.1 % Fat Mass (lbs): 77.8 lbs Muscle Mass (lbs): 106 lbs Total Body Water (lbs): 76.8 lbs Visceral Fat Rating : 11   Other Clinical Data Fasting: no Labs: no Today's Visit #: 37 Starting Date: 10/27/20    Chief Complaint:   OBESITY Victoria Garcia is here to discuss her progress with her obesity treatment plan.  She is on the the Category 2 Plan and states she is following her eating plan approximately 90 % of the time.  She states she is exercising 30 minutes 4 times per week.   Interim History:  Reviewed Bioimpedance results with pt: Muscle Mass: -0.8 lb Adipose Mass: - 3 lbs  Victoria Garcia is  Victoria Garcia and has been with Victoria Garcia for 38 years! 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 They have two adult children, age 17 (girl) and age 48 (boy).  She will increase daily steps when working by "walking the ling way in/out of work".  She has made a very concerted effort to increase daily protein intake  Subjective:   1. Hyperlipidemia associated with type 2 diabetes mellitus (HCC) Lipid Panel     Component Value Date/Time   CHOL 148 01/09/2023 0833   TRIG 101 01/09/2023 0833   HDL 43 01/09/2023 0833   CHOLHDL 4.2 01/12/2022 1552   CHOLHDL 4.4 02/17/2021 0825   VLDL 21 01/13/2017 0924   LDLCALC 86 01/09/2023 0833   LDLCALC 105 (H) 02/17/2021 0825   LDLDIRECT 85 06/17/2021 1026   LABVLDL 19 01/09/2023 0833    The 10-year ASCVD risk score (Arnett DK, et al., 2019) is: 8.1%   She is on daily Lipitor 40mg   2. New Onset  T2D She is on was Ozempic/Wegovy and then [name] in 2023-but no insurance coverage for this since January 2024, Lab Results  Component Value Date   HGBA1C 5.8 (A) 05/08/2023   HGBA1C 6.6 (H) 01/09/2023   HGBA1C 6.4 (A) 10/04/2022    01/09/2023- NEW ONSET T2D  Started on Mounjaro 2.5mg  01/17/2023 Mounjaro increased from 2.5mg  to 5 mg on 02/09/2023 Mounjaro increased from 5mg  to 7.5mg  on 04/06/2023 She has continued on daily Metformin 500mg  Denies mass in neck, dysphagia, dyspepsia, persistent hoarseness, abdominal pain, or N/V/C  She denies sx's of hypoglycemia She does not have a home glucometer- she declines an order for home meter  3. Vitamin D deficiency  Latest Reference Range & Units 01/09/23 08:33  Vitamin D, 25-Hydroxy 30.0 - 100.0 ng/mL 49.8   She is on daily OTC Vit D 3 2,000 international units   4. Mood disorder (HCC), emotional eating She endorses stable mood BP at goal at OV She is on daily Wellbutrin SR 150mg   Assessment/Plan:   1. Hyperlipidemia associated with type 2 diabetes mellitus (HCC) (Primary) Continue to limit sat fat  Continue to increase daily steps Continue daily statin therapy  2. New Onset T2D Refill   tirzepatide (MOUNJARO) 7.5 MG/0.5ML Pen Inject 7.5 mg into the skin once a week. Dispense: 2 mL, Refills: 0 of 0 remaining  07/06/2023 -- Victoria Fusi, NP  3. Vitamin D deficiency Continue daily OTC Vit D 3 2,000 international units   4. Mood disorder (HCC), emotional eating Continue to increase daily walking  5. Obesity (BMI 30.0-34.9) Current BMI 32.5  Victoria Garcia is currently in the action stage of change. As such, her goal is to continue with weight loss efforts. She has agreed to the Category 2 Plan.   Exercise goals: All adults should avoid inactivity. Some physical activity is better than none, and adults who participate in any amount of physical activity gain some health benefits. Adults should also include muscle-strengthening  activities that involve all major muscle groups on 2 or more days a week.  Behavioral modification strategies: increasing lean protein intake, decreasing simple carbohydrates, increasing vegetables, increasing water intake, no skipping meals, meal planning and cooking strategies, keeping healthy foods in the home, ways to avoid boredom eating, and planning for success.  Victoria Garcia has agreed to follow-up with our clinic in 4 weeks. She was informed of the importance of frequent follow-up visits to maximize her success with intensive lifestyle modifications for her multiple health conditions.   Check Fasting Labs at next OV  Objective:   Blood pressure 113/60, pulse 84, temperature 97.8 F (36.6 C), height 5\' 4"  (1.626 m), weight 189 lb (85.7 kg), SpO2 98%. Body mass index is 32.44 kg/m.  General: Cooperative, alert, well developed, in no acute distress. HEENT: Conjunctivae and lids unremarkable. Cardiovascular: Regular rhythm.  Lungs: Normal work of breathing. Neurologic: No focal deficits.   Lab Results  Component Value Date   CREATININE 0.72 01/09/2023   BUN 17 01/09/2023   NA 140 01/09/2023   K 4.3 01/09/2023   CL 101 01/09/2023   CO2 25 01/09/2023   Lab Results  Component Value Date   ALT 22 01/09/2023   AST 19 01/09/2023   ALKPHOS 98 01/09/2023   BILITOT 0.7 01/09/2023   Lab Results  Component Value Date   HGBA1C 5.8 (A) 05/08/2023   HGBA1C 6.6 (H) 01/09/2023   HGBA1C 6.4 (A) 10/04/2022   HGBA1C 6.0 (H) 06/13/2022   HGBA1C 6.1 (H) 01/12/2022   Lab Results  Component Value Date   INSULIN 23.6 01/09/2023   INSULIN 13.8 06/13/2022   INSULIN 14.3 10/27/2020   Lab Results  Component Value Date   TSH 1.510 01/09/2023   Lab Results  Component Value Date   CHOL 148 01/09/2023   HDL 43 01/09/2023   LDLCALC 86 01/09/2023   LDLDIRECT 85 06/17/2021   TRIG 101 01/09/2023   CHOLHDL 4.2 01/12/2022   Lab Results  Component Value Date   VD25OH 49.8 01/09/2023    VD25OH 55.7 06/13/2022   VD25OH 48.7 01/12/2022   Lab Results  Component Value Date   WBC 6.6 01/09/2023   HGB 12.9 01/09/2023   HCT 39.9 01/09/2023   MCV 92 01/09/2023   PLT 211 01/09/2023   Lab Results  Component Value Date   IRON 50 09/24/2014   Attestation Statements:   Reviewed by clinician on day of visit: allergies, medications, problem list, medical history, surgical history, family history, social history, and previous encounter notes.  I have reviewed the above documentation for accuracy and completeness, and I agree with the above. -  Mico Spark d. Kellianne Ek, NP-C

## 2023-07-13 DIAGNOSIS — Z1211 Encounter for screening for malignant neoplasm of colon: Secondary | ICD-10-CM | POA: Diagnosis not present

## 2023-07-13 DIAGNOSIS — E119 Type 2 diabetes mellitus without complications: Secondary | ICD-10-CM | POA: Diagnosis not present

## 2023-07-13 DIAGNOSIS — K5900 Constipation, unspecified: Secondary | ICD-10-CM | POA: Diagnosis not present

## 2023-07-13 DIAGNOSIS — E782 Mixed hyperlipidemia: Secondary | ICD-10-CM | POA: Diagnosis not present

## 2023-07-27 ENCOUNTER — Encounter (INDEPENDENT_AMBULATORY_CARE_PROVIDER_SITE_OTHER): Payer: Self-pay | Admitting: Physician Assistant

## 2023-07-27 ENCOUNTER — Ambulatory Visit (INDEPENDENT_AMBULATORY_CARE_PROVIDER_SITE_OTHER): Payer: 59 | Admitting: Physician Assistant

## 2023-07-27 ENCOUNTER — Other Ambulatory Visit (HOSPITAL_COMMUNITY): Payer: Self-pay

## 2023-07-27 VITALS — BP 106/69 | HR 80 | Temp 98.2°F | Ht 64.0 in | Wt 188.0 lb

## 2023-07-27 DIAGNOSIS — F5089 Other specified eating disorder: Secondary | ICD-10-CM

## 2023-07-27 DIAGNOSIS — E669 Obesity, unspecified: Secondary | ICD-10-CM

## 2023-07-27 DIAGNOSIS — R0602 Shortness of breath: Secondary | ICD-10-CM

## 2023-07-27 DIAGNOSIS — E1169 Type 2 diabetes mellitus with other specified complication: Secondary | ICD-10-CM | POA: Diagnosis not present

## 2023-07-27 DIAGNOSIS — Z7985 Long-term (current) use of injectable non-insulin antidiabetic drugs: Secondary | ICD-10-CM | POA: Diagnosis not present

## 2023-07-27 DIAGNOSIS — E1165 Type 2 diabetes mellitus with hyperglycemia: Secondary | ICD-10-CM

## 2023-07-27 DIAGNOSIS — F39 Unspecified mood [affective] disorder: Secondary | ICD-10-CM

## 2023-07-27 DIAGNOSIS — E559 Vitamin D deficiency, unspecified: Secondary | ICD-10-CM

## 2023-07-27 DIAGNOSIS — Z7984 Long term (current) use of oral hypoglycemic drugs: Secondary | ICD-10-CM | POA: Diagnosis not present

## 2023-07-27 DIAGNOSIS — E785 Hyperlipidemia, unspecified: Secondary | ICD-10-CM

## 2023-07-27 DIAGNOSIS — Z6832 Body mass index (BMI) 32.0-32.9, adult: Secondary | ICD-10-CM

## 2023-07-27 MED ORDER — TIRZEPATIDE 10 MG/0.5ML ~~LOC~~ SOAJ
10.0000 mg | SUBCUTANEOUS | 0 refills | Status: DC
  Filled 2023-07-27 – 2023-07-31 (×2): qty 2, 28d supply, fill #0

## 2023-07-27 NOTE — Progress Notes (Signed)
 SUBJECTIVE: Discussed the use of AI scribe software for clinical note transcription with the patient, who gave verbal consent to proceed.  Chief Complaint: Obesity  Interim History: She is up 1 lb since last visit.  Bio impedence scale reviewed with the patient:  Muscle mass + 1 lb Adipose mass -2.6 lbs Total body water -1.8 lbs  Victoria Garcia is here to discuss her progress with her obesity treatment plan. She is on the Category 2 Plan and breakfast and lunch options  and states she is following her eating plan approximately 80 % of the time. She states she is exercising pilates 60 minutes 2 times per week.  Victoria Garcia is a 63 year old female who presents for follow-up of her obesity treatment plan.  She is currently on Mounjaro 7.5 mg weekly, metformin 500 mg daily, cholecalciferol 2000 IU daily, bupropion SR 150 mg daily, and atorvastatin 40 mg daily. She feels good overall, with her clothes fitting differently, indicating possible weight loss or muscle gain. She has noticed an increase in muscle mass, gaining 1.2 pounds, and a decrease in adipose tissue from 77.8 to 75.2. Her visceral adipose rating is down to 11, below the goal of 12.  She underwent a follow-up metabolism test, including indirect calorimetry, which showed a resting energy expenditure of 1757 calories, higher than the predicted 1489 calories. She attributes this improvement to increased exercise and muscle mass gain. Previously, her metabolism was recorded at 1899 calories, which has now decreased slightly to 1858 calories. Her energy levels have been good, although she occasionally increases her use of metformin and bupropion towards the end of the week, particularly on Saturdays, to manage cravings. She has been on the current dose of Mounjaro for at least three rounds and notes that she experiences cravings towards the end of the week.  She is currently working and has plans to start reducing her work commitments by  January. She is dissatisfied with her work environment, noting that her employer has been inflexible with scheduling and time off.  Repeat IC done today showing slight decline by 41 calories, but higher than predicted REE at 1858 (Predicted 1489)  She is on Cat 2 plan and this should continue to promote gradual weight loss.   Fasting labs done today.  The patient was informed we would discuss the lab results at the next visit unless there is a critical issue that needs to be addressed sooner. The patient agreed to keep the next visit at the agreed upon time to discuss these results.   OBJECTIVE: Visit Diagnoses: Problem List Items Addressed This Visit     Vitamin D deficiency   Relevant Orders   VITAMIN D 25 Hydroxy (Vit-D Deficiency, Fractures)   Mood disorder (HCC), emotional eating   Other Visit Diagnoses       SOBOE (shortness of breath on exertion)    -  Primary   Relevant Orders   Vitamin B12   CBC with Differential/Platelet     New diagnosis of Type 2 diabetes without long term current  insulin use       Relevant Medications   tirzepatide (MOUNJARO) 10 MG/0.5ML Pen   Other Relevant Orders   CMP14+EGFR   Hemoglobin A1c   Insulin, random     Hyperlipidemia associated with type 2 diabetes mellitus (HCC)       Relevant Medications   tirzepatide (MOUNJARO) 10 MG/0.5ML Pen   Other Relevant Orders   Lipid Panel With LDL/HDL Ratio  Generalized obesity- Start BMI 34.33       Relevant Medications   tirzepatide (MOUNJARO) 10 MG/0.5ML Pen     BMI 32.0-32.9,adult Current BMI 32.3          SOBOE  Her resting energy expenditure is 1757 calories, indicating a positive metabolic response. She has gained 1.2 pounds of muscle mass, contributing to increased metabolism.This is very slightly lower than REE 1899 in 10-2020 by 41 calories. She continues on Cat 2 nutrition plan with continued gradual loss of adipose and improving muscle mass since starting Mounjaro.   Obesity She is  undergoing treatment for obesity with Mounjaro 7.5 mg weekly and metformin 500 mg daily.  Her adipose percentage decreased from 77.8 to 75.2, and her visceral adipose rating is now 11, below the goal of 12. She reports feeling stronger but experiences cravings before her next Mounjaro dose. The decision was made to increase Mounjaro to 10 mg weekly to address these cravings and potentially reduce the need for metformin and bupropion SR. - Increase Mounjaro to 10 mg weekly. - Continue current exercise regimen. - Monitor weight and muscle mass. - Reassess the need for metformin and bupropion SR at the next visit.  Type 2 Diabetes Mellitus She is on Mounjaro 7.5 mg weekly and metformin 500 mg daily for type 2 diabetes. She reports good energy levels and has been taking metformin less frequently. The plan is to monitor her response to the increased Mounjaro dose and reassess the need for metformin at the next visit. Lab Results  Component Value Date   HGBA1C 5.8 (A) 05/08/2023   HGBA1C 6.6 (H) 01/09/2023   HGBA1C 6.4 (A) 10/04/2022   Lab Results  Component Value Date   LDLCALC 86 01/09/2023   CREATININE 0.72 01/09/2023   She is working  on nutrition plan to decrease simple carbohydrates, increase lean proteins and exercise to promote weight loss and improve glycemic control . Orders Placed This Encounter  Procedures   Vitamin B12   CBC with Differential/Platelet   CMP14+EGFR   Hemoglobin A1c   Insulin, random   Lipid Panel With LDL/HDL Ratio   VITAMIN D 25 Hydroxy (Vit-D Deficiency, Fractures)    Meds ordered this encounter  Medications   tirzepatide (MOUNJARO) 10 MG/0.5ML Pen    Sig: Inject 10 mg into the skin once a week.    Dispense:  2 mL    Refill:  0    Increase Mounjaro to 10 mg weekly.  - Order HbA1c and insulin level and CMET - Reassess metformin use at the next visit.  Emotional Eating She is on bupropion SR 150 mg daily for emotional eating and reports using it less  frequently. The necessity of bupropion SR will be reassessed after increasing the Mounjaro dose. No side effects. No refill needed.  - Reassess the need for bupropion SR at the next visit.  Hyperlipidemia She is on atorvastatin 40 mg daily for hyperlipidemia. Her lipid levels will be assessed during the current visit. Last lipids Lab Results  Component Value Date   CHOL 148 01/09/2023   HDL 43 01/09/2023   LDLCALC 86 01/09/2023   LDLDIRECT 85 06/17/2021   TRIG 101 01/09/2023   CHOLHDL 4.2 01/12/2022    - Order fasting lipid panel. Continue atorvastatin 40 mg and continue Mounjaro to decrease CV risk.  Continue to work on nutrition plan -decreasing simple carbohydrates, increasing lean proteins, decreasing saturated fats and cholesterol , avoiding trans fats and exercise as able to promote weight loss,  improve lipids and decrease cardiovascular risks.  Vitamin D Deficiency She is on cholecalciferol 2000 IU daily for vitamin D deficiency. Her vitamin D levels will be assessed during the current visit. - Order vitamin D level test.  Vitals Temp: 98.2 F (36.8 C) BP: 106/69 Pulse Rate: 80 SpO2: 100 %   Anthropometric Measurements Height: 5\' 4"  (1.626 m) Weight: 188 lb (85.3 kg) BMI (Calculated): 32.25 Weight at Last Visit: 189 lb Weight Lost Since Last Visit: 0 Weight Gained Since Last Visit: 1 lb Starting Weight: 200 lb Total Weight Loss (lbs): 10 lb (4.536 kg) Peak Weight: 220 lb   Body Composition  Body Fat %: 40 % Fat Mass (lbs): 75.2 lbs Muscle Mass (lbs): 107.2 lbs Total Body Water (lbs): 75 lbs Visceral Fat Rating : 11   Other Clinical Data Fasting: no Labs: no Today's Visit #: 38 Starting Date: 10/27/20     ASSESSMENT AND PLAN:  Diet: Victoria Garcia is currently in the action stage of change. As such, her goal is to continue with weight loss efforts and has agreed to the Category 2 Plan with breakfast and lunch options.   Exercise:  For substantial  health benefits, adults should do at least 150 minutes (2 hours and 30 minutes) a week of moderate-intensity, or 75 minutes (1 hour and 15 minutes) a week of vigorous-intensity aerobic physical activity, or an equivalent combination of moderate- and vigorous-intensity aerobic activity. Aerobic activity should be performed in episodes of at least 10 minutes, and preferably, it should be spread throughout the week. and Adults should also include muscle-strengthening activities that involve all major muscle groups on 2 or more days a week.  Behavior Modification:  We discussed the following Behavioral Modification Strategies today: increasing lean protein intake, decreasing simple carbohydrates, increasing vegetables, increase H2O intake, increase high fiber foods, emotional eating strategies , avoiding temptations, and planning for success. We discussed various medication options to help Victoria Garcia with her weight loss efforts and we both agreed to increase Mounjaro to 10 mg weekly for Type 2 diabetes management.  Return in about 4 weeks (around 08/24/2023).Marland Kitchen She was informed of the importance of frequent follow up visits to maximize her success with intensive lifestyle modifications for her multiple health conditions.  Attestation Statements:   Reviewed by clinician on day of visit: allergies, medications, problem list, medical history, surgical history, family history, social history, and previous encounter notes.   Time spent on visit including pre-visit chart review and post-visit care and charting was 33 minutes  Laurens Matheny,PA-C

## 2023-07-28 LAB — CMP14+EGFR
ALT: 34 IU/L — ABNORMAL HIGH (ref 0–32)
AST: 25 IU/L (ref 0–40)
Albumin: 4.2 g/dL (ref 3.9–4.9)
Alkaline Phosphatase: 122 IU/L — ABNORMAL HIGH (ref 44–121)
BUN/Creatinine Ratio: 25 (ref 12–28)
BUN: 19 mg/dL (ref 8–27)
Bilirubin Total: 0.8 mg/dL (ref 0.0–1.2)
CO2: 25 mmol/L (ref 20–29)
Calcium: 9.5 mg/dL (ref 8.7–10.3)
Chloride: 101 mmol/L (ref 96–106)
Creatinine, Ser: 0.77 mg/dL (ref 0.57–1.00)
Globulin, Total: 3.2 g/dL (ref 1.5–4.5)
Glucose: 94 mg/dL (ref 70–99)
Potassium: 4.5 mmol/L (ref 3.5–5.2)
Sodium: 140 mmol/L (ref 134–144)
Total Protein: 7.4 g/dL (ref 6.0–8.5)
eGFR: 88 mL/min/{1.73_m2} (ref >59)

## 2023-07-28 LAB — LIPID PANEL WITH LDL/HDL RATIO
Cholesterol, Total: 159 mg/dL (ref 100–199)
HDL: 42 mg/dL (ref >39)
LDL Chol Calc (NIH): 101 mg/dL — ABNORMAL HIGH (ref 0–99)
LDL/HDL Ratio: 2.4 ratio (ref 0.0–3.2)
Triglycerides: 86 mg/dL (ref 0–149)
VLDL Cholesterol Cal: 16 mg/dL (ref 5–40)

## 2023-07-28 LAB — CBC WITH DIFFERENTIAL/PLATELET
Basophils Absolute: 0 10*3/uL (ref 0.0–0.2)
Basos: 0 %
EOS (ABSOLUTE): 0.1 10*3/uL (ref 0.0–0.4)
Eos: 1 %
Hematocrit: 43.1 % (ref 34.0–46.6)
Hemoglobin: 14 g/dL (ref 11.1–15.9)
Immature Grans (Abs): 0 10*3/uL (ref 0.0–0.1)
Immature Granulocytes: 0 %
Lymphocytes Absolute: 2 10*3/uL (ref 0.7–3.1)
Lymphs: 25 %
MCH: 30.6 pg (ref 26.6–33.0)
MCHC: 32.5 g/dL (ref 31.5–35.7)
MCV: 94 fL (ref 79–97)
Monocytes Absolute: 0.5 10*3/uL (ref 0.1–0.9)
Monocytes: 7 %
Neutrophils Absolute: 5.5 10*3/uL (ref 1.4–7.0)
Neutrophils: 67 %
Platelets: 257 10*3/uL (ref 150–450)
RBC: 4.57 x10E6/uL (ref 3.77–5.28)
RDW: 12.4 % (ref 11.7–15.4)
WBC: 8.2 10*3/uL (ref 3.4–10.8)

## 2023-07-28 LAB — INSULIN, RANDOM: INSULIN: 13.6 u[IU]/mL (ref 2.6–24.9)

## 2023-07-28 LAB — VITAMIN B12: Vitamin B-12: 736 pg/mL (ref 232–1245)

## 2023-07-28 LAB — HEMOGLOBIN A1C
Est. average glucose Bld gHb Est-mCnc: 128 mg/dL
Hgb A1c MFr Bld: 6.1 % — ABNORMAL HIGH (ref 4.8–5.6)

## 2023-07-28 LAB — VITAMIN D 25 HYDROXY (VIT D DEFICIENCY, FRACTURES): Vit D, 25-Hydroxy: 58.2 ng/mL (ref 30.0–100.0)

## 2023-07-31 ENCOUNTER — Other Ambulatory Visit (HOSPITAL_COMMUNITY): Payer: Self-pay

## 2023-08-21 ENCOUNTER — Encounter (INDEPENDENT_AMBULATORY_CARE_PROVIDER_SITE_OTHER): Payer: Self-pay | Admitting: Physician Assistant

## 2023-08-21 ENCOUNTER — Other Ambulatory Visit (HOSPITAL_COMMUNITY): Payer: Self-pay

## 2023-08-21 ENCOUNTER — Ambulatory Visit (INDEPENDENT_AMBULATORY_CARE_PROVIDER_SITE_OTHER): Payer: 59 | Admitting: Physician Assistant

## 2023-08-21 VITALS — BP 107/67 | HR 82 | Temp 98.2°F | Ht 64.0 in | Wt 190.0 lb

## 2023-08-21 DIAGNOSIS — E669 Obesity, unspecified: Secondary | ICD-10-CM

## 2023-08-21 DIAGNOSIS — Z7985 Long-term (current) use of injectable non-insulin antidiabetic drugs: Secondary | ICD-10-CM | POA: Diagnosis not present

## 2023-08-21 DIAGNOSIS — E1169 Type 2 diabetes mellitus with other specified complication: Secondary | ICD-10-CM

## 2023-08-21 DIAGNOSIS — Z7984 Long term (current) use of oral hypoglycemic drugs: Secondary | ICD-10-CM | POA: Diagnosis not present

## 2023-08-21 DIAGNOSIS — Z6832 Body mass index (BMI) 32.0-32.9, adult: Secondary | ICD-10-CM

## 2023-08-21 DIAGNOSIS — F5089 Other specified eating disorder: Secondary | ICD-10-CM | POA: Diagnosis not present

## 2023-08-21 DIAGNOSIS — E785 Hyperlipidemia, unspecified: Secondary | ICD-10-CM | POA: Diagnosis not present

## 2023-08-21 DIAGNOSIS — E1165 Type 2 diabetes mellitus with hyperglycemia: Secondary | ICD-10-CM

## 2023-08-21 DIAGNOSIS — F39 Unspecified mood [affective] disorder: Secondary | ICD-10-CM

## 2023-08-21 DIAGNOSIS — E559 Vitamin D deficiency, unspecified: Secondary | ICD-10-CM | POA: Diagnosis not present

## 2023-08-21 MED ORDER — BUPROPION HCL ER (SR) 150 MG PO TB12
150.0000 mg | ORAL_TABLET | Freq: Every day | ORAL | 1 refills | Status: DC
  Filled 2023-08-21: qty 18, 18d supply, fill #0
  Filled 2023-08-21: qty 12, 12d supply, fill #0

## 2023-08-21 MED ORDER — TIRZEPATIDE 10 MG/0.5ML ~~LOC~~ SOAJ
10.0000 mg | SUBCUTANEOUS | 0 refills | Status: DC
  Filled 2023-08-21 – 2023-08-23 (×2): qty 2, 28d supply, fill #0

## 2023-08-21 MED ORDER — METFORMIN HCL 500 MG PO TABS
500.0000 mg | ORAL_TABLET | Freq: Every day | ORAL | 0 refills | Status: DC
  Filled 2023-08-21: qty 30, 30d supply, fill #0

## 2023-08-21 NOTE — Progress Notes (Signed)
 SUBJECTIVE: Discussed the use of AI scribe software for clinical note transcription with the patient, who gave verbal consent to proceed.  Chief Complaint: Obesity  Interim History: She is up 2 lbs since last visit.   Victoria Garcia is here to discuss her progress with her obesity treatment plan. She is on the Category 2 Plan with breakfast and lunch options and states she is following her eating plan approximately 75 % of the time. She states she is exercising Pilate's for 60 minutes 3 times per week.  Victoria Garcia is a 63 year old female with obesity, type 2 diabetes, and hyperlipidemia who presents for follow-up of her obesity treatment plan.  She is experiencing stress eating due to work-related stress, which has led to some weight gain. Her workplace is short-staffed, leading to increased demands and stress, impacting her eating habits. Despite these challenges, she maintains her workout routine.  She has type 2 diabetes and is on Mounjaro 10 mg weekly and metformin 500 mg daily. Recent lab work showed a slight increase in A1c. Her insulin levels have improved, indicating better carbohydrate management. No gastrointestinal side effects from metformin. No side effects with Mounjaro -Denies mass in neck, dysphagia, dyspepsia, persistent hoarseness, abdominal pain, or N/V/Constipation or diarrhea. Has annual eye exam. Mood is stable.    She is managing hyperlipidemia and is on Lipitor. Cholesterol levels are not yet at the desired target, but she is monitoring her saturated fat intake.  She takes bupropion 150 mg daily to help manage emotional eating and stress. She reports no issues with this medication and finds it helpful in dealing with stress at work.  She is taking over-the-counter vitamin D 2000 units daily for her vitamin D deficiency, which is currently well-managed.  OBJECTIVE: Visit Diagnoses: Problem List Items Addressed This Visit     Vitamin D deficiency   Mood disorder  (HCC), emotional eating   Relevant Medications   buPROPion (WELLBUTRIN SR) 150 MG 12 hr tablet   Other Visit Diagnoses       New diagnosis of Type 2 diabetes without long term current  insulin use    -  Primary   Relevant Medications   tirzepatide (MOUNJARO) 10 MG/0.5ML Pen   metFORMIN (GLUCOPHAGE) 500 MG tablet     Hyperlipidemia associated with type 2 diabetes mellitus (HCC)       Relevant Medications   tirzepatide (MOUNJARO) 10 MG/0.5ML Pen   metFORMIN (GLUCOPHAGE) 500 MG tablet     Generalized obesity- Start BMI 34.33       Relevant Medications   tirzepatide (MOUNJARO) 10 MG/0.5ML Pen   metFORMIN (GLUCOPHAGE) 500 MG tablet     Obesity She is experiencing stress eating due to work-related stress, contributing to slight weight gain. Currently on Mounjaro, recently increased, with plans to maintain the current dosage. Emphasis on stress management and exercise. - Continue Mounjaro at current dosage - Encourage stress management techniques and regular exercise  Emotional Eating Stress eating due to work-related stress impacts her weight management. Emphasis on stress management, exercise, and social support. - Encourage stress management techniques - Promote regular exercise and social support Continue Wellbutrin Meds ordered this encounter  Medications   tirzepatide (MOUNJARO) 10 MG/0.5ML Pen    Sig: Inject 10 mg into the skin once a week.    Dispense:  2 mL    Refill:  0   buPROPion (WELLBUTRIN SR) 150 MG 12 hr tablet    Sig: Take 1 tablet (150 mg total) by mouth  daily.    Dispense:  30 tablet    Refill:  1    Ov for rf   metFORMIN (GLUCOPHAGE) 500 MG tablet    Sig: Take 1 tablet (500 mg total) by mouth daily.    Dispense:  30 tablet    Refill:  0     Type 2 Diabetes Mellitus Currently on metformin 500 mg daily. Recent labs show a slight increase in A1c, possibly due to holiday eating. Improved insulin levels indicate better carbohydrate management. Continuing  metformin is recommended for insulin resistance without adverse effects on stomach or kidney function. Lab Results  Component Value Date   HGBA1C 6.1 (H) 07/27/2023   HGBA1C 5.8 (A) 05/08/2023   HGBA1C 6.6 (H) 01/09/2023   Lab Results  Component Value Date   LDLCALC 101 (H) 07/27/2023   CREATININE 0.77 07/27/2023   INSULIN  Date Value Ref Range Status  07/27/2023 13.6 2.6 - 24.9 uIU/mL Final  ] - Continue metformin 500 mg daily and Mounjaro 10 mg weekly She is working  on nutrition plan to decrease simple carbohydrates, increase lean proteins and exercise to promote weight loss and improve glycemic control . Meds ordered this encounter  Medications   tirzepatide (MOUNJARO) 10 MG/0.5ML Pen    Sig: Inject 10 mg into the skin once a week.    Dispense:  2 mL    Refill:  0   buPROPion (WELLBUTRIN SR) 150 MG 12 hr tablet    Sig: Take 1 tablet (150 mg total) by mouth daily.    Dispense:  30 tablet    Refill:  1    Ov for rf   metFORMIN (GLUCOPHAGE) 500 MG tablet    Sig: Take 1 tablet (500 mg total) by mouth daily.    Dispense:  30 tablet    Refill:  0     Hyperlipidemia Cholesterol levels monitored with a focus on reducing LDL below 70. Mounjaro expected to improve cholesterol. Advised to reduce saturated fat intake. Lab Results  Component Value Date   CHOL 159 07/27/2023   CHOL 148 01/09/2023   CHOL 144 06/13/2022   Lab Results  Component Value Date   HDL 42 07/27/2023   HDL 43 01/09/2023   HDL 49 06/13/2022   Lab Results  Component Value Date   LDLCALC 101 (H) 07/27/2023   LDLCALC 86 01/09/2023   LDLCALC 81 06/13/2022   Lab Results  Component Value Date   TRIG 86 07/27/2023   TRIG 101 01/09/2023   TRIG 71 06/13/2022   Lab Results  Component Value Date   CHOLHDL 4.2 01/12/2022   CHOLHDL 3.6 06/17/2021   CHOLHDL 4.4 02/17/2021   Lab Results  Component Value Date   LDLDIRECT 85 06/17/2021   Continue Statin therapy. Continue Mounjaro - Monitor  cholesterol levels Continue to work on nutrition plan -decreasing simple carbohydrates, increasing lean proteins, decreasing saturated fats and cholesterol , avoiding trans fats and exercise as able to promote weight loss, improve lipids and decrease cardiovascular risks.   Vitamin D Deficiency On over-the-counter vitamin D 2000 units daily with normal vitamin D levels in recent labs.Level is at goal. No N/V or muscle weakness with vitamin D OTC. Last vitamin D Lab Results  Component Value Date   VD25OH 58.2 07/27/2023   Low vitamin D levels can be associated with adiposity and may result in leptin resistance and weight gain. Also associated with fatigue.  Currently on vitamin D supplementation without any adverse effects such as nausea, vomiting  or muscle weakness.  - Continue vitamin D 2000 units daily  Vitals Temp: 98.2 F (36.8 C) BP: 107/67 Pulse Rate: 82 SpO2: 98 %   Anthropometric Measurements Height: 5\' 4"  (1.626 m) Weight: 190 lb (86.2 kg) BMI (Calculated): 32.6 Weight at Last Visit: 188 lb Weight Lost Since Last Visit: 0 Weight Gained Since Last Visit: 2 lb Starting Weight: 200 lb Total Weight Loss (lbs): 10 lb (4.536 kg) Peak Weight: 220 lb   Body Composition  Body Fat %: 42.1 % Fat Mass (lbs): 80.4 lbs Muscle Mass (lbs): 104.8 lbs Total Body Water (lbs): 80 lbs Visceral Fat Rating : 12   Other Clinical Data Fasting: no Labs: no Today's Visit #: 39 Starting Date: 10/27/20     ASSESSMENT AND PLAN:  Diet: Victoria Garcia is currently in the action stage of change. As such, her goal is to continue with weight loss efforts. She has agreed to Category 2 Plan.  Exercise: Victoria Garcia has been instructed to work up to a goal of 150 minutes of combined cardio and strengthening exercise per week for weight loss and overall health benefits.   Behavior Modification:  We discussed the following Behavioral Modification Strategies today: increasing lean protein intake,  decreasing simple carbohydrates, increasing vegetables, increase H2O intake, increase high fiber foods, no skipping meals, emotional eating strategies , avoiding temptations, and planning for success. We discussed various medication options to help Victoria Garcia with her weight loss efforts and we both agreed to continue [facility] and metformin for Type 2 diabetes and wellbutrin for emotional eating , continue to work on nutritional and behavioral strategies to promote weight loss.  .  Return in about 4 weeks (around 09/18/2023).Marland Kitchen She was informed of the importance of frequent follow up visits to maximize her success with intensive lifestyle modifications for her multiple health conditions.  Attestation Statements:   Reviewed by clinician on day of visit: allergies, medications, problem list, medical history, surgical history, family history, social history, and previous encounter notes.   Time spent on visit including pre-visit chart review and post-visit care and charting was 26 minutes.    Clancy Leiner, PA-C

## 2023-08-22 ENCOUNTER — Other Ambulatory Visit: Payer: Self-pay

## 2023-08-22 ENCOUNTER — Other Ambulatory Visit (HOSPITAL_COMMUNITY): Payer: Self-pay

## 2023-08-22 MED ORDER — GOLYTELY 236 G PO SOLR
ORAL | 0 refills | Status: DC
  Filled 2023-08-22: qty 4000, 1d supply, fill #0

## 2023-08-23 ENCOUNTER — Other Ambulatory Visit (HOSPITAL_COMMUNITY): Payer: Self-pay

## 2023-09-04 DIAGNOSIS — Z1211 Encounter for screening for malignant neoplasm of colon: Secondary | ICD-10-CM | POA: Diagnosis not present

## 2023-09-17 NOTE — Progress Notes (Unsigned)
 SUBJECTIVE: Discussed the use of AI scribe software for clinical note transcription with the patient, who gave verbal consent to proceed.  Chief Complaint: Obesity  Interim History: She is down 6 lbs since last visit Down 16 lbs overall TBW loss of 8 % Current BMI 31.6 Bassy is here to discuss her progress with her obesity treatment plan. She is on the Category 2 Plan with breakfast and lunch options and states she is following her eating plan approximately 80 % of the time. She states she is exercising Pilate's and walking 45-60 minutes 5 times per week.  MCKALYN WILLENBERG is a 63 year old female with obesity and type two diabetes who presents for follow-up of her obesity treatment plan.  She has lost seven pounds since her last visit, attributing this success to her current regimen. She engages in Pilates, specifically using a reformer, and has noticed an increase in muscle mass and a decrease in adipose tissue. She is pleased with her progress and is motivated to continue her current exercise routine.  She has a history of type two diabetes and is currently on Mounjaro  10 mg weekly and metformin  500 mg daily. She notes that the Mounjaro  is 'starting to kick in' and has not experienced any stomach upset with metformin . She is also on bupropion  150 mg daily for emotional support and Lipitor 40 mg daily for hypercholesterolemia.  OBJECTIVE: Visit Diagnoses: Problem List Items Addressed This Visit     Mood disorder (HCC), emotional eating   Relevant Medications   buPROPion  (WELLBUTRIN  SR) 150 MG 12 hr tablet   Other Visit Diagnoses       New diagnosis of Type 2 diabetes without long term current  insulin  use    -  Primary   Relevant Medications   metFORMIN  (GLUCOPHAGE ) 500 MG tablet   tirzepatide  (MOUNJARO ) 10 MG/0.5ML Pen     Hyperlipidemia associated with type 2 diabetes mellitus (HCC)       Relevant Medications   metFORMIN  (GLUCOPHAGE ) 500 MG tablet   tirzepatide  (MOUNJARO ) 10  MG/0.5ML Pen     Generalized obesity- Start BMI 34.33       Relevant Medications   metFORMIN  (GLUCOPHAGE ) 500 MG tablet   tirzepatide  (MOUNJARO ) 10 MG/0.5ML Pen     BMI 31.0-31.9,adult Current BMI 31.6         Obesity Obesity management is ongoing with positive progress. She has lost 16 pounds, approximately 8% of her body weight. Muscle mass has increased, and adipose tissue has decreased. She is compliant with dietary recommendations, including protein intake, and engages in Pilates. - Continue Mounjaro  10 mg weekly. - Encourage continued physical activity, including Pilates. - Advise maintaining protein intake of 85-90 grams per day.  Type 2 diabetes mellitus Type 2 diabetes is managed with Mounjaro  and metformin . Denies mass in neck, dysphagia, dyspepsia, persistent hoarseness, abdominal pain, or N/V/Constipation or diarrhea. Has annual eye exam. Mood is stable.  She reports no gastrointestinal side effects from metformin .  Lab Results  Component Value Date   HGBA1C 6.1 (H) 07/27/2023   HGBA1C 5.8 (A) 05/08/2023   HGBA1C 6.6 (H) 01/09/2023   Lab Results  Component Value Date   LDLCALC 101 (H) 07/27/2023   CREATININE 0.77 07/27/2023   She is working  on nutrition plan to decrease simple carbohydrates, increase lean proteins and exercise to promote weight loss and improve glycemic control . - Continue metformin  500 mg daily. - Continue Mounjaro  10 mg weekly. Meds ordered this encounter  Medications  buPROPion  (WELLBUTRIN  SR) 150 MG 12 hr tablet    Sig: Take 1 tablet (150 mg total) by mouth daily.    Dispense:  30 tablet    Refill:  1    Ov for rf   metFORMIN  (GLUCOPHAGE ) 500 MG tablet    Sig: Take 1 tablet (500 mg total) by mouth daily.    Dispense:  30 tablet    Refill:  0   tirzepatide  (MOUNJARO ) 10 MG/0.5ML Pen    Sig: Inject 10 mg into the skin once a week.    Dispense:  2 mL    Refill:  0    Hypercholesterolemia Hypercholesterolemia is managed with Lipitor.  Previous labs indicated LDL cholesterol was slightly elevated. Liver function tests will be rechecked in future visits. Lab Results  Component Value Date   CHOL 159 07/27/2023   CHOL 148 01/09/2023   CHOL 144 06/13/2022   Lab Results  Component Value Date   HDL 42 07/27/2023   HDL 43 01/09/2023   HDL 49 06/13/2022   Lab Results  Component Value Date   LDLCALC 101 (H) 07/27/2023   LDLCALC 86 01/09/2023   LDLCALC 81 06/13/2022   Lab Results  Component Value Date   TRIG 86 07/27/2023   TRIG 101 01/09/2023   TRIG 71 06/13/2022   Lab Results  Component Value Date   CHOLHDL 4.2 01/12/2022   CHOLHDL 3.6 06/17/2021   CHOLHDL 4.4 02/17/2021   Lab Results  Component Value Date   LDLDIRECT 85 06/17/2021   Continue to work on nutrition plan -decreasing simple carbohydrates, increasing lean proteins, decreasing saturated fats and cholesterol , avoiding trans fats and exercise as able to promote weight loss, improve lipids and decrease cardiovascular risks. - Continue Lipitor 40 mg daily. - Plan to recheck liver function tests in future visits.   Eating disorder/emotional eating Kylaya has had issues with stress/emotional eating. Currently this is well controlled. Overall mood is stable. Medication(s): Bupropion  SR 150 mg daily in am No side effects with bupropion .   Plan: Continue and refill Bupropion  SR 150 mg daily in am Continue to work on emotional eating strategies.   Vitals Temp: 98.1 F (36.7 C) BP: 110/70 Pulse Rate: 76 SpO2: 99 %   Anthropometric Measurements Height: 5\' 4"  (1.626 m) Weight: 184 lb (83.5 kg) BMI (Calculated): 31.57 Weight at Last Visit: 190 lb Weight Lost Since Last Visit: 6 lb Weight Gained Since Last Visit: 0 Starting Weight: 200 lb Total Weight Loss (lbs): 16 lb (7.258 kg) Peak Weight: 220 lb   Body Composition  Body Fat %: 39.9 % Fat Mass (lbs): 73.4 lbs Muscle Mass (lbs): 105.2 lbs Total Body Water (lbs): 77.4 lbs Visceral  Fat Rating : 11   Other Clinical Data Fasting: no Labs: no Today's Visit #: 40 Starting Date: 10/27/20     ASSESSMENT AND PLAN:  Diet: Billye is currently in the action stage of change. As such, her goal is to continue with weight loss efforts. She has agreed to Category 2 Plan.  Exercise: Mattisyn has been instructed to continue exercising as is for weight loss and overall health benefits.   Behavior Modification:  We discussed the following Behavioral Modification Strategies today: increasing lean protein intake, decreasing simple carbohydrates, increasing vegetables, increase H2O intake, increase high fiber foods, no skipping meals, meal planning and cooking strategies, avoiding temptations, and planning for success. We discussed various medication options to help Chanita with her weight loss efforts and we both agreed to continue current  obesity treatment plan .  Return in about 6 weeks (around 10/30/2023).Aaron Aas She was informed of the importance of frequent follow up visits to maximize her success with intensive lifestyle modifications for her multiple health conditions.  Attestation Statements:   Reviewed by clinician on day of visit: allergies, medications, problem list, medical history, surgical history, family history, social history, and previous encounter notes.   Time spent on visit including pre-visit chart review and post-visit care and charting was 25 minutes.    Eyoel Throgmorton, PA-C

## 2023-09-18 ENCOUNTER — Other Ambulatory Visit (HOSPITAL_COMMUNITY): Payer: Self-pay

## 2023-09-18 ENCOUNTER — Encounter (INDEPENDENT_AMBULATORY_CARE_PROVIDER_SITE_OTHER): Payer: Self-pay | Admitting: Physician Assistant

## 2023-09-18 ENCOUNTER — Ambulatory Visit (INDEPENDENT_AMBULATORY_CARE_PROVIDER_SITE_OTHER): Admitting: Physician Assistant

## 2023-09-18 VITALS — BP 110/70 | HR 76 | Temp 98.1°F | Ht 64.0 in | Wt 184.0 lb

## 2023-09-18 DIAGNOSIS — E669 Obesity, unspecified: Secondary | ICD-10-CM

## 2023-09-18 DIAGNOSIS — E1165 Type 2 diabetes mellitus with hyperglycemia: Secondary | ICD-10-CM

## 2023-09-18 DIAGNOSIS — Z7984 Long term (current) use of oral hypoglycemic drugs: Secondary | ICD-10-CM

## 2023-09-18 DIAGNOSIS — E785 Hyperlipidemia, unspecified: Secondary | ICD-10-CM

## 2023-09-18 DIAGNOSIS — Z7985 Long-term (current) use of injectable non-insulin antidiabetic drugs: Secondary | ICD-10-CM

## 2023-09-18 DIAGNOSIS — Z6831 Body mass index (BMI) 31.0-31.9, adult: Secondary | ICD-10-CM | POA: Diagnosis not present

## 2023-09-18 DIAGNOSIS — E1169 Type 2 diabetes mellitus with other specified complication: Secondary | ICD-10-CM | POA: Diagnosis not present

## 2023-09-18 DIAGNOSIS — F39 Unspecified mood [affective] disorder: Secondary | ICD-10-CM | POA: Diagnosis not present

## 2023-09-18 DIAGNOSIS — E559 Vitamin D deficiency, unspecified: Secondary | ICD-10-CM

## 2023-09-18 MED ORDER — TIRZEPATIDE 10 MG/0.5ML ~~LOC~~ SOAJ
10.0000 mg | SUBCUTANEOUS | 0 refills | Status: DC
  Filled 2023-09-18: qty 2, 28d supply, fill #0

## 2023-09-18 MED ORDER — METFORMIN HCL 500 MG PO TABS
500.0000 mg | ORAL_TABLET | Freq: Every day | ORAL | 0 refills | Status: DC
  Filled 2023-09-18: qty 30, 30d supply, fill #0

## 2023-09-18 MED ORDER — BUPROPION HCL ER (SR) 150 MG PO TB12
150.0000 mg | ORAL_TABLET | Freq: Every day | ORAL | 1 refills | Status: DC
  Filled 2023-09-18: qty 30, 30d supply, fill #0

## 2023-10-10 NOTE — Progress Notes (Signed)
 SUBJECTIVE: Discussed the use of AI scribe software for clinical note transcription with the patient, who gave verbal consent to proceed.  Chief Complaint: Obesity  Interim History: She is down 5 lbs since her last visit. Down 21 lbs overall TBW loss of 10.5%  Muscle mass + 0.6 lbs Adipose mass - 5.2 lbs Goal weight of 175 lbs.   Victoria Garcia is here to discuss her progress with her obesity treatment plan. She is on the Category 2 Plan and B/L options and states she is following her eating plan approximately 80 % of the time. She states she is exercising pilates and walking 45 minutes 3 times per week.  Victoria Garcia is a 63 year old female with obesity who presents for follow-up of her obesity treatment plan.  She is currently on Mounjaro  10 mg weekly and has lost a total of 21 pounds, including a 5-pound loss since her last visit. She adheres to a category two meal plan for breakfast and lunch about 80% of the time and engages in physical activities such as Pilates and walking for 45 minutes three times a week. No side effects from Mounjaro , such as nausea, constipation, diarrhea, or changes in vision. She also denies any mood changes.  She has a history of type two diabetes and is taking Mounjaro  and metformin . Her last recorded A1c was 6.1, with a previous value of 5.8 in December. She is also on Lipitor for hyperlipidemia and has been on it for a long time.  Recently, she experienced itching on her arms after consuming a large quantity of strawberries, which she has not been allergic to in the past. The itching occurs after sun exposure and is localized to her arms, which become red upon returning indoors.She is going to monitor this and seek care if needed.   OBJECTIVE: Visit Diagnoses: Problem List Items Addressed This Visit     Mood disorder (HCC), emotional eating   Other Visit Diagnoses       Type 2 diabetes without long term current  insulin  use    -  Primary   Relevant  Medications   tirzepatide  (MOUNJARO ) 10 MG/0.5ML Pen     Hyperlipidemia associated with type 2 diabetes mellitus (HCC)       Relevant Medications   tirzepatide  (MOUNJARO ) 10 MG/0.5ML Pen     Generalized obesity- Start BMI 34.33       Relevant Medications   tirzepatide  (MOUNJARO ) 10 MG/0.5ML Pen     BMI 30.0-30.9,adult Current BMI 30.8         Obesity Obesity management is progressing well with a total weight loss of 21 pounds. Current regimen includes Mounjaro  10 mg weekly, a category two meal plan, and regular exercise including Pilates and walking. No side effects reported from Mounjaro . Discussed maintaining current weight and potential dose adjustment if weight loss continues beyond desired goal to avoid becoming unhealthy. Goal weight of 175 lbs.  - Continue Mounjaro  10 mg weekly - Maintain current diet and exercise regimen - Consider dose adjustment of Mounjaro  if weight loss continues beyond goal - Monitor weight and adjust Mounjaro  frequency if necessary  Type 2 Diabetes Mellitus Type 2 diabetes is well-managed with current treatment. A1c was 6.1, previously 5.8 in December. Current regimen includes Mounjaro  and metformin . Gaining muscle mass is expected to aid in maintaining weight loss and improving diabetes control. She is working  on nutrition plan to decrease simple carbohydrates, increase lean proteins and exercise to promote weight loss and improve  glycemic control .  - Continue metformin  and Mounjaro - only needs refill for Mounjaro .  - Monitor A1c levels in the future- May recheck in July or August.  Meds ordered this encounter  Medications   tirzepatide  (MOUNJARO ) 10 MG/0.5ML Pen    Sig: Inject 10 mg into the skin once a week.    Dispense:  2 mL    Refill:  0     Emotional Eating Emotional eating is being managed as part of the obesity treatment plan. She is following a structured meal plan and engaging in regular physical activity. On Wellbutrin  SR 150 mg daily.  No side effects.  Continue wellbutrin  SR 150 mg daily. No refill needed this visit.  - Continue following structured meal plan - Continue regular physical activity   Hyperlipidemia LDL is not at goal. Medication(s): lipitor 40 mg daily. No side effects. Some recent rash on arms after sun exposure, but possibly due to eating strawberries. Monitor.  Cardiovascular risk factors: diabetes mellitus and dyslipidemia  Lab Results  Component Value Date   CHOL 159 07/27/2023   HDL 42 07/27/2023   LDLCALC 101 (H) 07/27/2023   LDLDIRECT 85 06/17/2021   TRIG 86 07/27/2023   CHOLHDL 4.2 01/12/2022   CHOLHDL 3.6 06/17/2021   CHOLHDL 4.4 02/17/2021   Lab Results  Component Value Date   ALT 34 (H) 07/27/2023   AST 25 07/27/2023   ALKPHOS 122 (H) 07/27/2023   BILITOT 0.8 07/27/2023   The 10-year ASCVD risk score (Arnett DK, et al., 2019) is: 7.7%   Values used to calculate the score:     Age: 6 years     Sex: Female     Is Non-Hispanic African American: Yes     Diabetic: Yes     Tobacco smoker: No     Systolic Blood Pressure: 108 mmHg     Is BP treated: No     HDL Cholesterol: 42 mg/dL     Total Cholesterol: 159 mg/dL  Plan: Continue statin. On Mounjaro  which should also lower CV risks.  Continue to work on nutrition plan -decreasing simple carbohydrates, increasing lean proteins, decreasing saturated fats and cholesterol , avoiding trans fats and exercise as able to promote weight loss, improve lipids and decrease cardiovascular risks. May want to consider other cholesterol lowering medications if next lipid panel does not show improvement in LDL.      Vitals Temp: 98.1 F (36.7 C) BP: 108/70 Pulse Rate: 89 SpO2: 98 %   Anthropometric Measurements Height: 5\' 4"  (1.626 m) Weight: 179 lb (81.2 kg) BMI (Calculated): 30.71 Weight at Last Visit: 184 lb Weight Lost Since Last Visit: 5 lb Weight Gained Since Last Visit: 0 Starting Weight: 200 lb Total Weight Loss (lbs): 21  lb (9.526 kg) Peak Weight: 220 lb   Body Composition  Body Fat %: 38 % Fat Mass (lbs): 68.2 lbs Muscle Mass (lbs): 105.8 lbs Total Body Water (lbs): 74 lbs Visceral Fat Rating : 10   Other Clinical Data Fasting: No Labs: No Today's Visit #: 41 Starting Date: 10/27/20     ASSESSMENT AND PLAN:  Diet: Mena is currently in the action stage of change. As such, her goal is to continue with weight loss efforts. She has agreed to Category 2 Plan and B/L options.  Exercise: Manon has been instructed to continue exercising as is for weight loss and overall health benefits.   Behavior Modification:  We discussed the following Behavioral Modification Strategies today: increasing lean protein intake, decreasing  simple carbohydrates, increasing vegetables, increase H2O intake, increase high fiber foods, meal planning and cooking strategies, avoiding temptations, and planning for success. We discussed various medication options to help Marillyn with her weight loss efforts and we both agreed to continue current plan and she is nearing her goal and likely maintenance weight range. We will discuss strategies for Mounjaro  dosing and frequency to maintain her A1c/glycemic control and maintain weight loss .  Return in about 4 weeks (around 11/08/2023).Aaron Aas She was informed of the importance of frequent follow up visits to maximize her success with intensive lifestyle modifications for her multiple health conditions.  Attestation Statements:   Reviewed by clinician on day of visit: allergies, medications, problem list, medical history, surgical history, family history, social history, and previous encounter notes.   Time spent on visit including pre-visit chart review and post-visit care and charting was 28 minutes.    Damon Hargrove, PA-C

## 2023-10-11 ENCOUNTER — Encounter (INDEPENDENT_AMBULATORY_CARE_PROVIDER_SITE_OTHER): Payer: Self-pay | Admitting: Physician Assistant

## 2023-10-11 ENCOUNTER — Other Ambulatory Visit (HOSPITAL_COMMUNITY): Payer: Self-pay

## 2023-10-11 ENCOUNTER — Ambulatory Visit (INDEPENDENT_AMBULATORY_CARE_PROVIDER_SITE_OTHER): Admitting: Physician Assistant

## 2023-10-11 VITALS — BP 108/70 | HR 89 | Temp 98.1°F | Ht 64.0 in | Wt 179.0 lb

## 2023-10-11 DIAGNOSIS — E1169 Type 2 diabetes mellitus with other specified complication: Secondary | ICD-10-CM | POA: Diagnosis not present

## 2023-10-11 DIAGNOSIS — E785 Hyperlipidemia, unspecified: Secondary | ICD-10-CM | POA: Diagnosis not present

## 2023-10-11 DIAGNOSIS — Z7985 Long-term (current) use of injectable non-insulin antidiabetic drugs: Secondary | ICD-10-CM | POA: Diagnosis not present

## 2023-10-11 DIAGNOSIS — F39 Unspecified mood [affective] disorder: Secondary | ICD-10-CM | POA: Diagnosis not present

## 2023-10-11 DIAGNOSIS — E1165 Type 2 diabetes mellitus with hyperglycemia: Secondary | ICD-10-CM

## 2023-10-11 DIAGNOSIS — Z6830 Body mass index (BMI) 30.0-30.9, adult: Secondary | ICD-10-CM | POA: Diagnosis not present

## 2023-10-11 DIAGNOSIS — E669 Obesity, unspecified: Secondary | ICD-10-CM | POA: Diagnosis not present

## 2023-10-11 DIAGNOSIS — F5089 Other specified eating disorder: Secondary | ICD-10-CM

## 2023-10-11 MED ORDER — TIRZEPATIDE 10 MG/0.5ML ~~LOC~~ SOAJ
10.0000 mg | SUBCUTANEOUS | 0 refills | Status: DC
  Filled 2023-10-11: qty 2, 28d supply, fill #0

## 2023-10-16 ENCOUNTER — Other Ambulatory Visit (HOSPITAL_COMMUNITY): Payer: Self-pay

## 2023-10-16 ENCOUNTER — Encounter (HOSPITAL_COMMUNITY): Payer: Self-pay | Admitting: Pharmacist

## 2023-11-07 NOTE — Progress Notes (Unsigned)
   SUBJECTIVE: Discussed the use of AI scribe software for clinical note transcription with the patient, who gave verbal consent to proceed.  Chief Complaint: Obesity  Interim History: ***  Victoria Garcia is here to discuss her progress with her obesity treatment plan. She is on the Category 2 Plan and states she is following her eating plan approximately 75 % of the time. She states she is exercising 60 minutes 2-3 times per week.   OBJECTIVE: Visit Diagnoses: Problem List Items Addressed This Visit     Vitamin D  deficiency   Mood disorder (HCC), emotional eating   Other Visit Diagnoses       Type 2 diabetes without long term current  insulin  use    -  Primary     Hyperlipidemia associated with type 2 diabetes mellitus (HCC)         Generalized obesity- Start BMI 34.33           Vitals Temp: 99 F (37.2 C) BP: 118/77 Pulse Rate: 92 SpO2: 98 %   Anthropometric Measurements Height: 5' 4 (1.626 m) Weight: 178 lb (80.7 kg) BMI (Calculated): 30.54 Weight at Last Visit: 179 lb Weight Lost Since Last Visit: 1 lb Weight Gained Since Last Visit: 0 Starting Weight: 200 lb Total Weight Loss (lbs): 22 lb (9.979 kg) Peak Weight: 220 lb   Body Composition  Body Fat %: 31.3 % Fat Mass (lbs): 55.8 lbs Muscle Mass (lbs): 116.2 lbs Total Body Water (lbs): 77.4 lbs Visceral Fat Rating : 8   Other Clinical Data Fasting: No Labs: No Today's Visit #: 42 Starting Date: 10/27/20     ASSESSMENT AND PLAN:  Diet: Victoria Garcia {CHL AMB IS/IS NOT:210130109} currently in the action stage of change. As such, her goal is to {HWW Weight Loss Efforts:210964006}. She {HAS HAS ZOX:09604} agreed to {HWW Weight Loss Plan:210964005}.  Exercise: Victoria Garcia has been instructed {HWW Exercise:210964007} for weight loss and overall health benefits.   Behavior Modification:  We discussed the following Behavioral Modification Strategies today: {HWW Behavior Modification:210964008}. We discussed various  medication options to help Victoria Garcia with her weight loss efforts and we both agreed to ***.  No follow-ups on file.Aaron Aas She was informed of the importance of frequent follow up visits to maximize her success with intensive lifestyle modifications for her multiple health conditions.  Attestation Statements:   Reviewed by clinician on day of visit: allergies, medications, problem list, medical history, surgical history, family history, social history, and previous encounter notes.   Time spent on visit including pre-visit chart review and post-visit care and charting was *** minutes.    Victoria Chay, PA-C

## 2023-11-08 ENCOUNTER — Ambulatory Visit (INDEPENDENT_AMBULATORY_CARE_PROVIDER_SITE_OTHER): Admitting: Physician Assistant

## 2023-11-08 ENCOUNTER — Other Ambulatory Visit (HOSPITAL_COMMUNITY): Payer: Self-pay

## 2023-11-08 ENCOUNTER — Encounter (INDEPENDENT_AMBULATORY_CARE_PROVIDER_SITE_OTHER): Payer: Self-pay | Admitting: Physician Assistant

## 2023-11-08 VITALS — BP 118/77 | HR 92 | Temp 99.0°F | Ht 64.0 in | Wt 178.0 lb

## 2023-11-08 DIAGNOSIS — Z6830 Body mass index (BMI) 30.0-30.9, adult: Secondary | ICD-10-CM

## 2023-11-08 DIAGNOSIS — E559 Vitamin D deficiency, unspecified: Secondary | ICD-10-CM

## 2023-11-08 DIAGNOSIS — E1169 Type 2 diabetes mellitus with other specified complication: Secondary | ICD-10-CM | POA: Diagnosis not present

## 2023-11-08 DIAGNOSIS — F5089 Other specified eating disorder: Secondary | ICD-10-CM | POA: Diagnosis not present

## 2023-11-08 DIAGNOSIS — E785 Hyperlipidemia, unspecified: Secondary | ICD-10-CM | POA: Diagnosis not present

## 2023-11-08 DIAGNOSIS — Z7984 Long term (current) use of oral hypoglycemic drugs: Secondary | ICD-10-CM

## 2023-11-08 DIAGNOSIS — F39 Unspecified mood [affective] disorder: Secondary | ICD-10-CM

## 2023-11-08 DIAGNOSIS — E1165 Type 2 diabetes mellitus with hyperglycemia: Secondary | ICD-10-CM | POA: Diagnosis not present

## 2023-11-08 DIAGNOSIS — E669 Obesity, unspecified: Secondary | ICD-10-CM

## 2023-11-08 DIAGNOSIS — Z7985 Long-term (current) use of injectable non-insulin antidiabetic drugs: Secondary | ICD-10-CM

## 2023-11-08 MED ORDER — TIRZEPATIDE 10 MG/0.5ML ~~LOC~~ SOAJ
10.0000 mg | SUBCUTANEOUS | 1 refills | Status: DC
  Filled 2023-11-08: qty 2, 28d supply, fill #0

## 2023-11-10 ENCOUNTER — Other Ambulatory Visit (HOSPITAL_COMMUNITY): Payer: Self-pay

## 2023-12-06 ENCOUNTER — Ambulatory Visit (INDEPENDENT_AMBULATORY_CARE_PROVIDER_SITE_OTHER): Admitting: Physician Assistant

## 2023-12-06 ENCOUNTER — Other Ambulatory Visit: Payer: Self-pay

## 2023-12-06 ENCOUNTER — Other Ambulatory Visit (HOSPITAL_COMMUNITY): Payer: Self-pay

## 2023-12-06 ENCOUNTER — Encounter (INDEPENDENT_AMBULATORY_CARE_PROVIDER_SITE_OTHER): Payer: Self-pay | Admitting: Physician Assistant

## 2023-12-06 VITALS — BP 124/82 | HR 95 | Temp 98.4°F | Ht 64.0 in | Wt 173.0 lb

## 2023-12-06 DIAGNOSIS — Z6829 Body mass index (BMI) 29.0-29.9, adult: Secondary | ICD-10-CM

## 2023-12-06 DIAGNOSIS — F39 Unspecified mood [affective] disorder: Secondary | ICD-10-CM

## 2023-12-06 DIAGNOSIS — Z7984 Long term (current) use of oral hypoglycemic drugs: Secondary | ICD-10-CM | POA: Diagnosis not present

## 2023-12-06 DIAGNOSIS — E1165 Type 2 diabetes mellitus with hyperglycemia: Secondary | ICD-10-CM | POA: Diagnosis not present

## 2023-12-06 DIAGNOSIS — E669 Obesity, unspecified: Secondary | ICD-10-CM | POA: Diagnosis not present

## 2023-12-06 DIAGNOSIS — Z7985 Long-term (current) use of injectable non-insulin antidiabetic drugs: Secondary | ICD-10-CM

## 2023-12-06 DIAGNOSIS — E1169 Type 2 diabetes mellitus with other specified complication: Secondary | ICD-10-CM

## 2023-12-06 DIAGNOSIS — E559 Vitamin D deficiency, unspecified: Secondary | ICD-10-CM

## 2023-12-06 MED ORDER — METFORMIN HCL 500 MG PO TABS
500.0000 mg | ORAL_TABLET | Freq: Every day | ORAL | 0 refills | Status: DC
  Filled 2023-12-06: qty 30, 30d supply, fill #0

## 2023-12-06 MED ORDER — BUPROPION HCL ER (SR) 150 MG PO TB12
150.0000 mg | ORAL_TABLET | Freq: Two times a day (BID) | ORAL | 1 refills | Status: DC
  Filled 2023-12-06: qty 60, 30d supply, fill #0

## 2023-12-06 MED ORDER — TIRZEPATIDE 10 MG/0.5ML ~~LOC~~ SOAJ
10.0000 mg | SUBCUTANEOUS | 1 refills | Status: DC
  Filled 2023-12-06: qty 2, 28d supply, fill #0

## 2023-12-06 NOTE — Progress Notes (Signed)
 SUBJECTIVE: Discussed the use of AI scribe software for clinical note transcription with the patient, who gave verbal consent to proceed.  Chief Complaint: Obesity  Interim History: She is down 5 lbs since last visit.  Down 28 lbs overall TBW loss of 14%  Victoria Garcia is here to discuss her progress with her obesity treatment plan. She is on the Category 2 Plan and states she is following her eating plan approximately 80 % of the time. She states she is exercising pilates 2/walking 1 60 minutes 3 times per week. Victoria Garcia is a 63 year old female with obesity who presents for follow-up of her obesity treatment plan.  She has been on Mounjaro  10 mg weekly, resulting in a total weight loss of 28 pounds, which is 14% of her body weight. She reports no side effects from Mounjaro , including nausea, vomiting, diarrhea, constipation, difficulty swallowing, changes in vision, or mood changes.  She has a history of type 2 diabetes and is currently taking metformin  500 mg once daily with no gastrointestinal side effects. She takes it at lunchtime.  She is also on Wellbutrin  SR 150 mg once daily, which she has recently increased to twice daily due to work-related mood changes. She finds that taking Wellbutrin  twice a day helps with her mood and productivity.  Her exercise routine includes Pilates twice a week and walking once a week, totaling 60 minutes of exercise three times a week. She is working with a Systems analyst and has seen improvements in her body composition, with a decrease in adipose tissue and maintenance of visceral adipose rating within goal.  She discusses work-related stress, noting that she often has to work beyond her scheduled hours due to staffing issues, which impacts her work-life balance. She has reduced her workdays to three per week to manage stress better.  OBJECTIVE: Visit Diagnoses: Problem List Items Addressed This Visit     Vitamin D  deficiency   Mood disorder  (HCC), emotional eating   Relevant Medications   buPROPion  (WELLBUTRIN  SR) 150 MG 12 hr tablet   Other Visit Diagnoses       Type 2 diabetes without long term current  insulin  use    -  Primary   Relevant Medications   tirzepatide  (MOUNJARO ) 10 MG/0.5ML Pen   metFORMIN  (GLUCOPHAGE ) 500 MG tablet     Hyperlipidemia associated with type 2 diabetes mellitus (HCC)       Relevant Medications   tirzepatide  (MOUNJARO ) 10 MG/0.5ML Pen   metFORMIN  (GLUCOPHAGE ) 500 MG tablet     Generalized obesity- Start BMI 34.33       Relevant Medications   tirzepatide  (MOUNJARO ) 10 MG/0.5ML Pen   metFORMIN  (GLUCOPHAGE ) 500 MG tablet     BMI 29.0-29.9,adult Current BMI 29.8         Obesity Victoria Garcia is undergoing treatment for obesity with Mounjaro  10 mg weekly, resulting in a 14% weight reduction (28 pounds). She reports no adverse effects such as nausea, vomiting, diarrhea, constipation, dysphagia, visual changes, or mood alterations. Her current weight is 173 pounds, with a target range of 170 to 175 pounds. The goal is to decrease her body adipose percentage to 35%, currently at 36%. - Continue Mounjaro  10 mg weekly on Mondays - Monitor weight and body adipose percentage - Encourage continuation of exercise regimen: Pilates twice a week and walking once a week - Consider reducing Mounjaro  dose after further weight loss  Emotional Eating Work-related stress may contribute to this behavior. Wellbutrin  aids in  mood and productivity management. - Increase/refill Wellbutrin  SR to 150 mg twice daily, ensuring the second dose is not taken after 2 PM to avoid sleep interference  Type 2 Diabetes Mellitus Victoria Garcia is on metformin  500 mg once daily for type 2 diabetes, with no gastrointestinal side effects. A follow-up A1c test is planned for August or September. - Continue/refill metformin  500 mg once daily Continue/refill Mounjaro  10 mg weekly - Schedule fasting labs in September to check A1c levels Meds  ordered this encounter  Medications   tirzepatide  (MOUNJARO ) 10 MG/0.5ML Pen    Sig: Inject 10 mg into the skin once a week.    Dispense:  2 mL    Refill:  1   buPROPion  (WELLBUTRIN  SR) 150 MG 12 hr tablet    Sig: Take 1 tablet (150 mg total) by mouth 2 (two) times daily.    Dispense:  60 tablet    Refill:  1    Ov for rf   metFORMIN  (GLUCOPHAGE ) 500 MG tablet    Sig: Take 1 tablet (500 mg total) by mouth daily.    Dispense:  30 tablet    Refill:  0      General Health Maintenance Victoria Garcia engages in regular exercise, including Pilates and walking, and works with a Systems analyst. She maintains a balanced diet, even during vacations. - Encourage continuation of current exercise regimen - Advise on maintaining a balanced diet, focusing on protein intake and moderation during vacations  Follow-up A follow-up appointment is scheduled to assess progress and conduct fasting labs. - Schedule follow-up appointment on Thursday, September 11th at 10:40 AM with Dr. MALVA for fasting labs  Vitals Temp: 98.4 F (36.9 C) BP: 124/82 Pulse Rate: 95 SpO2: 98 %   Anthropometric Measurements Height: 5' 4 (1.626 m) Weight: 173 lb (78.5 kg) BMI (Calculated): 29.68 Weight at Last Visit: 178 lb Weight Lost Since Last Visit: 5lb Weight Gained Since Last Visit: 0lb Starting Weight: 200 lb Total Weight Loss (lbs): 28 lb (12.7 kg) Peak Weight: 220 lb   Body Composition  Body Fat %: 36 % Fat Mass (lbs): 62.6 lbs Muscle Mass (lbs): 105.6 lbs Total Body Water (lbs): 73.6 lbs Visceral Fat Rating : 9   Other Clinical Data Fasting: No Labs: No Today's Visit #: 42 Starting Date: 10/27/20     ASSESSMENT AND PLAN:  Diet: Victoria Garcia is currently in the action stage of change. As such, her goal is to continue with weight loss efforts. She has agreed to Category 2 Plan.  Exercise: Victoria Garcia has been instructed to continue exercising as is for weight loss and overall health  benefits.   Behavior Modification:  We discussed the following Behavioral Modification Strategies today: increasing lean protein intake, decreasing simple carbohydrates, increasing vegetables, increase H2O intake, increase high fiber foods, no skipping meals, meal planning and cooking strategies, emotional eating strategies , avoiding temptations, and planning for success. We discussed various medication options to help Victoria Garcia with her weight loss efforts and we both agreed to continue current treatment plan.  Return in about 4 weeks (around 01/03/2024).Victoria Garcia She was informed of the importance of frequent follow up visits to maximize her success with intensive lifestyle modifications for her multiple health conditions.  Attestation Statements:   Reviewed by clinician on day of visit: allergies, medications, problem list, medical history, surgical history, family history, social history, and previous encounter notes.   Time spent on visit including pre-visit chart review and post-visit care and charting was 25 minutes.  Victoria Kissoon, PA-C

## 2024-01-02 NOTE — Progress Notes (Signed)
 SUBJECTIVE: Discussed the use of AI scribe software for clinical note transcription with the patient, who gave verbal consent to proceed.  Chief Complaint: Obesity  Interim History: She is down 3 lbs since her last visit Down 30 lbs overall TBW loss of 15%  Evadene is here to discuss her progress with her obesity treatment plan. She is on the Category 2 Plan and states she is following her eating plan approximately 80 % of the time. She states she is exercising pilates 45-60 minutes 3 times per week (and walking 10K steps daily) . COUTNEY WILDERMUTH is a 63 year old female with obesity who presents for follow-up of her obesity treatment plan.  She has successfully lost a total of thirty pounds, with an additional three pounds lost since her last visit.  Her current BMI is 29.3, and her adipose percentage has decreased to 35.8%.   She continues to engage in weight training and Pilates, which she attributes to feeling stronger and having improved joint function over the past six months. She experiences knee achiness when climbing stairs and is working on strengthening her quadriceps and glutes to address this.  She has a history of type 2 diabetes and is currently taking Mounjaro  10 mg weekly and metformin  500 mg once daily, and reports no adverse effects. Denies mass in neck, dysphagia, dyspepsia, persistent hoarseness, abdominal pain, or N/V/Constipation or diarrhea. Has annual eye exam. Mood is stable.    For her hyperlipidemia, she is on Lipitor 40 mg daily and reports no muscle soreness or other side effects. This is prescribed by Dr. Zheng.   She is also taking bupropion  150 mg twice daily for emotional eating and finds it necessary to maintain this dosage. She reports no changes in mood or other side effects.  She typically gets six to seven hours of sleep per night, which she finds adequate, although she occasionally wakes up during the night but is able to fall back asleep. She works a  job that sometimes requires her to stay late, but this is infrequent.    AWV with PCP in Sept and to have labs done then   Pharmacotherapy: Mounjaro  10 mg weekly and metformin  500 mg daily for Type 2 DM    Wellbutrin  for EEB Last IC 07/27/23 REE 1858 OBJECTIVE: Visit Diagnoses: Problem List Items Addressed This Visit     Vitamin D  deficiency   Mood disorder (HCC), emotional eating   Relevant Medications   buPROPion  (WELLBUTRIN  SR) 150 MG 12 hr tablet   Other Visit Diagnoses       Type 2 diabetes without long term current  insulin  use    -  Primary   Relevant Medications   tirzepatide  (MOUNJARO ) 10 MG/0.5ML Pen   metFORMIN  (GLUCOPHAGE ) 500 MG tablet     Hyperlipidemia associated with type 2 diabetes mellitus (HCC)       Relevant Medications   tirzepatide  (MOUNJARO ) 10 MG/0.5ML Pen   metFORMIN  (GLUCOPHAGE ) 500 MG tablet     Generalized obesity- Start BMI 34.33       Relevant Medications   tirzepatide  (MOUNJARO ) 10 MG/0.5ML Pen   metFORMIN  (GLUCOPHAGE ) 500 MG tablet     BMI 29.0-29.9,adult Current BMI 29.3         Obesity Obesity management is ongoing with significant progress. She has lost a total of 30 pounds, with an additional 3 pounds lost since the last visit. Her current BMI is 29.3, indicating a transition from obesity to overweight. Her adipose percentage  is down to 35.8%, with a goal of 35%. She is actively engaged in weight training and Pilates, contributing to her weight loss and improved lean mass. No adverse effects reported from Mounjaro , which is part of her weight management regimen. - Continue Mounjaro  10 mg weekly for primary indication of T2DM - Encourage continuation of weight training and Pilates - Reinforce dietary and lifestyle modifications  Type 2 diabetes mellitus Type 2 diabetes is being managed with Mounjaro  and metformin . The combination is effective in maintaining glycemic control by improving insulin  sensitivity. No issues reported with metformin ,  and it is beneficial in conjunction with Mounjaro . - Continue/refill metformin  500 mg once daily - Continue/refill Mounjaro  10 mg weekly -She is working  on nutrition plan to decrease simple carbohydrates, increase lean proteins and exercise to promote weight loss and improve glycemic control .  Hyperlipidemia Hyperlipidemia is being managed with Lipitor. She reports no muscle soreness or other side effects from the medication, indicating good tolerance. LDL not at goal. HDL just above goal. Trig at goal.  Last lipids Lab Results  Component Value Date   CHOL 159 07/27/2023   HDL 42 07/27/2023   LDLCALC 101 (H) 07/27/2023   LDLDIRECT 85 06/17/2021   TRIG 86 07/27/2023   CHOLHDL 4.2 01/12/2022  Continue to work on nutrition plan -decreasing simple carbohydrates, increasing lean proteins, decreasing saturated fats and cholesterol , avoiding trans fats and exercise as able to promote weight loss, improve lipids and decrease cardiovascular risks. - Continue Lipitor 40 mg daily -  Recheck labs with PCP at AWV  Emotional eating Emotional eating is being managed with bupropion , which is taken twice daily. She feels the current dosage is necessary and effective in managing her symptoms, indicating a need for continued use. - Continue bupropion  150 mg twice daily - COntinue to work on stress reduction/emotional eating strategies.  Vitals Temp: 98.3 F (36.8 C) BP: 113/71 Pulse Rate: 84 SpO2: 98 %   Anthropometric Measurements Height: 5' 4 (1.626 m) Weight: 170 lb (77.1 kg) BMI (Calculated): 29.17 Weight at Last Visit: 173 lb Weight Lost Since Last Visit: 3 lb Weight Gained Since Last Visit: 0 Starting Weight: 200 lb Total Weight Loss (lbs): 30 lb (13.6 kg) Peak Weight: 220 lb   Body Composition  Body Fat %: 35.8 % Fat Mass (lbs): 61 lbs Muscle Mass (lbs): 104 lbs Total Body Water (lbs): 73.4 lbs Visceral Fat Rating : 9   Other Clinical Data Fasting: no Labs: no Today's  Visit #: 44 Starting Date: 10/27/20     ASSESSMENT AND PLAN:  Diet: Nanami is currently in the action stage of change. As such, her goal is to continue with weight loss efforts. She has agreed to Category 2 Plan.  Exercise: Cara has been instructed to continue exercising as is for weight loss and overall health benefits.   Behavior Modification:  We discussed the following Behavioral Modification Strategies today: increasing lean protein intake, decreasing simple carbohydrates, increasing vegetables, increase H2O intake, increase high fiber foods, meal planning and cooking strategies, emotional eating strategies , avoiding temptations, and planning for success. We discussed various medication options to help Gaige with her weight loss efforts and we both agreed to continue current treatment plan.  Return in about 4 weeks (around 01/31/2024).SABRA She was informed of the importance of frequent follow up visits to maximize her success with intensive lifestyle modifications for her multiple health conditions.  Attestation Statements:   Reviewed by clinician on day of visit: allergies, medications,  problem list, medical history, surgical history, family history, social history, and previous encounter notes.   Time spent on visit including pre-visit chart review and post-visit care and charting was 26 minutes.    Deovion Batrez, PA-C

## 2024-01-03 ENCOUNTER — Encounter (INDEPENDENT_AMBULATORY_CARE_PROVIDER_SITE_OTHER): Payer: Self-pay | Admitting: Physician Assistant

## 2024-01-03 ENCOUNTER — Other Ambulatory Visit (HOSPITAL_COMMUNITY): Payer: Self-pay

## 2024-01-03 ENCOUNTER — Ambulatory Visit (INDEPENDENT_AMBULATORY_CARE_PROVIDER_SITE_OTHER): Admitting: Physician Assistant

## 2024-01-03 VITALS — BP 113/71 | HR 84 | Temp 98.3°F | Ht 64.0 in | Wt 170.0 lb

## 2024-01-03 DIAGNOSIS — Z7984 Long term (current) use of oral hypoglycemic drugs: Secondary | ICD-10-CM | POA: Diagnosis not present

## 2024-01-03 DIAGNOSIS — E785 Hyperlipidemia, unspecified: Secondary | ICD-10-CM

## 2024-01-03 DIAGNOSIS — F5089 Other specified eating disorder: Secondary | ICD-10-CM | POA: Diagnosis not present

## 2024-01-03 DIAGNOSIS — E1165 Type 2 diabetes mellitus with hyperglycemia: Secondary | ICD-10-CM | POA: Diagnosis not present

## 2024-01-03 DIAGNOSIS — E1169 Type 2 diabetes mellitus with other specified complication: Secondary | ICD-10-CM | POA: Diagnosis not present

## 2024-01-03 DIAGNOSIS — Z6829 Body mass index (BMI) 29.0-29.9, adult: Secondary | ICD-10-CM

## 2024-01-03 DIAGNOSIS — E669 Obesity, unspecified: Secondary | ICD-10-CM

## 2024-01-03 DIAGNOSIS — F39 Unspecified mood [affective] disorder: Secondary | ICD-10-CM

## 2024-01-03 DIAGNOSIS — Z7985 Long-term (current) use of injectable non-insulin antidiabetic drugs: Secondary | ICD-10-CM

## 2024-01-03 DIAGNOSIS — E559 Vitamin D deficiency, unspecified: Secondary | ICD-10-CM

## 2024-01-03 MED ORDER — METFORMIN HCL 500 MG PO TABS
500.0000 mg | ORAL_TABLET | Freq: Every day | ORAL | 0 refills | Status: DC
  Filled 2024-01-03 (×2): qty 30, 30d supply, fill #0

## 2024-01-03 MED ORDER — TIRZEPATIDE 10 MG/0.5ML ~~LOC~~ SOAJ
10.0000 mg | SUBCUTANEOUS | 1 refills | Status: DC
  Filled 2024-01-03 (×2): qty 2, 28d supply, fill #0

## 2024-01-03 MED ORDER — BUPROPION HCL ER (SR) 150 MG PO TB12
150.0000 mg | ORAL_TABLET | Freq: Two times a day (BID) | ORAL | 1 refills | Status: DC
  Filled 2024-01-03 (×2): qty 60, 30d supply, fill #0

## 2024-01-12 DIAGNOSIS — Z1231 Encounter for screening mammogram for malignant neoplasm of breast: Secondary | ICD-10-CM | POA: Diagnosis not present

## 2024-01-12 LAB — HM MAMMOGRAPHY

## 2024-01-19 ENCOUNTER — Encounter: Payer: Self-pay | Admitting: Student

## 2024-02-01 ENCOUNTER — Ambulatory Visit (INDEPENDENT_AMBULATORY_CARE_PROVIDER_SITE_OTHER): Admitting: Family Medicine

## 2024-02-01 ENCOUNTER — Other Ambulatory Visit (HOSPITAL_COMMUNITY): Payer: Self-pay

## 2024-02-01 ENCOUNTER — Encounter (INDEPENDENT_AMBULATORY_CARE_PROVIDER_SITE_OTHER): Payer: Self-pay | Admitting: Family Medicine

## 2024-02-01 VITALS — BP 112/67 | HR 79 | Temp 97.9°F | Ht 64.0 in | Wt 168.0 lb

## 2024-02-01 DIAGNOSIS — F39 Unspecified mood [affective] disorder: Secondary | ICD-10-CM

## 2024-02-01 DIAGNOSIS — E669 Obesity, unspecified: Secondary | ICD-10-CM

## 2024-02-01 DIAGNOSIS — Z7984 Long term (current) use of oral hypoglycemic drugs: Secondary | ICD-10-CM

## 2024-02-01 DIAGNOSIS — E119 Type 2 diabetes mellitus without complications: Secondary | ICD-10-CM

## 2024-02-01 DIAGNOSIS — Z7985 Long-term (current) use of injectable non-insulin antidiabetic drugs: Secondary | ICD-10-CM

## 2024-02-01 DIAGNOSIS — Z6834 Body mass index (BMI) 34.0-34.9, adult: Secondary | ICD-10-CM | POA: Insufficient documentation

## 2024-02-01 DIAGNOSIS — Z6828 Body mass index (BMI) 28.0-28.9, adult: Secondary | ICD-10-CM | POA: Diagnosis not present

## 2024-02-01 DIAGNOSIS — F509 Eating disorder, unspecified: Secondary | ICD-10-CM

## 2024-02-01 DIAGNOSIS — E1165 Type 2 diabetes mellitus with hyperglycemia: Secondary | ICD-10-CM | POA: Insufficient documentation

## 2024-02-01 MED ORDER — TIRZEPATIDE 10 MG/0.5ML ~~LOC~~ SOAJ
10.0000 mg | SUBCUTANEOUS | 1 refills | Status: DC
  Filled 2024-02-01: qty 2, 28d supply, fill #0

## 2024-02-01 MED ORDER — BUPROPION HCL ER (SR) 150 MG PO TB12
150.0000 mg | ORAL_TABLET | Freq: Two times a day (BID) | ORAL | 1 refills | Status: DC
  Filled 2024-02-01: qty 60, 30d supply, fill #0

## 2024-02-01 MED ORDER — METFORMIN HCL 500 MG PO TABS
500.0000 mg | ORAL_TABLET | Freq: Every day | ORAL | 0 refills | Status: DC
  Filled 2024-02-01: qty 30, 30d supply, fill #0

## 2024-02-01 NOTE — Progress Notes (Signed)
 Office: [phone]  /  Fax: [phone]  WEIGHT SUMMARY AND BIOMETRICS  Anthropometric Measurements Height: 5' 4 (1.626 m) Weight: 168 lb (76.2 kg) BMI (Calculated): 28.82 Weight at Last Visit: 170 lb Weight Lost Since Last Visit: 2 lb Weight Gained Since Last Visit: 0 Starting Weight: 200 lb Total Weight Loss (lbs): 32 lb (14.5 kg) Peak Weight: 220 lb   Body Composition  Body Fat %: 35.6 % Fat Mass (lbs): 59.8 lbs Muscle Mass (lbs): 102.8 lbs Total Body Water (lbs): 71.8 lbs Visceral Fat Rating : 9   Other Clinical Data Fasting: no Labs: no Today's Visit #: 45 Starting Date: 10/27/20    Chief Complaint: OBESITY    History of Present Illness Victoria Garcia is a 63 year old female who presents for obesity treatment and progress assessment.  She is following a category two eating plan, adhering to it approximately 80% of the time. She exercises three days a week for 60 minutes, engaging in both Pilates and walking. Since her last visit a month ago, she has lost two pounds.  Her hunger levels are described as 'pretty good', though she sometimes struggles to consume all components of her meal plan, particularly at dinner. Dinner is typically between 7 and 8 PM, and she goes to bed around 11:30 PM. She has reduced snacking between dinner and bedtime, but acknowledges that snacking from home to dinner has improved.  Her current medications include metformin , which she takes with her lunch meal, and Mounjaro  at a dose of 10 mg. No nausea or constipation, and she takes two fiber gummies daily. She also takes Wellbutrin , both doses in the morning to help manage her interactions at work, and occasionally adjusts her intake based on her environment, such as taking less during a vacation when her anxiety is lower.      PHYSICAL EXAM:  Blood pressure 112/67, pulse 79, temperature 97.9 F (36.6 C), height 5' 4 (1.626 m), weight 168 lb (76.2 kg), SpO2 100%. Body mass  index is 28.84 kg/m.  DIAGNOSTIC DATA REVIEWED:  BMET    Component Value Date/Time   NA 140 07/27/2023 0754   K 4.5 07/27/2023 0754   CL 101 07/27/2023 0754   CO2 25 07/27/2023 0754   GLUCOSE 94 07/27/2023 0754   GLUCOSE 98 02/17/2021 0825   BUN 19 07/27/2023 0754   CREATININE 0.77 07/27/2023 0754   CREATININE 0.73 02/17/2021 0825   CALCIUM  9.5 07/27/2023 0754   GFRNONAA 96 08/17/2020 1000   GFRAA 111 08/17/2020 1000   Lab Results  Component Value Date   HGBA1C 6.1 (H) 07/27/2023   HGBA1C 6.5 (H) 05/07/2014   Lab Results  Component Value Date   INSULIN  13.6 07/27/2023   INSULIN  14.3 10/27/2020   Lab Results  Component Value Date   TSH 1.510 01/09/2023   CBC    Component Value Date/Time   WBC 8.2 07/27/2023 0754   WBC 7.0 08/17/2020 1000   RBC 4.57 07/27/2023 0754   RBC 4.48 08/17/2020 1000   HGB 14.0 07/27/2023 0754   HCT 43.1 07/27/2023 0754   PLT 257 07/27/2023 0754   MCV 94 07/27/2023 0754   MCH 30.6 07/27/2023 0754   MCH 30.1 08/17/2020 1000   MCHC 32.5 07/27/2023 0754   MCHC 33.3 08/17/2020 1000   RDW 12.4 07/27/2023 0754   Iron Studies    Component Value Date/Time   IRON 50 09/24/2014 1018   Lipid Panel     Component Value Date/Time  CHOL 159 07/27/2023 0754   TRIG 86 07/27/2023 0754   HDL 42 07/27/2023 0754   CHOLHDL 4.2 01/12/2022 1552   CHOLHDL 4.4 02/17/2021 0825   VLDL 21 01/13/2017 0924   LDLCALC 101 (H) 07/27/2023 0754   LDLCALC 105 (H) 02/17/2021 0825   LDLDIRECT 85 06/17/2021 1026   Hepatic Function Panel     Component Value Date/Time   PROT 7.4 07/27/2023 0754   ALBUMIN 4.2 07/27/2023 0754   AST 25 07/27/2023 0754   ALT 34 (H) 07/27/2023 0754   ALKPHOS 122 (H) 07/27/2023 0754   BILITOT 0.8 07/27/2023 0754      Component Value Date/Time   TSH 1.510 01/09/2023 0833   Nutritional Lab Results  Component Value Date   VD25OH 58.2 07/27/2023   VD25OH 49.8 01/09/2023   VD25OH 55.7 06/13/2022     Assessment and  Plan Assessment & Plan Obesity Following category two with options eating plan 80% of the time and exercises three days a week for 60 minutes, including Pilates and walking. Lost two pounds in the last month. Fat percentage is 35.6%, slightly above the target of 35% for postmenopausal women, but visceral fat is at a healthy level of 9. Above-average muscle mass, beneficial for health. - Continue category two with options eating plan - Continue exercise regimen of Pilates and walking three days a week - Consider having a protein snack in the late afternoon to help with protein intake  Disordered eating Reports following meal plan most of the time but sometimes struggles with dinner intake. Has strategies to manage snacking and is working on maintaining a balanced diet. Wellbutrin  helps manage disordered eating, adjusted intake based on environment and stress levels. - Continue Wellbutrin , taking both doses in the morning - Refill Wellbutrin  prescription - Consider adjusting meal timing and composition to improve adherence to the meal plan  Type 2 diabetes mellitus Managed with metformin  and Mounjaro . Takes metformin  with lunch and is on 10 mg of Mounjaro . Reports no issues with nausea or constipation, takes fiber gummies daily to aid digestion. - Continue metformin  with lunch - Continue Mounjaro  10 mg - Refill metformin  and Mounjaro  prescriptions    Jisela was informed of the importance of frequent follow up visits to maximize her success with intensive lifestyle modifications for her obesity and obesity related health conditions as recommended by USPSTF and CMS guidelines   Louann Penton, MD

## 2024-02-05 ENCOUNTER — Telehealth (INDEPENDENT_AMBULATORY_CARE_PROVIDER_SITE_OTHER): Payer: Self-pay

## 2024-02-05 NOTE — Telephone Encounter (Signed)
 PA for Mounjaro  10MG  has been submitted, awaiting PA questions.

## 2024-02-06 NOTE — Telephone Encounter (Signed)
 Questions submitted to [employer], via cover my meds.  Awaiting determination.

## 2024-02-06 NOTE — Telephone Encounter (Addendum)
 Message from Plan:  Prior Authorization is not required for this medication dosage form and strength at the quantity and days supply requested.  Patient notified via my chart.

## 2024-02-13 ENCOUNTER — Ambulatory Visit (INDEPENDENT_AMBULATORY_CARE_PROVIDER_SITE_OTHER): Payer: Self-pay | Admitting: Student

## 2024-02-13 ENCOUNTER — Encounter: Payer: Self-pay | Admitting: Student

## 2024-02-13 ENCOUNTER — Other Ambulatory Visit (HOSPITAL_COMMUNITY): Payer: Self-pay

## 2024-02-13 VITALS — BP 134/84 | HR 84 | Temp 98.6°F | Ht 64.0 in | Wt 172.8 lb

## 2024-02-13 DIAGNOSIS — Z7984 Long term (current) use of oral hypoglycemic drugs: Secondary | ICD-10-CM | POA: Diagnosis not present

## 2024-02-13 DIAGNOSIS — N951 Menopausal and female climacteric states: Secondary | ICD-10-CM

## 2024-02-13 DIAGNOSIS — E119 Type 2 diabetes mellitus without complications: Secondary | ICD-10-CM

## 2024-02-13 DIAGNOSIS — Z833 Family history of diabetes mellitus: Secondary | ICD-10-CM

## 2024-02-13 DIAGNOSIS — Z7985 Long-term (current) use of injectable non-insulin antidiabetic drugs: Secondary | ICD-10-CM | POA: Diagnosis not present

## 2024-02-13 DIAGNOSIS — Z0000 Encounter for general adult medical examination without abnormal findings: Secondary | ICD-10-CM

## 2024-02-13 DIAGNOSIS — Z79899 Other long term (current) drug therapy: Secondary | ICD-10-CM | POA: Diagnosis not present

## 2024-02-13 DIAGNOSIS — E7849 Other hyperlipidemia: Secondary | ICD-10-CM

## 2024-02-13 LAB — POCT GLYCOSYLATED HEMOGLOBIN (HGB A1C): HbA1c, POC (controlled diabetic range): 5.2 % (ref 0.0–7.0)

## 2024-02-13 LAB — GLUCOSE, CAPILLARY: Glucose-Capillary: 77 mg/dL (ref 70–99)

## 2024-02-13 MED ORDER — VENLAFAXINE HCL ER 37.5 MG PO CP24
37.5000 mg | ORAL_CAPSULE | Freq: Every day | ORAL | 2 refills | Status: AC
  Filled 2024-02-13: qty 30, 30d supply, fill #0
  Filled 2024-03-10: qty 30, 30d supply, fill #1
  Filled 2024-04-15: qty 30, 30d supply, fill #2

## 2024-02-13 MED ORDER — ATORVASTATIN CALCIUM 40 MG PO TABS
40.0000 mg | ORAL_TABLET | Freq: Every day | ORAL | 3 refills | Status: AC
  Filled 2024-02-13: qty 90, 90d supply, fill #0
  Filled 2024-05-27: qty 90, 90d supply, fill #1

## 2024-02-13 NOTE — Assessment & Plan Note (Signed)
 Reports vasomotor symptoms including hot flashes, sweats and irritability which has worsened over the past 6 months.  Reports ablation when she was [age] years old.  Was taking Paxil  for mood along with vasomotor symptoms but stopped after working with [facility].  States Paxil  provided some relief for her vasomotor symptoms. Now on Wellbutrin . Discussed starting venlafaxine  for symptom control, patient agreeable.  Plan -Start venlafaxine  37.5 mg daily, can titrate up as needed

## 2024-02-13 NOTE — Assessment & Plan Note (Signed)
 Status: controlled. Last A1c of 6.1 in 07/2023.  A1c today is 5.2.  Currently taking metformin  500 mg at bedtime, Mounjaro  10 mg weekly.   Plan -Continue metformin , Mounjaro  -A1c every 6 months  -Ophthalmology exam: plan to schedule [facility]  -Urine ACR 04/2023 -LDL 101 on 07/2023, taking atorvastatin  40 mg

## 2024-02-13 NOTE — Patient Instructions (Addendum)
 Thank you, [name] for allowing us  to provide your care today. Today we discussed:  -Start venlafaxine  37.5 mg once a day  -  I have ordered the following medication/changed the following medications:  Start the following medications: Meds ordered this encounter  Medications   atorvastatin  (LIPITOR) 40 MG tablet    Sig: Take 1 tablet (40 mg total) by mouth at bedtime.    Dispense:  90 tablet    Refill:  3   venlafaxine  XR (EFFEXOR  XR) 37.5 MG 24 hr capsule    Sig: Take 1 capsule (37.5 mg total) by mouth daily.    Dispense:  30 capsule    Refill:  2     Follow up: 4-6 months    Should you have any questions or concerns please call the internal medicine clinic at [phone].    Grason Brailsford, D.O. [facility]

## 2024-02-13 NOTE — Assessment & Plan Note (Signed)
Taking atorvastatin 40 mg

## 2024-02-13 NOTE — Progress Notes (Signed)
 CC: Annual physical  HPI: [name] is a 63 y.o. female living with a history stated below and presents today for annual physical. Please see problem based assessment and plan for additional details.  Past Medical History:  Diagnosis Date   Allergy    SEASONAL   Anemia, iron deficiency 09/24/2014   Back pain    Depression    History of stomach ulcers    Hyperlipidemia    Joint pain    Multiple food allergies    Prediabetes    Vitamin D  deficiency     Current Outpatient Medications on File Prior to Visit  Medication Sig Dispense Refill   atorvastatin  (LIPITOR) 40 MG tablet Take 1 tablet (40 mg total) by mouth at bedtime. 90 tablet 3   buPROPion  (WELLBUTRIN  SR) 150 MG 12 hr tablet Take 1 tablet (150 mg total) by mouth 2 (two) times daily. 60 tablet 1   metFORMIN  (GLUCOPHAGE ) 500 MG tablet Take 1 tablet (500 mg total) by mouth daily. 30 tablet 0   tirzepatide  (MOUNJARO ) 10 MG/0.5ML Pen Inject 10 mg into the skin once a week. 2 mL 1   No current facility-administered medications on file prior to visit.    Family History  Problem Relation Age of Onset   Cancer Mother 36       Squamous cell Primary unknown   Hypertension Mother    Hyperlipidemia Mother    Thyroid  disease Mother    Cancer Father        Prostate cancer   Diabetes Father    Thyroid  disease Maternal Aunt    Cancer Maternal Aunt        All mother siblings died from cancer   Thyroid  disease Maternal Uncle    Cancer Maternal Uncle    Thyroid  disease Maternal Grandmother    Cancer Maternal Grandmother        STOMACH CANCER   Cancer Maternal Grandfather        Prostate cancer    Social History   Socioeconomic History   Marital status: Married    Spouse name: Victoria Garcia   Number of children: 2   Years of education: Not on file   Highest education level: Not on file  Occupational History   Occupation: Cardiovascular Specialist  Tobacco Use   Smoking status: Never   Smokeless tobacco:  Never  Vaping Use   Vaping status: Never Used  Substance and Sexual Activity   Alcohol use: Yes    Comment: few drinks per month   Drug use: No   Sexual activity: Yes    Birth control/protection: Surgical  Other Topics Concern   Not on file  Social History Narrative   Married, has 2 children, boy and girl.  Works at cardiac cath lab, cardiovascular specialist, degree in radiology.  Exercise - walks at least a mile daily.    12/2021.   Social Drivers of Corporate investment banker Strain: Not on file  Food Insecurity: Not on file  Transportation Needs: Not on file  Physical Activity: Not on file  Stress: Not on file  Social Connections: Not on file  Intimate Partner Violence: Not on file    Review of Systems: ROS negative except for what is noted on the assessment and plan.  Vitals:   02/13/24 1556  BP: 134/84  Pulse: 84  Temp: 98.6 F (37 C)  TempSrc: Oral  Weight: 172 lb 12.8 oz (78.4 kg)  Height: 5' 4 (1.626 m)  PF: 98  L/min   Physical Exam: Constitutional: well-appearing female sitting in chair, in no acute distress Cardiovascular: regular rate and rhythm Pulmonary/Chest: normal work of breathing on room air, lungs clear to auscultation bilaterally Neurological: alert & oriented x 3 Skin: warm and dry Psych: Normal mood and affect  Assessment & Plan:   Assessment & Plan Type 2 diabetes mellitus without complication, without long-term current use of insulin  (HCC) Status: controlled. Last A1c of 6.1 in 07/2023.  A1c today is 5.2.  Currently taking metformin  500 mg at bedtime, Mounjaro  10 mg weekly.   Plan -Continue metformin , Mounjaro  -A1c every 6 months  -Ophthalmology exam: plan to schedule Victoria Garcia  -Urine ACR 04/2023 -LDL 101 on 07/2023, taking atorvastatin  40 mg Other hyperlipidemia Taking atorvastatin  40 mg. Vasomotor symptoms due to menopause Reports vasomotor symptoms including hot flashes, sweats and irritability which has worsened over the  past 6 months.  Reports ablation when she was [age] years old.  Was taking Paxil  for mood along with vasomotor symptoms but stopped after working with [facility].  States Paxil  provided some relief for her vasomotor symptoms. Now on Wellbutrin . Discussed starting venlafaxine  for symptom control, patient agreeable.  Plan -Start venlafaxine  37.5 mg daily, can titrate up as needed Healthcare maintenance States completed flu shot recently.  Deferred Tdap and PCV to later date.   Return in about 6 months (around 08/12/2024) for routine visit .  Patient discussed with Dr. Machen  Stanley Helmuth, D.O. [facility] Internal Medicine, PGY-3 Phone: [phone] Date 02/13/2024 Time 4:04 PM

## 2024-02-13 NOTE — Assessment & Plan Note (Signed)
 States completed flu shot recently.  Deferred Tdap and PCV to later date.

## 2024-02-14 NOTE — Progress Notes (Signed)
 Internal Medicine Clinic Attending  Case discussed with the resident at the time of the visit.  We reviewed the resident's history and exam and pertinent patient test results.  I agree with the assessment, diagnosis, and plan of care documented in the resident's note.

## 2024-02-15 ENCOUNTER — Encounter: Payer: Self-pay | Admitting: Student

## 2024-02-26 NOTE — Progress Notes (Deleted)
   SUBJECTIVE: Discussed the use of AI scribe software for clinical note transcription with the patient, who gave verbal consent to proceed.  Chief Complaint: Obesity  Interim History: ***  Victoria Garcia is here to discuss her progress with her obesity treatment plan. She is on the {HWW Weight Loss Plan:210964005} and states she {CHL AMB IS/IS NOT:210130109} following her eating plan approximately *** % of the time. She states she {CHL AMB IS/IS NOT:210130109} exercising *** minutes *** times per week.   OBJECTIVE: Visit Diagnoses: Problem List Items Addressed This Visit     Vitamin D  deficiency   Mood disorder (HCC), emotional eating   Other Visit Diagnoses       Type 2 diabetes without long term current  insulin  use    -  Primary     Hyperlipidemia associated with type 2 diabetes mellitus (HCC)         Obesity, starting BMI 34.33           No data recorded No data recorded No data recorded No data recorded   ASSESSMENT AND PLAN:  Diet: Victoria Garcia {CHL AMB IS/IS NOT:210130109} currently in the action stage of change. As such, her goal is to {HWW Weight Loss Efforts:210964006}. She {HAS HAS WNU:81165} agreed to {HWW Weight Loss Plan:210964005}.  Exercise: Victoria Garcia has been instructed {HWW Exercise:210964007} for weight loss and overall health benefits.   Behavior Modification:  We discussed the following Behavioral Modification Strategies today: {HWW Behavior Modification:210964008}. We discussed various medication options to help Victoria Garcia with her weight loss efforts and we both agreed to ***.  No follow-ups on file.SABRA She was informed of the importance of frequent follow up visits to maximize her success with intensive lifestyle modifications for her multiple health conditions.  Attestation Statements:   Reviewed by clinician on day of visit: allergies, medications, problem list, medical history, surgical history, family history, social history, and previous encounter notes.    Time spent on visit including pre-visit chart review and post-visit care and charting was *** minutes.    Jennet Scroggin, PA-C

## 2024-02-27 ENCOUNTER — Ambulatory Visit (INDEPENDENT_AMBULATORY_CARE_PROVIDER_SITE_OTHER): Admitting: Physician Assistant

## 2024-02-27 ENCOUNTER — Other Ambulatory Visit (INDEPENDENT_AMBULATORY_CARE_PROVIDER_SITE_OTHER): Payer: Self-pay | Admitting: Physician Assistant

## 2024-02-27 ENCOUNTER — Encounter (INDEPENDENT_AMBULATORY_CARE_PROVIDER_SITE_OTHER): Payer: Self-pay

## 2024-02-27 DIAGNOSIS — E66811 Obesity, class 1: Secondary | ICD-10-CM

## 2024-02-27 DIAGNOSIS — E559 Vitamin D deficiency, unspecified: Secondary | ICD-10-CM

## 2024-02-27 DIAGNOSIS — F39 Unspecified mood [affective] disorder: Secondary | ICD-10-CM

## 2024-02-27 DIAGNOSIS — E1169 Type 2 diabetes mellitus with other specified complication: Secondary | ICD-10-CM

## 2024-02-27 DIAGNOSIS — E1165 Type 2 diabetes mellitus with hyperglycemia: Secondary | ICD-10-CM

## 2024-02-28 ENCOUNTER — Other Ambulatory Visit (HOSPITAL_COMMUNITY): Payer: Self-pay

## 2024-02-28 ENCOUNTER — Ambulatory Visit (INDEPENDENT_AMBULATORY_CARE_PROVIDER_SITE_OTHER): Admitting: Family Medicine

## 2024-02-28 ENCOUNTER — Encounter (INDEPENDENT_AMBULATORY_CARE_PROVIDER_SITE_OTHER): Payer: Self-pay | Admitting: Family Medicine

## 2024-02-28 VITALS — BP 106/72 | HR 85 | Temp 98.3°F | Ht 64.0 in | Wt 166.0 lb

## 2024-02-28 DIAGNOSIS — Z7985 Long-term (current) use of injectable non-insulin antidiabetic drugs: Secondary | ICD-10-CM

## 2024-02-28 DIAGNOSIS — E669 Obesity, unspecified: Secondary | ICD-10-CM | POA: Diagnosis not present

## 2024-02-28 DIAGNOSIS — E559 Vitamin D deficiency, unspecified: Secondary | ICD-10-CM

## 2024-02-28 DIAGNOSIS — Z7984 Long term (current) use of oral hypoglycemic drugs: Secondary | ICD-10-CM

## 2024-02-28 DIAGNOSIS — F5089 Other specified eating disorder: Secondary | ICD-10-CM | POA: Diagnosis not present

## 2024-02-28 DIAGNOSIS — Z6828 Body mass index (BMI) 28.0-28.9, adult: Secondary | ICD-10-CM

## 2024-02-28 DIAGNOSIS — N951 Menopausal and female climacteric states: Secondary | ICD-10-CM | POA: Diagnosis not present

## 2024-02-28 DIAGNOSIS — F39 Unspecified mood [affective] disorder: Secondary | ICD-10-CM

## 2024-02-28 DIAGNOSIS — E1165 Type 2 diabetes mellitus with hyperglycemia: Secondary | ICD-10-CM | POA: Diagnosis not present

## 2024-02-28 DIAGNOSIS — Z6829 Body mass index (BMI) 29.0-29.9, adult: Secondary | ICD-10-CM

## 2024-02-28 MED ORDER — TIRZEPATIDE 10 MG/0.5ML ~~LOC~~ SOAJ
10.0000 mg | SUBCUTANEOUS | 0 refills | Status: DC
  Filled 2024-02-28: qty 2, 28d supply, fill #0

## 2024-02-28 MED ORDER — METFORMIN HCL 500 MG PO TABS
500.0000 mg | ORAL_TABLET | Freq: Every day | ORAL | 0 refills | Status: DC
  Filled 2024-02-28: qty 30, 30d supply, fill #0

## 2024-02-28 NOTE — Progress Notes (Signed)
 Victoria DOROTHA Garcia, D.O.  ABFM, ABOM Specializing in Clinical Bariatric Medicine  Office located at: [address] [place], [place]  [address]    FOR THE CHRONIC DISEASE OF OBESITY:   Generalized obesity- Start BMI 34.33; BMI 29.0-29.9, adult Current BMI 28.48  Weight Summary and Body Composition Analysis (BIA)   Weight Lost Since Last Visit: 2lb  Weight Gained Since Last Visit: 0lb    Vitals Temp: 98.3 F (36.8 C) BP: 106/72 Pulse Rate: 85 SpO2: 98 %   Anthropometric Measurements Height: 5' 4 (1.626 m) Weight: 166 lb (75.3 kg) BMI (Calculated): 28.48 Weight at Last Visit: 168lb Weight Lost Since Last Visit: 2lb Weight Gained Since Last Visit: 0lb Starting Weight: 200lb Total Weight Loss (lbs): 34 lb (15.4 kg) Peak Weight: 220lb   Body Composition  Body Fat %: 37.2 % Fat Mass (lbs): 62 lbs Muscle Mass (lbs): 99.2 lbs Total Body Water (lbs): 73.4 lbs Visceral Fat Rating : 9   Other Clinical Data Fasting: no Labs: no Today's Visit #: 46 Starting Date: 10/27/20    Chief complaint: Obesity  Interval History Victoria Garcia is here for a follow-up office visit to discuss her progress with her obesity treatment plan. She is on the Category 2 Plan and states she is following her eating plan approximately 80 % of the time. She is doing pilates 60  minutes 3 days per week  She has experienced a weight loss of 2 lbs since last OV on 02/01/2024.   Her dietary and life habits include:  - Tracking Calories/Macros: no, she is not on a journaling plan.  - Eating More Whole Foods: yes  - Adequate Protein Intake: yes  - Adequate Water Intake: no  - Skipping Meals: no  - Sleeping 7-9 Hours/ Night: no, she is currently sleeping 6 hours/nite.    02/01/24 11:00 02/28/24 16:00   Body Fat % 35.6 % 37.2 %  Muscle Mass (lbs) 102.8 lbs 99.2 lbs  Fat Mass (lbs) 59.8 lbs 62 lbs  Total Body Water (lbs) 71.8 lbs 73.4 lbs  Visceral Fat Rating  9 9    Counseling done on how various foods will affect these numbers and how to maximize success  Total lbs lost to date: - 34 lbs Total Fat Mass lost to date: - 26.2 lbs Total weight loss percentage to date: -17.00 %   Nutritional and Behavioral Counseling:  We discussed the following today: continue to work on maintaining a reduced calorie state, getting the recommended amount of protein, incorporating whole foods, making healthy choices, staying well hydrated and practicing mindfulness when eating.  Additional resources provided today: Handout on Common Characteristics of Successful Weight Losers and Maintainers   Evidence-based interventions for health behavior change were utilized today including the discussion of self monitoring techniques, problem-solving barriers and SMART goal setting techniques.   Regarding patient's less desirable eating habits and patterns, we employed the technique of small changes.   SMART Goal(s) created today: n/a   Recommended Dietary Goals Victoria Garcia is currently in the action stage of change. As such, her goal is to continue weight management plan.  She has agreed to continue the CAT 2 MP.    Recommended Physical Activity Goals Victoria Garcia has been advised to work up to 300-450 minutes of moderate intensity aerobic activity a week and strengthening exercises 2-3 times per week for cardiovascular health, weight loss maintenance and preservation of muscle mass.   She was encouraged to continue to gradually increase the amount  and intensity of exercise routine.    Medical Interventions and Pharmacotherapy Previous Bariatric surgery: n/a Pharmacotherapy that aid with weight loss: continue metformin  and mounjaro  at same doses.    OBESITY RELATED CONDITIONS ADDRESSED TODAY:    Medications Discontinued During This Encounter  Medication Reason   metFORMIN  (GLUCOPHAGE ) 500 MG tablet Reorder   tirzepatide  (MOUNJARO ) 10 MG/0.5ML Pen Reorder     Meds ordered  this encounter  Medications   tirzepatide  (MOUNJARO ) 10 MG/0.5ML Pen    Sig: Inject 10 mg into the skin once a week.    Dispense:  2 mL    Refill:  0   metFORMIN  (GLUCOPHAGE ) 500 MG tablet    Sig: Take 1 tablet (500 mg total) by mouth daily.    Dispense:  30 tablet    Refill:  0     Type 2 diabetes without long term current  insulin  use Assessment & Plan: Lab Results  Component Value Date   HGBA1C 5.2 02/13/2024   HGBA1C 6.1 (H) 07/27/2023   HGBA1C 5.8 (A) 05/08/2023   INSULIN  13.6 07/27/2023   INSULIN  23.6 01/09/2023   INSULIN  13.8 06/13/2022    HgbA1c is under excellent control for age and comorbid conditions. Denies symptoms of hypoglycemia or hyperglycemia. On Metformin  500 mg daily and Mounjaro  10 mg wkly with good adherence and no side effects. She has been on Mounjaro  since 01/17/2023 and has been on the 10 mg dose since 07/27/2023. Good control of hunger and cravings. Of note, her BP is at goal today. Advised pt to discuss with PCP about potentially starting a low dose ARB for renal protection. Maintain with antidiabetic medications at current doses. Cont balanced diet focusing on protein, fruits, and vegetables while limiting simple carbohydrates. Increase exercise as able.    Mood disorder (HCC), emotional eating Hot flashes due to menopause Assessment & Plan: She was seen by her PCP on 9/23 for vasomotor symptoms due to menopause. Her symptoms included hot flashes, sweats, and irritability which had worsened over the past 6 months. She was started on Effexor  XR 37.5 mg daily and feels that her symptoms are much better controlled. Her sleep has improved from 5 to 6 hours/nite. She also mentions stopping her Wellbutrin  on Oct 2nd because I didn't feel well on it. She has not experienced any increased cravings, emotional eating, or worsening of her mood. She will continue to hold off on the Wellbutrin . Aim for 7-9 hrs of sleep/nite. Will continue to monitor.     Vitamin D   deficiency Assessment & Plan: Lab Results  Component Value Date   VD25OH 58.2 07/27/2023   VD25OH 49.8 01/09/2023   VD25OH 55.7 06/13/2022   She is on OTC Vit D 2,000 units 3 days/week. Her most recent Vit D was at goal of 50-70. Continue supplementation; recheck as deemed medically necessary.    Objective:   PHYSICAL EXAM: Blood pressure 106/72, pulse 85, temperature 98.3 F (36.8 C), height 5' 4 (1.626 m), weight 166 lb (75.3 kg), SpO2 98%. Body mass index is 28.49 kg/m.  General: she is overweight, cooperative and in no acute distress. PSYCH: Has normal mood, affect and thought process.   HEENT: EOMI, sclerae are anicteric. Lungs: Normal breathing effort, no conversational dyspnea. Extremities: Moves * 4 Neurologic: A and O * 3, good insight  DIAGNOSTIC DATA REVIEWED: BMET    Component Value Date/Time   NA 140 07/27/2023 0754   K 4.5 07/27/2023 0754   CL 101 07/27/2023 0754   CO2 25  07/27/2023 0754   GLUCOSE 94 07/27/2023 0754   GLUCOSE 98 02/17/2021 0825   BUN 19 07/27/2023 0754   CREATININE 0.77 07/27/2023 0754   CREATININE 0.73 02/17/2021 0825   CALCIUM  9.5 07/27/2023 0754   GFRNONAA 96 08/17/2020 1000   GFRAA 111 08/17/2020 1000   Lab Results  Component Value Date   HGBA1C 5.2 02/13/2024   HGBA1C 6.5 (H) 05/07/2014   Lab Results  Component Value Date   INSULIN  13.6 07/27/2023   INSULIN  14.3 10/27/2020   Lab Results  Component Value Date   TSH 1.510 01/09/2023   CBC    Component Value Date/Time   WBC 8.2 07/27/2023 0754   WBC 7.0 08/17/2020 1000   RBC 4.57 07/27/2023 0754   RBC 4.48 08/17/2020 1000   HGB 14.0 07/27/2023 0754   HCT 43.1 07/27/2023 0754   PLT 257 07/27/2023 0754   MCV 94 07/27/2023 0754   MCH 30.6 07/27/2023 0754   MCH 30.1 08/17/2020 1000   MCHC 32.5 07/27/2023 0754   MCHC 33.3 08/17/2020 1000   RDW 12.4 07/27/2023 0754   Iron Studies    Component Value Date/Time   IRON 50 09/24/2014 1018   Lipid Panel      Component Value Date/Time   CHOL 159 07/27/2023 0754   TRIG 86 07/27/2023 0754   HDL 42 07/27/2023 0754   CHOLHDL 4.2 01/12/2022 1552   CHOLHDL 4.4 02/17/2021 0825   VLDL 21 01/13/2017 0924   LDLCALC 101 (H) 07/27/2023 0754   LDLCALC 105 (H) 02/17/2021 0825   LDLDIRECT 85 06/17/2021 1026   Hepatic Function Panel     Component Value Date/Time   PROT 7.4 07/27/2023 0754   ALBUMIN 4.2 07/27/2023 0754   AST 25 07/27/2023 0754   ALT 34 (H) 07/27/2023 0754   ALKPHOS 122 (H) 07/27/2023 0754   BILITOT 0.8 07/27/2023 0754      Component Value Date/Time   TSH 1.510 01/09/2023 0833   Nutritional Lab Results  Component Value Date   VD25OH 58.2 07/27/2023   VD25OH 49.8 01/09/2023   VD25OH 55.7 06/13/2022     Follow up:   Return 03/27/2024 at 3:40 PM with Verdon Parry D, MD.  She was informed of the importance of frequent follow up visits to maximize her success with intensive lifestyle modifications for her multiple health conditions.   Attestations:   I, Special Puri, acting as a Stage manager for [employer], DO., have compiled all relevant documentation for today's office visit on behalf of Victoria Jenkins, DO, while in the presence of [employer], DO.  Pertinent positives were addressed with patient today. Reviewed by clinician on day of visit: allergies, medications, problem list, medical history, surgical history, family history, social history, and previous encounter notes.  I have reviewed the above documentation for accuracy and completeness, and I agree with the above. Victoria Garcia, D.O.  The 21st Century Cures Act was signed into law in 2016 which includes the topic of electronic health records.  This provides immediate access to information in MyChart. This includes consultation notes, operative notes, office notes, lab results and pathology reports.  If you have any questions about what you read please let us  know at your next visit so we can discuss  your concerns and take corrective action if need be.  We are right here with you.

## 2024-03-27 ENCOUNTER — Encounter (INDEPENDENT_AMBULATORY_CARE_PROVIDER_SITE_OTHER): Payer: Self-pay | Admitting: Family Medicine

## 2024-03-27 ENCOUNTER — Ambulatory Visit (INDEPENDENT_AMBULATORY_CARE_PROVIDER_SITE_OTHER): Payer: Self-pay | Admitting: Family Medicine

## 2024-03-27 ENCOUNTER — Other Ambulatory Visit (HOSPITAL_COMMUNITY): Payer: Self-pay

## 2024-03-27 VITALS — BP 119/68 | HR 85 | Temp 98.1°F | Ht 64.0 in | Wt 163.0 lb

## 2024-03-27 DIAGNOSIS — Z7985 Long-term (current) use of injectable non-insulin antidiabetic drugs: Secondary | ICD-10-CM | POA: Diagnosis not present

## 2024-03-27 DIAGNOSIS — E669 Obesity, unspecified: Secondary | ICD-10-CM | POA: Diagnosis not present

## 2024-03-27 DIAGNOSIS — Z6827 Body mass index (BMI) 27.0-27.9, adult: Secondary | ICD-10-CM | POA: Diagnosis not present

## 2024-03-27 DIAGNOSIS — F5089 Other specified eating disorder: Secondary | ICD-10-CM

## 2024-03-27 DIAGNOSIS — Z7984 Long term (current) use of oral hypoglycemic drugs: Secondary | ICD-10-CM

## 2024-03-27 DIAGNOSIS — E1165 Type 2 diabetes mellitus with hyperglycemia: Secondary | ICD-10-CM

## 2024-03-27 DIAGNOSIS — F39 Unspecified mood [affective] disorder: Secondary | ICD-10-CM

## 2024-03-27 DIAGNOSIS — E119 Type 2 diabetes mellitus without complications: Secondary | ICD-10-CM | POA: Diagnosis not present

## 2024-03-27 MED ORDER — METFORMIN HCL 500 MG PO TABS
500.0000 mg | ORAL_TABLET | Freq: Every day | ORAL | 0 refills | Status: DC
  Filled 2024-03-27: qty 30, 30d supply, fill #0

## 2024-03-27 MED ORDER — TIRZEPATIDE 10 MG/0.5ML ~~LOC~~ SOAJ
10.0000 mg | SUBCUTANEOUS | 0 refills | Status: DC
  Filled 2024-03-27: qty 2, 28d supply, fill #0

## 2024-03-27 MED ORDER — BUPROPION HCL ER (SR) 150 MG PO TB12
150.0000 mg | ORAL_TABLET | Freq: Two times a day (BID) | ORAL | 1 refills | Status: DC
  Filled 2024-03-27: qty 60, 30d supply, fill #0

## 2024-03-27 NOTE — Progress Notes (Signed)
 Office: [phone]  /  Fax: [phone]  WEIGHT SUMMARY AND BIOMETRICS  Anthropometric Measurements Height: 5' 4 (1.626 m) Weight: 163 lb (73.9 kg) BMI (Calculated): 27.97 Weight at Last Visit: 166 lb Weight Lost Since Last Visit: 3 lb Weight Gained Since Last Visit: 0 Starting Weight: 200 lb Total Weight Loss (lbs): 37 lb (16.8 kg) Peak Weight: 220 lb   Body Composition  Body Fat %: 37.4 % Fat Mass (lbs): 61.2 lbs Muscle Mass (lbs): 97 lbs Total Body Water (lbs): 72.2 lbs Visceral Fat Rating : 9   Other Clinical Data Fasting: no Labs: no Today's Visit #: 70 Starting Date: 10/27/20    Chief Complaint: OBESITY   History of Present Illness Victoria Garcia is a 63 year old female with obesity and type two diabetes who presents for obesity treatment and progress assessment.  She adheres to the category two eating plan approximately 80% of the time, focusing on increasing protein intake and consuming more fruits and vegetables. She is working on not skipping meals but reports inadequate hydration and insufficient sleep, typically not achieving seven to nine hours per night. She exercises by doing Pilates for 60 minutes, three days a week. She has lost three pounds in the last month since her last visit.  She is managing type two diabetes with metformin  500 mg daily and Mounjaro  10 mg weekly, and requests refills for these medications. No gastrointestinal issues are reported with these medications.  She is also on Wellbutrin  to help with emotional eating behaviors, which she feels has been helpful in reducing cravings without side effects.      PHYSICAL EXAM:  Blood pressure 119/68, pulse 85, temperature 98.1 F (36.7 C), height 5' 4 (1.626 m), weight 163 lb (73.9 kg), SpO2 99%. Body mass index is 27.98 kg/m.  DIAGNOSTIC DATA REVIEWED:  BMET    Component Value Date/Time   NA 140 07/27/2023 0754   K 4.5 07/27/2023 0754   CL 101 07/27/2023 0754   CO2 25  07/27/2023 0754   GLUCOSE 94 07/27/2023 0754   GLUCOSE 98 02/17/2021 0825   BUN 19 07/27/2023 0754   CREATININE 0.77 07/27/2023 0754   CREATININE 0.73 02/17/2021 0825   CALCIUM  9.5 07/27/2023 0754   GFRNONAA 96 08/17/2020 1000   GFRAA 111 08/17/2020 1000   Lab Results  Component Value Date   HGBA1C 5.2 02/13/2024   HGBA1C 6.5 (H) 05/07/2014   Lab Results  Component Value Date   INSULIN  13.6 07/27/2023   INSULIN  14.3 10/27/2020   Lab Results  Component Value Date   TSH 1.510 01/09/2023   CBC    Component Value Date/Time   WBC 8.2 07/27/2023 0754   WBC 7.0 08/17/2020 1000   RBC 4.57 07/27/2023 0754   RBC 4.48 08/17/2020 1000   HGB 14.0 07/27/2023 0754   HCT 43.1 07/27/2023 0754   PLT 257 07/27/2023 0754   MCV 94 07/27/2023 0754   MCH 30.6 07/27/2023 0754   MCH 30.1 08/17/2020 1000   MCHC 32.5 07/27/2023 0754   MCHC 33.3 08/17/2020 1000   RDW 12.4 07/27/2023 0754   Iron Studies    Component Value Date/Time   IRON 50 09/24/2014 1018   Lipid Panel     Component Value Date/Time   CHOL 159 07/27/2023 0754   TRIG 86 07/27/2023 0754   HDL 42 07/27/2023 0754   CHOLHDL 4.2 01/12/2022 1552   CHOLHDL 4.4 02/17/2021 0825   VLDL 21 01/13/2017 0924   LDLCALC 101 (H)  07/27/2023 0754   LDLCALC 105 (H) 02/17/2021 0825   LDLDIRECT 85 06/17/2021 1026   Hepatic Function Panel     Component Value Date/Time   PROT 7.4 07/27/2023 0754   ALBUMIN 4.2 07/27/2023 0754   AST 25 07/27/2023 0754   ALT 34 (H) 07/27/2023 0754   ALKPHOS 122 (H) 07/27/2023 0754   BILITOT 0.8 07/27/2023 0754      Component Value Date/Time   TSH 1.510 01/09/2023 0833   Nutritional Lab Results  Component Value Date   VD25OH 58.2 07/27/2023   VD25OH 49.8 01/09/2023   VD25OH 55.7 06/13/2022     Assessment and Plan Assessment & Plan Obesity Management is ongoing with a focus on dietary modifications and exercise. She is following the category two eating plan 80% of the time, working on  increasing protein intake, and exercising with Pilates for 60 minutes, three days a week. She has lost 3 pounds in the last month. Challenges include adequate hydration and sleep. Strategies for increasing protein intake, such as a protein pudding recipe and journaling protein and calorie intake, were discussed. Emphasis on a 10:1 protein to calorie ratio for optimal results. - Continue category two eating plan - Encouraged increased protein intake with strategies like protein pudding recipe - Encouraged journaling protein and calorie intake - Continue Pilates exercise regimen - Encouraged adequate hydration and sleep  Type 2 diabetes mellitus Managed with metformin  and Mounjaro . She is on metformin  500 mg daily and Mounjaro  10 mg weekly. No gastrointestinal issues reported with these medications. Structured eating plans are being followed to aid in blood sugar control. - Refilled metformin  500 mg daily - Refilled Mounjaro  10 mg weekly - Continue structured eating plans for blood sugar control  Emotional eating behaviors Managed with Wellbutrin , which she reports has been helpful in reducing cravings without side effects. Strategies for managing eating behaviors during events like Thanksgiving were discussed, emphasizing portion control and mindful eating. - Continue Wellbutrin  for emotional eating - Refilled Wellbutrin  - Implement portion control and mindful eating strategies     Victoria Garcia was counseled on the importance of maintaining healthy lifestyle habits, including balanced nutrition, regular physical activity, and behavioral modifications, while taking antiobesity medication.  Patient verbalized understanding that medication is an adjunct to, not a replacement for, lifestyle changes and that the long-term success and weight maintenance depend on continued adherence to these strategies.   Victoria Garcia was informed of the importance of frequent follow up visits to maximize her success with  intensive lifestyle modifications for her obesity and obesity related health conditions as recommended by USPSTF and CMS guidelines   Louann Penton, MD

## 2024-04-24 ENCOUNTER — Encounter (INDEPENDENT_AMBULATORY_CARE_PROVIDER_SITE_OTHER): Payer: Self-pay | Admitting: Family Medicine

## 2024-04-24 ENCOUNTER — Ambulatory Visit (INDEPENDENT_AMBULATORY_CARE_PROVIDER_SITE_OTHER): Payer: Self-pay | Admitting: Family Medicine

## 2024-04-24 ENCOUNTER — Other Ambulatory Visit (HOSPITAL_COMMUNITY): Payer: Self-pay

## 2024-04-24 VITALS — BP 127/71 | HR 89 | Temp 98.8°F | Ht 64.0 in | Wt 162.0 lb

## 2024-04-24 DIAGNOSIS — Z7985 Long-term (current) use of injectable non-insulin antidiabetic drugs: Secondary | ICD-10-CM | POA: Diagnosis not present

## 2024-04-24 DIAGNOSIS — Z6827 Body mass index (BMI) 27.0-27.9, adult: Secondary | ICD-10-CM | POA: Diagnosis not present

## 2024-04-24 DIAGNOSIS — E669 Obesity, unspecified: Secondary | ICD-10-CM

## 2024-04-24 DIAGNOSIS — Z7984 Long term (current) use of oral hypoglycemic drugs: Secondary | ICD-10-CM | POA: Diagnosis not present

## 2024-04-24 DIAGNOSIS — F5089 Other specified eating disorder: Secondary | ICD-10-CM | POA: Diagnosis not present

## 2024-04-24 DIAGNOSIS — F39 Unspecified mood [affective] disorder: Secondary | ICD-10-CM

## 2024-04-24 DIAGNOSIS — E1165 Type 2 diabetes mellitus with hyperglycemia: Secondary | ICD-10-CM | POA: Diagnosis not present

## 2024-04-24 MED ORDER — BUPROPION HCL ER (SR) 150 MG PO TB12
150.0000 mg | ORAL_TABLET | Freq: Every day | ORAL | 0 refills | Status: AC
  Filled 2024-04-24: qty 30, 30d supply, fill #0

## 2024-04-24 MED ORDER — METFORMIN HCL 500 MG PO TABS
500.0000 mg | ORAL_TABLET | Freq: Every day | ORAL | 0 refills | Status: AC
  Filled 2024-04-24: qty 30, 30d supply, fill #0

## 2024-04-24 MED ORDER — TIRZEPATIDE 10 MG/0.5ML ~~LOC~~ SOAJ
10.0000 mg | SUBCUTANEOUS | 0 refills | Status: DC
  Filled 2024-04-24: qty 2, 28d supply, fill #0

## 2024-04-24 NOTE — Progress Notes (Signed)
 Victoria Garcia, D.O.  ABFM, ABOM Specializing in Clinical Bariatric Medicine  Office located at: [address] [place], [place]  [address]    FOR THE CHRONIC DISEASE OF OBESITY:   Obesity, starting BMI 34.33 BMI 27.0-27.9,adult - current BMI 27.79  Weight Summary and Body Composition Analysis  Weight Lost Since Last Visit: 1 lb  Weight Gained Since Last Visit: 0    Vitals Temp: 98.8 F (37.1 C) BP: 127/71 Pulse Rate: 89 SpO2: 100 %   Anthropometric Measurements Height: 5' 4 (1.626 m) Weight: 162 lb (73.5 kg) BMI (Calculated): 27.79 Weight at Last Visit: 163 lb Weight Lost Since Last Visit: 1 lb Weight Gained Since Last Visit: 0 Starting Weight: 200 lb Total Weight Loss (lbs): 38 lb (17.2 kg) Peak Weight: 220 lb   Body Composition  Body Fat %: 36.6 % Fat Mass (lbs): 59.6 lbs Muscle Mass (lbs): 97.8 lbs Total Body Water (lbs): 72.2 lbs Visceral Fat Rating : 9   Other Clinical Data Fasting: No Labs: No Today's Visit #: 48 Starting Date: 10/27/20    Chief complaint: Obesity  Interval History Victoria Garcia is here for a follow-up office visit to discuss her progress with her obesity treatment plan. She is on the Category 3 Plan and states she is following her eating plan approximately 80 % of the time. She is doing pilates 60  minutes 3 days per week  She has experienced a weight loss of 1 lb since last OV on 03/27/2024.   Her dietary and life habits include:  - Tracking Calories/Macros: not on a tracking plan  - Eating More Whole Foods: yes  - Adequate Protein Intake: yes  - Adequate Water Intake: yes  - Skipping Meals: no  - Sleeping 7-9 Hours/ Night: no, sleeping 6 hours/night    03/27/24 15:00 04/24/24 15:00   Body Fat % 37.4 % 36.6 %  Muscle Mass (lbs) 97 lbs 97.8 lbs  Fat Mass (lbs) 61.2 lbs 59.6 lbs  Total Body Water (lbs) 72.2 lbs 72.2 lbs  Visceral Fat Rating  9 9   Counseling done on how various foods will affect  these numbers and how to maximize success  Total Fat mass lost to date: - 28.6 lbs Total lbs lost to date: - 38 lbs Total weight loss percentage to date: - 19.00 %   Nutritional and Behavioral Counseling:  We discussed the following today: continue to work on maintaining a reduced calorie state, getting the recommended amount of protein, incorporating whole foods, making healthy choices, staying well hydrated and practicing mindfulness when eating.  Additional resources provided today: Handout on December Goals  Evidence-based interventions for health behavior change were utilized today including the discussion of self monitoring techniques, problem-solving barriers and SMART goal setting techniques.   Regarding patient's less desirable eating habits and patterns, we employed the technique of small changes.   SMART Goal(s) created today: n/a   Recommended Dietary Goals Yakira is currently in the action stage of change. As such, her goal is to continue weight management plan.  She has agreed to continue the CAT 3 MP   Recommended Physical Activity Goals Alessandra has been advised to work up to 300-450 minutes of moderate intensity aerobic activity a week and strengthening exercises 2-3 times per week for cardiovascular health, weight loss maintenance and preservation of muscle mass.   She may Continue to gradually increase the amount and intensity of exercise routine   Medical Interventions and Pharmacotherapy Previous Bariatric  surgery: n/a Pharmacotherapy: See T2DM note.   OBESITY RELATED CONDITIONS ADDRESSED TODAY:    Medications Discontinued During This Encounter  Medication Reason   buPROPion  (WELLBUTRIN  SR) 150 MG 12 hr tablet Reorder   metFORMIN  (GLUCOPHAGE ) 500 MG tablet Reorder   tirzepatide  (MOUNJARO ) 10 MG/0.5ML Pen Reorder     Meds ordered this encounter  Medications   metFORMIN  (GLUCOPHAGE ) 500 MG tablet    Sig: Take 1 tablet (500 mg total) by mouth daily.     Dispense:  30 tablet    Refill:  0   buPROPion  (WELLBUTRIN  SR) 150 MG 12 hr tablet    Sig: Take 1 tablet (150 mg total) by mouth daily.    Dispense:  30 tablet    Refill:  0    Ov for rf   tirzepatide  (MOUNJARO ) 10 MG/0.5ML Pen    Sig: Inject 10 mg into the skin once a week.    Dispense:  2 mL    Refill:  0     Type 2 diabetes without long term current  insulin  use Assessment & Plan: Lab Results  Component Value Date   HGBA1C 5.2 02/13/2024   HGBA1C 6.1 (H) 07/27/2023   HGBA1C 5.8 (A) 05/08/2023   INSULIN  13.6 07/27/2023   INSULIN  23.6 01/09/2023   INSULIN  13.8 06/13/2022    HgbA1c is at goal for age and comorbid conditions. Denies symptoms of hypoglycemia or hyperglycemia. On Metformin  500 mg daily and  Mounjaro  10 mg weekly with good adherence. Constipation controlled on 2 daily fiber gummies, she has not needed to take Miralax. Good control of hunger, cravings, and food noise. Cont antidiabetic regimen (refill meds today) and balanced diet focusing on protein, fruits, and vegetables while limiting simple carbohydrates.    Mood disorder (HCC), emotional eating Assessment & Plan: No SI/ HI.  Mood stable currently. She's doing fine on Wellbutrin  SR 150 mg daily. Of note, she is also on Effexor  XR 37.5 mg daily. Continue regimen and strategies to curb emotional eating. Cont prudent nutritional plan.     Objective:   PHYSICAL EXAM: Blood pressure 127/71, pulse 89, temperature 98.8 F (37.1 C), height 5' 4 (1.626 m), weight 162 lb (73.5 kg), SpO2 100%. Body mass index is 27.81 kg/m.  General: she is overweight, cooperative and in no acute distress. PSYCH: Has normal mood, affect and thought process.   HEENT: EOMI, sclerae are anicteric. Lungs: Normal breathing effort, no conversational dyspnea. Extremities: Moves * 4 Neurologic: A and O * 3, good insight  DIAGNOSTIC DATA REVIEWED: BMET    Component Value Date/Time   NA 140 07/27/2023 0754   K 4.5 07/27/2023 0754    CL 101 07/27/2023 0754   CO2 25 07/27/2023 0754   GLUCOSE 94 07/27/2023 0754   GLUCOSE 98 02/17/2021 0825   BUN 19 07/27/2023 0754   CREATININE 0.77 07/27/2023 0754   CREATININE 0.73 02/17/2021 0825   CALCIUM  9.5 07/27/2023 0754   GFRNONAA 96 08/17/2020 1000   GFRAA 111 08/17/2020 1000   Lab Results  Component Value Date   HGBA1C 5.2 02/13/2024   HGBA1C 6.5 (H) 05/07/2014   Lab Results  Component Value Date   INSULIN  13.6 07/27/2023   INSULIN  14.3 10/27/2020   Lab Results  Component Value Date   TSH 1.510 01/09/2023   CBC    Component Value Date/Time   WBC 8.2 07/27/2023 0754   WBC 7.0 08/17/2020 1000   RBC 4.57 07/27/2023 0754   RBC 4.48 08/17/2020 1000   HGB  14.0 07/27/2023 0754   HCT 43.1 07/27/2023 0754   PLT 257 07/27/2023 0754   MCV 94 07/27/2023 0754   MCH 30.6 07/27/2023 0754   MCH 30.1 08/17/2020 1000   MCHC 32.5 07/27/2023 0754   MCHC 33.3 08/17/2020 1000   RDW 12.4 07/27/2023 0754   Iron Studies    Component Value Date/Time   IRON 50 09/24/2014 1018   Lipid Panel     Component Value Date/Time   CHOL 159 07/27/2023 0754   TRIG 86 07/27/2023 0754   HDL 42 07/27/2023 0754   CHOLHDL 4.2 01/12/2022 1552   CHOLHDL 4.4 02/17/2021 0825   VLDL 21 01/13/2017 0924   LDLCALC 101 (H) 07/27/2023 0754   LDLCALC 105 (H) 02/17/2021 0825   LDLDIRECT 85 06/17/2021 1026   Hepatic Function Panel     Component Value Date/Time   PROT 7.4 07/27/2023 0754   ALBUMIN 4.2 07/27/2023 0754   AST 25 07/27/2023 0754   ALT 34 (H) 07/27/2023 0754   ALKPHOS 122 (H) 07/27/2023 0754   BILITOT 0.8 07/27/2023 0754      Component Value Date/Time   TSH 1.510 01/09/2023 0833   Nutritional Lab Results  Component Value Date   VD25OH 58.2 07/27/2023   VD25OH 49.8 01/09/2023   VD25OH 55.7 06/13/2022     Follow up:   Return 05/30/2024 at 2:40 PM with Verdon Parry D, MD.  She was informed of the importance of frequent follow up visits to maximize her success with  intensive lifestyle modifications for her multiple health conditions.   Attestations:   I, Special Puri, acting as a stage manager for [employer], DO., have compiled all relevant documentation for today's office visit on behalf of Victoria Jenkins, DO, while in the presence of [employer], DO.  Pertinent positives were addressed with patient today. Reviewed by clinician on day of visit: allergies, medications, problem list, medical history, surgical history, family history, social history, and previous encounter notes.  I have reviewed the above documentation for accuracy and completeness, and I agree with the above. Victoria JINNY Garcia, D.O.  The 21st Century Cures Act was signed into law in 2016 which includes the topic of electronic health records.  This provides immediate access to information in MyChart. This includes consultation notes, operative notes, office notes, lab results and pathology reports.  If you have any questions about what you read please let us  know at your next visit so we can discuss your concerns and take corrective action if need be.  We are right here with you.

## 2024-04-26 ENCOUNTER — Other Ambulatory Visit (HOSPITAL_COMMUNITY): Payer: Self-pay

## 2024-05-12 ENCOUNTER — Other Ambulatory Visit: Payer: Self-pay | Admitting: Student

## 2024-05-12 DIAGNOSIS — N951 Menopausal and female climacteric states: Secondary | ICD-10-CM

## 2024-05-13 ENCOUNTER — Other Ambulatory Visit: Payer: Self-pay

## 2024-05-13 ENCOUNTER — Other Ambulatory Visit (HOSPITAL_COMMUNITY): Payer: Self-pay

## 2024-05-13 MED ORDER — VENLAFAXINE HCL ER 37.5 MG PO CP24
37.5000 mg | ORAL_CAPSULE | Freq: Every day | ORAL | 3 refills | Status: AC
  Filled 2024-05-13: qty 90, 90d supply, fill #0

## 2024-05-29 ENCOUNTER — Encounter (INDEPENDENT_AMBULATORY_CARE_PROVIDER_SITE_OTHER): Payer: Self-pay | Admitting: Family Medicine

## 2024-05-30 ENCOUNTER — Telehealth (INDEPENDENT_AMBULATORY_CARE_PROVIDER_SITE_OTHER): Admitting: Family Medicine

## 2024-05-30 ENCOUNTER — Encounter (INDEPENDENT_AMBULATORY_CARE_PROVIDER_SITE_OTHER): Payer: Self-pay | Admitting: Family Medicine

## 2024-05-30 ENCOUNTER — Other Ambulatory Visit (HOSPITAL_COMMUNITY): Payer: Self-pay

## 2024-05-30 ENCOUNTER — Encounter (INDEPENDENT_AMBULATORY_CARE_PROVIDER_SITE_OTHER): Payer: Self-pay | Admitting: *Deleted

## 2024-05-30 VITALS — Ht 64.0 in

## 2024-05-30 DIAGNOSIS — Z6827 Body mass index (BMI) 27.0-27.9, adult: Secondary | ICD-10-CM | POA: Diagnosis not present

## 2024-05-30 DIAGNOSIS — F5089 Other specified eating disorder: Secondary | ICD-10-CM

## 2024-05-30 DIAGNOSIS — E1165 Type 2 diabetes mellitus with hyperglycemia: Secondary | ICD-10-CM | POA: Diagnosis not present

## 2024-05-30 DIAGNOSIS — E669 Obesity, unspecified: Secondary | ICD-10-CM | POA: Diagnosis not present

## 2024-05-30 DIAGNOSIS — Z7985 Long-term (current) use of injectable non-insulin antidiabetic drugs: Secondary | ICD-10-CM | POA: Diagnosis not present

## 2024-05-30 DIAGNOSIS — F39 Unspecified mood [affective] disorder: Secondary | ICD-10-CM

## 2024-05-30 MED ORDER — TIRZEPATIDE 10 MG/0.5ML ~~LOC~~ SOAJ
10.0000 mg | SUBCUTANEOUS | 0 refills | Status: AC
  Filled 2024-05-30: qty 2, 28d supply, fill #0

## 2024-05-30 NOTE — Progress Notes (Signed)
 "  Office: [phone]  /  Fax: [phone]  WEIGHT SUMMARY AND BIOMETRICS  Anthropometric Measurements Height: 5' 4 (1.626 m) Weight: -- (Virtual Visit) Weight at Last Visit: 162 lb Starting Weight: 200 lb Peak Weight: 220 lb   No data recorded Other Clinical Data Fasting: N/A Labs: N/A Today's Visit #: 28 Starting Date: 10/27/20 Comments: [facility] VIDEO VISIT    Chief Complaint: OBESITY   Discussed the use of AI scribe software for clinical note transcription with the patient, who gave verbal consent to proceed.  History of Present Illness Victoria Garcia is a 64 year old female with obesity and type 2 diabetes who presents for obesity treatment and progress assessment.  She is adhering to a category two eating plan approximately 80% of the time, which includes structured options for breakfast and lunch. However, she occasionally skips meals and is not hydrating adequately. She is making efforts to meet her protein intake goals. She perceives weight gain over the holidays but is uncertain of the exact amount, with her current weight at 163 pounds, up from 162 pounds previously.  She engages in physical activity three days a week for 60 minutes, participating in Pilates and walking. Post-holidays, she is resuming meal planning and preparation.  Her type 2 diabetes management includes diet, exercise, weight loss, and Mounjaro  10 mg weekly. Her last hemoglobin A1c, measured ten months ago, was 6.1. She has requested a refill of her Mounjaro  medication.  She has been using Wellbutrin  more regularly during the holidays, taking it twice daily but occasionally forgetting the second dose. She aims to take it in the morning and around 3 or 4 PM.      PHYSICAL EXAM:  Height 5' 4 (1.626 m). Body mass index is 27.81 kg/m.  DIAGNOSTIC DATA REVIEWED:  BMET    Component Value Date/Time   NA 140 07/27/2023 0754   K 4.5 07/27/2023 0754   CL 101 07/27/2023 0754   CO2 25  07/27/2023 0754   GLUCOSE 94 07/27/2023 0754   GLUCOSE 98 02/17/2021 0825   BUN 19 07/27/2023 0754   CREATININE 0.77 07/27/2023 0754   CREATININE 0.73 02/17/2021 0825   CALCIUM  9.5 07/27/2023 0754   GFRNONAA 96 08/17/2020 1000   GFRAA 111 08/17/2020 1000   Lab Results  Component Value Date   HGBA1C 5.2 02/13/2024   HGBA1C 6.5 (H) 05/07/2014   Lab Results  Component Value Date   INSULIN  13.6 07/27/2023   INSULIN  14.3 10/27/2020   Lab Results  Component Value Date   TSH 1.510 01/09/2023   CBC    Component Value Date/Time   WBC 8.2 07/27/2023 0754   WBC 7.0 08/17/2020 1000   RBC 4.57 07/27/2023 0754   RBC 4.48 08/17/2020 1000   HGB 14.0 07/27/2023 0754   HCT 43.1 07/27/2023 0754   PLT 257 07/27/2023 0754   MCV 94 07/27/2023 0754   MCH 30.6 07/27/2023 0754   MCH 30.1 08/17/2020 1000   MCHC 32.5 07/27/2023 0754   MCHC 33.3 08/17/2020 1000   RDW 12.4 07/27/2023 0754   Iron Studies    Component Value Date/Time   IRON 50 09/24/2014 1018   Lipid Panel     Component Value Date/Time   CHOL 159 07/27/2023 0754   TRIG 86 07/27/2023 0754   HDL 42 07/27/2023 0754   CHOLHDL 4.2 01/12/2022 1552   CHOLHDL 4.4 02/17/2021 0825   VLDL 21 01/13/2017 0924   LDLCALC 101 (H) 07/27/2023 0754   LDLCALC 105 (H)  02/17/2021 0825   LDLDIRECT 85 06/17/2021 1026   Hepatic Function Panel     Component Value Date/Time   PROT 7.4 07/27/2023 0754   ALBUMIN 4.2 07/27/2023 0754   AST 25 07/27/2023 0754   ALT 34 (H) 07/27/2023 0754   ALKPHOS 122 (H) 07/27/2023 0754   BILITOT 0.8 07/27/2023 0754      Component Value Date/Time   TSH 1.510 01/09/2023 0833   Nutritional Lab Results  Component Value Date   VD25OH 58.2 07/27/2023   VD25OH 49.8 01/09/2023   VD25OH 55.7 06/13/2022     Assessment and Plan Assessment & Plan Obesity Management is ongoing with a focus on dietary adherence and exercise. She follows the category two eating plan 80% of the time, exercises three days a  week for 60 minutes, and engages in Pilates and walking. She occasionally skips meals and does not hydrate adequately but is meeting protein goals. She gained one pound over the holidays, which is considered minimal. She is working on meal planning and preparation to maintain dietary adherence. - Continue category two eating plan with focus on meal planning and preparation. - Provided list of make-ahead meals to assist with meal planning. - Sent Mounjaro  refill to pharmacy.  Type 2 diabetes mellitus with hyperglycemia Type 2 diabetes is managed with diet, exercise, weight loss, and Mounjaro  10 mg weekly. Her most recent hemoglobin A1c was 6.1, indicating good control. She requested a refill of Mounjaro . - Continue Mounjaro  10 mg weekly. - Sent Mounjaro  refill to pharmacy.  Emotional Eating Behaviors Working on being mindful and taking meds more regularly -Continue wellbutrin  BID and will continue to monitor      Patients who are on anti-obesity medications are counseled on the importance of maintaining healthy lifestyle habits, including balanced nutrition, regular physical activity, and behavioral modifications,  Medication is an adjunct to, not a replacement for, lifestyle changes and that the long-term success and weight maintenance depend on continued adherence to these strategies.   Dejai was informed of the importance of frequent follow up visits to maximize her success with intensive lifestyle modifications for her obesity and obesity related health conditions as recommended by USPSTF and CMS guidelines  Louann Penton, MD   "

## 2024-06-04 ENCOUNTER — Other Ambulatory Visit (HOSPITAL_COMMUNITY): Payer: Self-pay

## 2024-07-03 ENCOUNTER — Ambulatory Visit (INDEPENDENT_AMBULATORY_CARE_PROVIDER_SITE_OTHER): Admitting: Family Medicine

## 2024-07-31 ENCOUNTER — Ambulatory Visit (INDEPENDENT_AMBULATORY_CARE_PROVIDER_SITE_OTHER): Admitting: Family Medicine
